# Patient Record
Sex: Female | Born: 1985 | Race: White | Hispanic: No | Smoking: Never smoker
Health system: Southern US, Community
[De-identification: ages and names within clinical notes are randomized; demographics above are authoritative.]

## PROBLEM LIST (undated history)

## (undated) DIAGNOSIS — F53 Postpartum depression: Secondary | ICD-10-CM

## (undated) DIAGNOSIS — O169 Unspecified maternal hypertension, unspecified trimester: Secondary | ICD-10-CM

## (undated) DIAGNOSIS — T7840XA Allergy, unspecified, initial encounter: Secondary | ICD-10-CM

## (undated) DIAGNOSIS — R519 Headache, unspecified: Secondary | ICD-10-CM

## (undated) DIAGNOSIS — M25569 Pain in unspecified knee: Secondary | ICD-10-CM

## (undated) DIAGNOSIS — I10 Essential (primary) hypertension: Secondary | ICD-10-CM

## (undated) DIAGNOSIS — O1490 Unspecified pre-eclampsia, unspecified trimester: Secondary | ICD-10-CM

## (undated) DIAGNOSIS — R112 Nausea with vomiting, unspecified: Secondary | ICD-10-CM

## (undated) DIAGNOSIS — G4700 Insomnia, unspecified: Secondary | ICD-10-CM

## (undated) DIAGNOSIS — N200 Calculus of kidney: Secondary | ICD-10-CM

## (undated) DIAGNOSIS — Z9889 Other specified postprocedural states: Secondary | ICD-10-CM

## (undated) DIAGNOSIS — G4730 Sleep apnea, unspecified: Secondary | ICD-10-CM

## (undated) DIAGNOSIS — Z87442 Personal history of urinary calculi: Secondary | ICD-10-CM

## (undated) DIAGNOSIS — L732 Hidradenitis suppurativa: Secondary | ICD-10-CM

## (undated) DIAGNOSIS — F319 Bipolar disorder, unspecified: Secondary | ICD-10-CM

## (undated) DIAGNOSIS — D649 Anemia, unspecified: Secondary | ICD-10-CM

## (undated) DIAGNOSIS — F419 Anxiety disorder, unspecified: Secondary | ICD-10-CM

## (undated) DIAGNOSIS — E559 Vitamin D deficiency, unspecified: Secondary | ICD-10-CM

## (undated) DIAGNOSIS — F603 Borderline personality disorder: Secondary | ICD-10-CM

## (undated) HISTORY — DX: Unspecified pre-eclampsia, unspecified trimester: O14.90

## (undated) HISTORY — DX: Postpartum depression: F53.0

## (undated) HISTORY — PX: APPENDECTOMY: SHX54

## (undated) HISTORY — PX: OTHER SURGICAL HISTORY: SHX169

## (undated) HISTORY — DX: Anemia, unspecified: D64.9

## (undated) HISTORY — PX: TUBAL LIGATION: SHX77

## (undated) HISTORY — DX: Allergy, unspecified, initial encounter: T78.40XA

## (undated) HISTORY — DX: Unspecified maternal hypertension, unspecified trimester: O16.9

## (undated) HISTORY — DX: Calculus of kidney: N20.0

## (undated) HISTORY — PX: KNEE SURGERY: SHX244

---
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
## 2008-02-11 DIAGNOSIS — O1490 Unspecified pre-eclampsia, unspecified trimester: Secondary | ICD-10-CM

## 2008-04-11 HISTORY — PX: WISDOM TOOTH EXTRACTION: SHX21

## 2008-08-27 ENCOUNTER — Emergency Department (HOSPITAL_COMMUNITY): Admission: EM | Admit: 2008-08-27 | Discharge: 2008-08-27 | Payer: Self-pay | Admitting: Emergency Medicine

## 2010-05-04 LAB — HM PAP SMEAR: HM Pap smear: NEGATIVE

## 2010-07-20 LAB — URINALYSIS, ROUTINE W REFLEX MICROSCOPIC
Hgb urine dipstick: NEGATIVE
Ketones, ur: NEGATIVE mg/dL
Nitrite: NEGATIVE
Specific Gravity, Urine: 1.02 (ref 1.005–1.030)
Urobilinogen, UA: 0.2 mg/dL (ref 0.0–1.0)

## 2010-08-27 NOTE — Op Note (Signed)
   NAME:  STEPHANEY, [name]                           ACCOUNT NO.:  [id number]   MEDICAL RECORD NO.:  [id number]                   PATIENT TYPE:  AMB   LOCATION:  DSC                                  FACILITY:  [facility]   PHYSICIAN:  Thera Flake., M.D.             DATE OF BIRTH:  10/08/1985   DATE OF PROCEDURE:  06/28/2002  DATE OF DISCHARGE:                                 OPERATIVE REPORT   INDICATIONS:  A 25 year old, status post lateral release with persistent  subluxation of her patella, thought to be amenable to overnight  hospitalization and medial plication.   PREOPERATIVE DIAGNOSES:  1. Patellar lateral subluxation.  2. Synovitis.   POSTOPERATIVE DIAGNOSES:  1. Patellar lateral subluxation.  2. Synovitis.   OPERATION:  1. Arthroscopic synovectomy.  2. Open medial plication of right knee.   SURGEON:  Dyke Brackett, M.D.   ANESTHESIA:  General.   DESCRIPTION OF PROCEDURE:  Arthroscope through a superomedial and  inferomedial portal.  The patient had mild synovitis along the  patellofemoral area which was debrided.  The patella itself looked good.  ACL/PCL were normal.  Medial compartment, lateral compartment, menisci were  normal.  She did have mild joint laxity and was thought to have some  characteristics consistent with ligamentive laxity.  The lateral release  seemed to have worked well; however, there was some residual subluxation.  Through a small 5 cm incision, the medial retinaculum and vastus medialis  was taken down over a distance of about 4 cm, embrocated about 1-1.5 cm to  pull the patella medially.  This was done with interrupted mattress sutures  of #2 Ethibond.  Closure was effected with 2-0 Vicryl and Monocryl.  Marcaine with epinephrine infiltrated in the skin and the joint.  Lightly  compressive sterile dressing and knee immobilizer applied, taken to the  recovery room in stable condition.                                               Thera Flake., M.D.    WDC/MEDQ  D:  06/28/2002  T:  06/29/2002  Job:  [id number]
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
## 2010-08-27 NOTE — Op Note (Signed)
[name], [name]                 ACCOUNT NO.:  [id number]   MEDICAL RECORD NO.:  [id number]          PATIENT TYPE:  OBV   LOCATION:  A328                          FACILITY:  [facility]   PHYSICIAN:  Dalia Heading, M.D.  DATE OF BIRTH:  03-05-1986   DATE OF PROCEDURE:  12/30/2004  DATE OF DISCHARGE:                                 OPERATIVE REPORT   PREOPERATIVE DIAGNOSIS:  Chronic appendicitis.   POSTOPERATIVE DIAGNOSIS:  Chronic appendicitis.   PROCEDURE:  Laparoscopic appendectomy.   SURGEON:  Dr. Franky Macho.   ANESTHESIA:  General endotracheal.   INDICATIONS:  The patient is a 25 year old white female who presents with a  several-month history of intermittent right lower quadrant abdominal pain.  She was seen in the emergency room, and a CT scan of the abdomen and pelvis  reveals appendoliths within the appendix, consistent with possible chronic  appendicitis. The patient comes to the operating room for laparoscopic  appendectomy. Risks and benefits of the procedure including bleeding,  infection, and the possibility of open procedure were fully explained to the  patient's mother, who gave informed consent for the patient as the patient  is a minor.   PROCEDURE NOTE:  The patient was placed in the Trendelenburg position. The  abdomen was prepped and draped in the usual sterile technique with Betadine.  Surgical site confirmation was performed.   A supraumbilical incision was made down to the fascia. A Veress needle was  introduced into the abdominal cavity, and confirmation of placement was done  using the saline drop test. The abdomen was then insufflated to 16 mmHg  pressure. An 11-mm trocar was introduced into the abdominal cavity under  direct visualization without difficulty. An additional 12-mm trocar was  placed in the suprapubic region. A 5-mm trocar was placed in left lower  quadrant region. The appendix was visualized and noted to be somewhat  dilated and  injected. The uterus and ovaries were within normal limits.  There was no other pathology found in the right lower quadrant. The  mesoappendix was divided using the harmonic scalpel. A vascular Endo-GIA was  placed across the base of the appendix and fired. The staple line was  intact. The appendix was removed using an EndoCatch bag without difficulty.  All fluid and air were then evacuate from the abdominal cavity prior to  removal of the trocars.   All wounds were irrigated normal saline. All wounds were injected with 0.5%  Sensorcaine. The suprapubic fascia was reapproximated using a 0 Vicryl  interrupted suture. All skin incisions were closed using staples. Betadine  ointment and dry sterile dressings were applied.   All tape and needle counts were correct at the end of the procedure. The  patient was extubated in the operating room and went back to recovery room  awake in stable condition.   COMPLICATIONS:  None.   SPECIMEN:  Appendix.   BLOOD LOSS:  Minimal.      Dalia Heading, M.D.  Electronically Signed     MAJ/MEDQ  D:  12/30/2004  T:  12/30/2004  Job:  [id number] 

## 2010-08-27 NOTE — H&P (Signed)
[name], [name]                 ACCOUNT NO.:  [id number]   MEDICAL RECORD NO.:  [id number]          PATIENT TYPE:  OBV   LOCATION:  A328                          FACILITY:  [facility]   PHYSICIAN:  Dalia Heading, M.D.  DATE OF BIRTH:  [birth year]   DATE OF ADMISSION:  12/29/2004  DATE OF DISCHARGE:  LH                                HISTORY & PHYSICAL   CHIEF COMPLAINT:  Right lower quadrant abdominal pain.   HISTORY OF PRESENT ILLNESS:  The patient is a 25 year old white female who  presents to the emergency room with worsening right lower quadrant abdominal  pain.  She has had multiple episodes of right lower quadrant abdominal pain  since May 2006.  She has been seen by urology and other physicians in the  past.  A CT scan of the abdomen and pelvis was performed in the emergency  room which revealed appendoliths, consistent with probable chronic  appendicitis.  She has mild nausea, but no vomiting.   PAST MEDICAL HISTORY:  As noted above.   PAST SURGICAL HISTORY:  Multiple knee surgeries.   CURRENT MEDICATIONS:  1.  Zyrtec.  2.  Ortho-Novum.  3.  Vicodin.   ALLERGIES:  No known drug allergies.   REVIEW OF SYSTEMS:  Noncontributory.   PHYSICAL EXAMINATION:  GENERAL:  The patient is a well-developed, well-  nourished white female in no acute distress.  VITAL SIGNS:  She is afebrile, vital signs are stable.  LUNGS:  Clear to auscultation with equal breath sounds bilaterally.  HEART:  Regular rate and rhythm without S3, S4, or murmurs.  ABDOMEN:  Soft with tenderness down the right lower quadrant to palpation.  No hepatosplenomegaly, masses, or hernias are identified.  No rigidity is  noted.   LABORATORY DATA:  CBC within normal limits.  Met-7 within normal limits.  Urine pregnancy test is negative.   IMPRESSION:  Right lower quadrant abdominal pain, recurrent, consistent with  recurrent chronic appendicitis with fecaliths.   PLAN:  The patient is going to be admitted to  the hospital for pain and  nausea control.  She will also undergo a laparoscopic appendectomy.  The  risks and benefits of the procedure, including bleeding, infection, and the  possibility of an open procedure were fully explained to the patient, who  gave informed consent.      Dalia Heading, M.D.  Electronically Signed     MAJ/MEDQ  D:  12/29/2004  T:  12/29/2004  Job:  [id number]   cc:   Short Stay at [facility]
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
## 2010-08-27 NOTE — Op Note (Signed)
NAME:  [name], [name]                           ACCOUNT NO.:  [id number]   MEDICAL RECORD NO.:  [id number]                   PATIENT TYPE:  AMB   LOCATION:  DSC                                  FACILITY:  [facility]   PHYSICIAN:  Thera Flake., M.D.             DATE OF BIRTH:  Aug 22, 1985   DATE OF PROCEDURE:  12/28/2001  DATE OF DISCHARGE:  12/28/2001                                 OPERATIVE REPORT   PREOPERATIVE DIAGNOSIS:  Lateral patellar subluxation.   POSTOPERATIVE DIAGNOSES:  1. Lateral patellar subluxation.  2. Lateral meniscus tear.   PROCEDURES:  1. Partial lateral meniscectomy.  2. Arthroscopic lateral release.   SURGEON:  Dyke Brackett, M.D.   ANESTHESIA:  General.   TOURNIQUET TIME:  Approximately 15 minutes.   INDICATIONS:  This is a 25 year old with a history of patellar subluxation  thought to be amenable to arthroscopy release, possible medial plication.   DESCRIPTION OF PROCEDURE:  She was arthroscoped through an inferior medial  and inferior lateral superomedial portal.  There was a small fraying-type  tear of the lateral meniscus, possibly thought to be responsible for some  symptoms that the patient was having.  This was unexpected based on the  absence of joint line tenderness.  This was estimated at about 10-15% of the  meniscus substance that was resected.  There was a large popliteal hiatus,  but the tear itself was more of a fraying-type tear along the thinnest  margin of the meniscus.  The medial meniscus, ACL, and PCL were normal.  The  patella showed some mild overhang, about one-third the diameter of the  patella.  Exam under anesthesia showed no real frank tendency to dislocate.  Preoperative patellar view was normal.  There was no significant chondral  injury relative to the patient's history.  Again it was equivocal based on  the absence of any objective x-ray or ER data.  The patient had been  suffering a true dislocation versus a  subluxation.  In the absence of  significant intraoperative findings consistent with significant trauma and  the fact that the patellar view was normal, it was elected to do a lateral  release without a medial plication.  This was accomplished with the  ArthroCare wand.  It relieved pressure in the overhang at least by 50%.  The  portals were removed.  The arthroscopy of the inferolateral portal had been  slightly increased in size.  It was closed with two stitches, followed by  one stitch each on the other two portals, infiltrated with Marcaine and  morphine into the joint, into the lateral release site.  A lightly  compressive sterile dressing applied and a knee immobilizer.  Taken to the  recovery room in stable condition.  Thera Flake., M.D.    WDC/MEDQ  D:  12/28/2001  T:  12/31/2001  Job:  [id number]

## 2012-01-01 ENCOUNTER — Encounter (HOSPITAL_COMMUNITY): Payer: Self-pay

## 2012-01-01 ENCOUNTER — Emergency Department (HOSPITAL_COMMUNITY): Payer: Medicaid Other

## 2012-01-01 ENCOUNTER — Emergency Department (HOSPITAL_COMMUNITY)
Admission: EM | Admit: 2012-01-01 | Discharge: 2012-01-01 | Disposition: A | Discharge status: Home / Self Care | Payer: Medicaid Other | Attending: Emergency Medicine | Admitting: Emergency Medicine

## 2012-01-01 DIAGNOSIS — IMO0002 Reserved for concepts with insufficient information to code with codable children: Secondary | ICD-10-CM | POA: Insufficient documentation

## 2012-01-01 DIAGNOSIS — S86911A Strain of unspecified muscle(s) and tendon(s) at lower leg level, right leg, initial encounter: Secondary | ICD-10-CM

## 2012-01-01 HISTORY — DX: Pain in unspecified knee: M25.569

## 2012-01-01 MED ORDER — HYDROCODONE-ACETAMINOPHEN 5-325 MG PO TABS
1.0000 | ORAL_TABLET | Freq: Once | ORAL | Status: AC
  Administered 2012-01-01: 1 via ORAL
  Filled 2012-01-01: qty 1

## 2012-01-01 MED ORDER — HYDROCODONE-ACETAMINOPHEN 5-325 MG PO TABS: 1.0000 | ORAL_TABLET | ORAL | Status: DC | PRN

## 2012-01-01 NOTE — ED Notes (Signed)
Pt states that her right knee "popped" out of place this past Thursday, pt was sitting in chair when incident occurred, denies any injury, was able to get the knee "popped" back into place, still continues to have pain. Cms intact distal,

## 2012-01-01 NOTE — ED Notes (Signed)
Pt reports right knee surgery several years ago, cont. To have problems w/ knee "slipping out", slipped out on Thursday and she now cont. To have pain.
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
## 2012-01-05 NOTE — ED Provider Notes (Signed)
Medical screening examination/treatment/procedure(s) were performed by non-physician practitioner and as supervising physician I was immediately available for consultation/collaboration.   Glynn Octave, MD 01/05/12 1041

## 2013-02-21 ENCOUNTER — Encounter (HOSPITAL_COMMUNITY): Payer: Self-pay | Admitting: Emergency Medicine

## 2013-02-21 ENCOUNTER — Emergency Department (HOSPITAL_COMMUNITY)
Admission: EM | Admit: 2013-02-21 | Discharge: 2013-02-21 | Disposition: A | Discharge status: Home / Self Care | Payer: Medicaid Other | Attending: Emergency Medicine | Admitting: Emergency Medicine

## 2013-02-21 DIAGNOSIS — L299 Pruritus, unspecified: Secondary | ICD-10-CM | POA: Insufficient documentation

## 2013-02-21 DIAGNOSIS — R300 Dysuria: Secondary | ICD-10-CM | POA: Insufficient documentation

## 2013-02-21 DIAGNOSIS — N898 Other specified noninflammatory disorders of vagina: Secondary | ICD-10-CM | POA: Insufficient documentation

## 2013-02-21 LAB — URINALYSIS, ROUTINE W REFLEX MICROSCOPIC
Nitrite: NEGATIVE
Protein, ur: NEGATIVE mg/dL
Specific Gravity, Urine: 1.02 (ref 1.005–1.030)
Urobilinogen, UA: 0.2 mg/dL (ref 0.0–1.0)

## 2013-02-21 MED ORDER — FLUCONAZOLE 100 MG PO TABS
200.0000 mg | ORAL_TABLET | Freq: Once | ORAL | Status: AC
  Administered 2013-02-21: 200 mg via ORAL
  Filled 2013-02-21: qty 2

## 2013-02-21 MED ORDER — NYSTATIN-TRIAMCINOLONE 100000-0.1 UNIT/GM-% EX CREA: TOPICAL_CREAM | CUTANEOUS | Status: DC

## 2013-02-21 NOTE — ED Provider Notes (Signed)
CSN: [id number]     Arrival date & time 02/21/13  7829 History   First MD Initiated Contact with Patient 02/21/13 0920     No chief complaint on file.  (Consider location/radiation/quality/duration/timing/severity/associated sxs/prior Treatment) Patient is a 27 y.o. female presenting with vaginal discharge. The history is provided by the patient.  Vaginal Discharge Quality:  White and thin Onset quality:  Gradual Duration:  8 days Timing:  Constant Progression:  Worsening Chronicity:  New Context: after intercourse   Relieved by:  Nothing Ineffective treatments:  OTC medications Associated symptoms: dysuria and vaginal itching   Associated symptoms: no abdominal pain, no dyspareunia, no fever, no genital lesions, no nausea, no rash, no urinary frequency, no urinary hesitancy, no urinary incontinence and no vomiting   Risk factors: gynecological surgery and new sexual partner   Risk factors: no endometriosis, no immunosuppression, no PID, no STI exposure and no unprotected sex    MELVIN MARMO is a 27 y.o. female who presents to the ED with vaginal discharge that started last week after using a condom and having sex with a new partner. She has had irritation from condoms in the past but usually clears up in a day or so but not this time. Yesterday began having burning on urination in addition to the discharge. Used Azo but no relief.   Past Medical History  Diagnosis Date  . Knee pain    Past Surgical History  Procedure Laterality Date  . Knee surgery    . Appendectomy    . Cesarean section     No family history on file. History  Substance Use Topics  . Smoking status: Never Smoker   . Smokeless tobacco: Not on file  . Alcohol Use: No   OB History   Grav Para Term Preterm Abortions TAB SAB Ect Mult Living                 Review of Systems  Constitutional: Negative for fever and chills.  HENT: Negative.   Eyes: Negative for redness.  Respiratory: Negative for cough  and shortness of breath.   Cardiovascular: Negative for chest pain.  Gastrointestinal: Negative for nausea, vomiting and abdominal pain.  Genitourinary: Positive for dysuria and vaginal discharge. Negative for bladder incontinence, hesitancy and dyspareunia. Vaginal pain: itching.  Musculoskeletal: Negative for back pain.  Skin: Negative for rash.  Neurological: Negative for dizziness and headaches.  Psychiatric/Behavioral: The patient is not nervous/anxious.     Allergies  Review of patient's allergies indicates no known allergies.  Home Medications   Current Outpatient Rx  Name  Route  Sig  Dispense  Refill  . HYDROcodone-acetaminophen (NORCO/VICODIN) 5-325 MG per tablet   Oral   Take 1 tablet by mouth every 4 (four) hours as needed for pain.   20 tablet   0   . ibuprofen (ADVIL,MOTRIN) 200 MG tablet   Oral   Take 800 mg by mouth every 6 (six) hours as needed. For pain          There were no vitals taken for this visit. Physical Exam  Nursing note and vitals reviewed. Constitutional: She is oriented to person, place, and time. She appears well-developed and well-nourished.  HENT:  Head: Normocephalic and atraumatic.  Eyes: Conjunctivae and EOM are normal.  Neck: Neck supple.  Cardiovascular: Normal rate.   Pulmonary/Chest: Effort normal.  Abdominal: Soft. There is no tenderness. There is no CVA tenderness.  Genitourinary:  External genitalia with erythema and irritation. Thick white discharge  vaginal vault. No CMT, no adnexal tenderness, uterus without palpable enlargement.   Musculoskeletal: Normal range of motion.  Neurological: She is alert and oriented to person, place, and time. No cranial nerve deficit.  Skin: Skin is warm and dry.  Psychiatric: She has a normal mood and affect. Her behavior is normal.   Results for orders placed during the hospital encounter of 02/21/13 (from the past 24 hour(s))  URINALYSIS, ROUTINE W REFLEX MICROSCOPIC     Status: None    Collection Time    02/21/13  9:46 AM      Result Value Range   Color, Urine YELLOW  YELLOW   APPearance CLEAR  CLEAR   Specific Gravity, Urine 1.020  1.005 - 1.030   pH 6.5  5.0 - 8.0   Glucose, UA NEGATIVE  NEGATIVE mg/dL   Hgb urine dipstick NEGATIVE  NEGATIVE   Bilirubin Urine NEGATIVE  NEGATIVE   Ketones, ur NEGATIVE  NEGATIVE mg/dL   Protein, ur NEGATIVE  NEGATIVE mg/dL   Urobilinogen, UA 0.2  0.0 - 1.0 mg/dL   Nitrite NEGATIVE  NEGATIVE   Leukocytes, UA NEGATIVE  NEGATIVE  WET PREP, GENITAL     Status: Abnormal   Collection Time    02/21/13  9:46 AM      Result Value Range   Yeast Wet Prep HPF POC NONE SEEN  NONE SEEN   Trich, Wet Prep NONE SEEN  NONE SEEN   Clue Cells Wet Prep HPF POC NONE SEEN  NONE SEEN   WBC, Wet Prep HPF POC TOO NUMEROUS TO COUNT (*) NONE SEEN    ED Course  Procedures   MDM  28 y.o. female with vaginal irritation, discharge and dysuria. Will treat with Diflucan and Mycolog cream. Patient to follow up with her GYN or return here as needed.  Discussed with the patient clinical findings and plan of care. All questioned fully answered.   Medication List    TAKE these medications       nystatin-triamcinolone cream  Commonly known as:  MYCOLOG II  Apply to affected area daily      ASK your doctor about these medications       HYDROcodone-acetaminophen 5-325 MG per tablet  Commonly known as:  NORCO/VICODIN  Take 1 tablet by mouth every 4 (four) hours as needed for pain.     ibuprofen 200 MG tablet  Commonly known as:  ADVIL,MOTRIN  Take 800 mg by mouth every 6 (six) hours as needed. For pain            Janne Napoleon, NP 02/21/13 1413

## 2013-02-21 NOTE — ED Notes (Signed)
White Vaginal discharge starting yesterday with painful urination.  Reports has allergy to condoms and used condom on Tuesday and sx began on Wednesday.

## 2013-02-21 NOTE — ED Provider Notes (Signed)
Medical screening examination/treatment/procedure(s) were performed by non-physician practitioner and as supervising physician I was immediately available for consultation/collaboration.  EKG Interpretation   None       Devoria Albe, MD, Armando Gang   Ward Givens, MD 02/21/13 [phone]

## 2014-09-11 ENCOUNTER — Other Ambulatory Visit: Payer: Self-pay | Admitting: Nurse Practitioner

## 2014-09-11 DIAGNOSIS — R102 Pelvic and perineal pain: Secondary | ICD-10-CM

## 2014-09-12 ENCOUNTER — Other Ambulatory Visit: Payer: Self-pay

## 2014-09-17 ENCOUNTER — Ambulatory Visit
Admission: RE | Admit: 2014-09-17 | Discharge: 2014-09-17 | Disposition: A | Discharge status: Home / Self Care | Payer: No Typology Code available for payment source | Source: Ambulatory Visit | Attending: Nurse Practitioner | Admitting: Nurse Practitioner

## 2014-09-17 DIAGNOSIS — R102 Pelvic and perineal pain: Secondary | ICD-10-CM

## 2015-09-17 ENCOUNTER — Emergency Department (HOSPITAL_COMMUNITY): Payer: Medicaid Other

## 2015-09-17 ENCOUNTER — Encounter (HOSPITAL_COMMUNITY): Payer: Self-pay

## 2015-09-17 ENCOUNTER — Emergency Department (HOSPITAL_COMMUNITY)
Admission: EM | Admit: 2015-09-17 | Discharge: 2015-09-17 | Disposition: A | Discharge status: Home / Self Care | Payer: Medicaid Other | Attending: Emergency Medicine | Admitting: Emergency Medicine

## 2015-09-17 DIAGNOSIS — I10 Essential (primary) hypertension: Secondary | ICD-10-CM | POA: Diagnosis not present

## 2015-09-17 DIAGNOSIS — Z7982 Long term (current) use of aspirin: Secondary | ICD-10-CM | POA: Insufficient documentation

## 2015-09-17 DIAGNOSIS — Z79899 Other long term (current) drug therapy: Secondary | ICD-10-CM | POA: Insufficient documentation

## 2015-09-17 DIAGNOSIS — R519 Headache, unspecified: Secondary | ICD-10-CM

## 2015-09-17 DIAGNOSIS — R202 Paresthesia of skin: Secondary | ICD-10-CM | POA: Diagnosis not present

## 2015-09-17 DIAGNOSIS — Z3A14 14 weeks gestation of pregnancy: Secondary | ICD-10-CM | POA: Insufficient documentation

## 2015-09-17 DIAGNOSIS — Z791 Long term (current) use of non-steroidal anti-inflammatories (NSAID): Secondary | ICD-10-CM | POA: Diagnosis not present

## 2015-09-17 DIAGNOSIS — R51 Headache: Secondary | ICD-10-CM | POA: Diagnosis not present

## 2015-09-17 DIAGNOSIS — O26891 Other specified pregnancy related conditions, first trimester: Secondary | ICD-10-CM | POA: Diagnosis present

## 2015-09-17 DIAGNOSIS — Z3490 Encounter for supervision of normal pregnancy, unspecified, unspecified trimester: Secondary | ICD-10-CM

## 2015-09-17 HISTORY — DX: Essential (primary) hypertension: I10

## 2015-09-17 LAB — URINE MICROSCOPIC-ADD ON

## 2015-09-17 LAB — URINALYSIS, ROUTINE W REFLEX MICROSCOPIC
Bilirubin Urine: NEGATIVE
GLUCOSE, UA: NEGATIVE mg/dL
Ketones, ur: NEGATIVE mg/dL
Nitrite: NEGATIVE
PH: 6 (ref 5.0–8.0)
Protein, ur: NEGATIVE mg/dL
Specific Gravity, Urine: 1.015 (ref 1.005–1.030)

## 2015-09-17 LAB — COMPREHENSIVE METABOLIC PANEL
ALK PHOS: 44 U/L (ref 38–126)
ALT: 20 U/L (ref 14–54)
AST: 17 U/L (ref 15–41)
Albumin: 3.6 g/dL (ref 3.5–5.0)
Anion gap: 5 (ref 5–15)
BUN: <5 mg/dL — ABNORMAL LOW (ref 6–20)
CALCIUM: 9 mg/dL (ref 8.9–10.3)
CO2: 22 mmol/L (ref 22–32)
Chloride: 107 mmol/L (ref 101–111)
Creatinine, Ser: 0.51 mg/dL (ref 0.44–1.00)
GFR calc non Af Amer: >60 mL/min (ref >60)
GLUCOSE: 72 mg/dL (ref 65–99)
POTASSIUM: 3.7 mmol/L (ref 3.5–5.1)
SODIUM: 134 mmol/L — AB (ref 135–145)
Total Bilirubin: 0.3 mg/dL (ref 0.3–1.2)
Total Protein: 7 g/dL (ref 6.5–8.1)

## 2015-09-17 LAB — CBC WITH DIFFERENTIAL/PLATELET
Basophils Absolute: 0 10*3/uL (ref 0.0–0.1)
Basophils Relative: 0 %
Eosinophils Absolute: 0 10*3/uL (ref 0.0–0.7)
Eosinophils Relative: 1 %
HEMATOCRIT: 35.2 % — AB (ref 36.0–46.0)
HEMOGLOBIN: 12.2 g/dL (ref 12.0–15.0)
LYMPHS ABS: 1.8 10*3/uL (ref 0.7–4.0)
LYMPHS PCT: 33 %
MCH: 31.9 pg (ref 26.0–34.0)
MCHC: 34.7 g/dL (ref 30.0–36.0)
MCV: 92.1 fL (ref 78.0–100.0)
Monocytes Absolute: 0.4 10*3/uL (ref 0.1–1.0)
Monocytes Relative: 6 %
NEUTROS ABS: 3.4 10*3/uL (ref 1.7–7.7)
NEUTROS PCT: 60 %
Platelets: 287 10*3/uL (ref 150–400)
RBC: 3.82 MIL/uL — AB (ref 3.87–5.11)
RDW: 13.2 % (ref 11.5–15.5)
WBC: 5.6 10*3/uL (ref 4.0–10.5)

## 2015-09-17 LAB — TSH: TSH: 0.818 u[IU]/mL (ref 0.350–4.500)

## 2015-09-17 LAB — SEDIMENTATION RATE: Sed Rate: 32 mm/hr — ABNORMAL HIGH (ref 0–22)

## 2015-09-17 LAB — PROTEIN / CREATININE RATIO, URINE
Creatinine, Urine: 124.16 mg/dL
PROTEIN CREATININE RATIO: 0.13 mg/mg{creat} (ref 0.00–0.15)
Total Protein, Urine: 16 mg/dL

## 2015-09-17 MED ORDER — PROCHLORPERAZINE EDISYLATE 5 MG/ML IJ SOLN
10.0000 mg | Freq: Once | INTRAMUSCULAR | Status: AC
  Administered 2015-09-17: 10 mg via INTRAVENOUS
  Filled 2015-09-17: qty 2

## 2015-09-17 MED ORDER — DIPHENHYDRAMINE HCL 50 MG/ML IJ SOLN
25.0000 mg | Freq: Once | INTRAMUSCULAR | Status: AC
Start: 2015-09-17 — End: 2015-09-17
  Administered 2015-09-17: 25 mg via INTRAVENOUS
  Filled 2015-09-17: qty 1

## 2015-09-17 MED ORDER — NITROFURANTOIN MONOHYD MACRO 100 MG PO CAPS: 100.0000 mg | ORAL_CAPSULE | Freq: Two times a day (BID) | ORAL | Status: DC

## 2015-09-17 MED ORDER — TROPICAMIDE 1 % OP SOLN
1.0000 [drp] | Freq: Once | OPHTHALMIC | Status: DC
  Filled 2015-09-17: qty 3

## 2015-09-17 MED ORDER — METOCLOPRAMIDE HCL 5 MG/ML IJ SOLN
10.0000 mg | Freq: Once | INTRAMUSCULAR | Status: AC
  Administered 2015-09-17: 10 mg via INTRAVENOUS
  Filled 2015-09-17: qty 2

## 2015-09-17 NOTE — ED Provider Notes (Signed)
CSN: [id number]     Arrival date & time 09/17/15  1038 History   By signing my name below, I, Evon Slackerrance Branch, attest that this documentation has been prepared under the direction and in the presence of Glynn OctaveStephen Necia Kamm, MD. Electronically Signed: Evon Slackerrance Branch, ED Scribe. 09/17/2015. 11:38 AM.    No chief complaint on file.  The history is provided by the patient. No language interpreter was used.   HPI Comments: [name] is a 30 y.o. female who presents to the Emergency Department complaining of throbbing right sided HA onset 1 day prior. Pt reports that she is seeing black "squiggly lines" in her vision.  Denies loss of vision. Headache is diffuse and gradual in onset.  Pt reports that she has been having intermittent numbness on the right side of her body that began 6 days prior. Had episode six days ago that lasted several hours and another episode yesterday that lasted all day. Pt states that she was [facility]. She states that she had an MRI completed 1 day prior that showed no abnormalities. Pt states that she is [redacted] weeks pregnant. Pt states that she had the baby evaluated yesterday at [facility]. Pt states that her BP has been fluctuating as well. Pt states that her BP will fluctuate from 140/91 and 105/60. Pt states that she has a hx of preeclampsia and that her symptoms feel similar. Pt also reports some dysuria. Pt deosnt report any medications PTA. Pt doesn't report any alleviating or worsening factors. Denies vaginal bleeding, vaginal discharge, abdominal pain CP or SOB. Pt denies numbness today.    Past Medical History  Diagnosis Date  . Knee pain   . Hypertension    Past Surgical History  Procedure Laterality Date  . Knee surgery    . Appendectomy    . Cesarean section     No family history on file. Social History  Substance Use Topics  . Smoking status: Never Smoker   . Smokeless tobacco: None  . Alcohol Use: No   OB History    Gravida Para Term  Preterm AB TAB SAB Ectopic Multiple Living   1               Review of Systems  Eyes: Positive for visual disturbance.  Respiratory: Negative for shortness of breath.   Cardiovascular: Negative for chest pain.  Gastrointestinal: Negative for abdominal pain.  Genitourinary: Negative for vaginal bleeding and vaginal discharge.  Neurological: Positive for headaches.  All other systems reviewed and are negative.     Allergies  Keflex and Latex  Home Medications   Prior to Admission medications   Medication Sig Start Date End Date Taking? Authorizing Provider  HYDROcodone-acetaminophen (NORCO/VICODIN) 5-325 MG per tablet Take 1 tablet by mouth every 4 (four) hours as needed for pain. 01/01/12   Burgess AmorJulie Idol, PA-C  ibuprofen (ADVIL,MOTRIN) 200 MG tablet Take 800 mg by mouth every 6 (six) hours as needed. For pain    Historical Provider, MD  nystatin-triamcinolone (MYCOLOG II) cream Apply to affected area daily 02/21/13   Hope Orlene OchM Neese, NP   BP 116/75 mmHg  Pulse 83  Temp(Src) 99 F (37.2 C) (Tympanic)  Resp 18  Ht 5\' 6"  (1.676 m)  Wt 220 lb (99.791 kg)  BMI 35.53 kg/m2  SpO2 100%  LMP 06/13/2015   Physical Exam  Constitutional: She is oriented to person, place, and time. She appears well-developed and well-nourished. No distress.  HENT:  Head: Normocephalic and atraumatic.  Mouth/Throat: Oropharynx is clear and moist. No oropharyngeal exudate.  Eyes: Conjunctivae and EOM are normal. Pupils are equal, round, and reactive to light. Right eye exhibits no nystagmus. Left eye exhibits no nystagmus.  Fundoscopic exam:      The right eye shows no papilledema.       The left eye shows no papilledema.  No appreciable papilledema on limited exam  Neck: Normal range of motion. Neck supple.  No meningismus.  Cardiovascular: Normal rate, regular rhythm, normal heart sounds and intact distal pulses.   No murmur heard. Pulmonary/Chest: Effort normal and breath sounds normal. No  respiratory distress.  Abdominal: Soft. There is no tenderness. There is no rebound and no guarding.  Gravid abdomen.  Musculoskeletal: Normal range of motion. She exhibits no edema or tenderness.  Neurological: She is alert and oriented to person, place, and time. No cranial nerve deficit. She exhibits normal muscle tone. Coordination normal.  No ataxia on finger to nose bilaterally. No pronator drift. 5/5 strength throughout. CN 2-12 intact.Equal grip strength. Sensation intact throughout.  No visual field cut. No nystagmus.  Skin: Skin is warm.  Psychiatric: She has a normal mood and affect. Her behavior is normal.  Nursing note and vitals reviewed.   ED Course  Procedures (including critical care time) DIAGNOSTIC STUDIES: Oxygen Saturation is 100% on RA, normal by my interpretation.    COORDINATION OF CARE: 11:38 AM-Discussed treatment plan with pt at bedside and pt agreed to plan.     Labs Review Labs Reviewed  URINALYSIS, ROUTINE W REFLEX MICROSCOPIC (NOT AT [facility]) - Abnormal; Notable for the following:    APPearance HAZY (*)    Hgb urine dipstick TRACE (*)    Leukocytes, UA SMALL (*)    All other components within normal limits  URINE MICROSCOPIC-ADD ON - Abnormal; Notable for the following:    Squamous Epithelial / LPF 0-5 (*)    Bacteria, UA MANY (*)    All other components within normal limits  PROTEIN / CREATININE RATIO, URINE  CBC WITH DIFFERENTIAL/PLATELET  COMPREHENSIVE METABOLIC PANEL    Imaging Review Mr Angiogram Head Wo Contrast  09/17/2015  CLINICAL DATA:  30 year old female with right face and upper extremity numbness. Speech and visual disturbance. Initial encounter. EXAM: MRA HEAD WITHOUT CONTRAST TECHNIQUE: Angiographic images of the Circle of Willis were obtained using MRA technique without intravenous contrast. COMPARISON:  Intracranial MRA 1202 hours today. Brain MRI 09/16/2015. Neck MRA from today reported separately. FINDINGS: Antegrade flow in  codominant distal vertebral arteries which are tortuous but otherwise normal to the vertebrobasilar junction. Normal right PICA and dominant appearing left AICA origins. Mildly tortuous proximal basilar artery. Otherwise normal basilar artery. Normal SCA and PCA origins. Posterior communicating arteries are diminutive or absent. Bilateral PCA branches are normal. Antegrade flow in both ICA siphons. No siphon stenosis. Ophthalmic artery origins are normal. Carotid termini are normal. MCA and ACA origins are normal. Anterior communicating artery and visualized ACA branches are within normal limits. MCA M1 segments, bifurcations, and visualized MCA branches are within normal limits. Incidental are early right M1 bifurcation (normal variant). No intracranial mass effect or ventriculomegaly. Normal cerebral volume. IMPRESSION: Negative intracranial MRA. Electronically Signed   By: Odessa Fleming M.D.   On: 09/17/2015 16:12   Mr Angiogram Neck Wo Contrast  09/17/2015  CLINICAL DATA:  30 year old female with right face and upper extremity numbness. Speech and visual disturbance. Initial encounter. EXAM: MRA NECK WITHOUT CONTRAST TECHNIQUE: Angiographic images of the neck were obtained  using MRA technique without intravenous contrast. Carotid stenosis measurements (when applicable) are obtained utilizing NASCET criteria, using the distal internal carotid diameter as the denominator. COMPARISON:  Intracranial MRV 1202 hours today. Brain MRI 09/16/2015. Intracranial MRA today reported separately. FINDINGS: 3D time-of-flight imaging in the neck reveals antegrade flow in both carotid and vertebral arteries from the aortic arch to the skullbase. Three vessel arch configuration. Great vessel origins appear normal. Codominant vertebral arteries appear normal to the skullbase. Bilateral common carotid arteries, carotid bifurcations and cervical ICAs are normal. Grossly negative visualized neck soft tissues; there is mild thyromegaly  with no discrete thyroid nodule evident. IMPRESSION: Negative noncontrast neck MRA. Electronically Signed   By: Odessa Fleming M.D.   On: 09/17/2015 16:09   Mr Alexandria Lodge  09/17/2015  CLINICAL DATA:  30 year old female with right face and upper extremity numbness. Speech and visual disturbance. Initial encounter. EXAM: MR VENOGRAM of the HEAD WITHOUT CONTRAST TECHNIQUE: Angiographic images of the intracranial venous structures were obtained using MRV technique without intravenous contrast. COMPARISON:  Brain MRI without contrast 09/16/2015. FINDINGS: Preserved flow signal in the superior sagittal sinus, torcula, straight sinus, and vein of Galen. Dominant appearing right transverse sinus, but preserved flow signal in both transverse and sigmoid sinuses. Preserved flow signal in the dominant right IJ bulb. IMPRESSION: Negative intracranial MRV; no evidence of dural sinus thrombosis. Electronically Signed   By: Odessa Fleming M.D.   On: 09/17/2015 12:47   I have personally reviewed and evaluated these images and lab results as part of my medical decision-making.   EKG Interpretation None      MDM   Final diagnoses:  Headache  Paresthesia  Pregnancy  Patient states she is [redacted] weeks pregnant by ultrasound and dates. Developed right-sided numbness 6 days ago which improved but then recurred yesterday. Was seen at outside hospital yesterday with negative MRI. Called her OB this morning with worsening headache and vision changes. She was seen at the [facility] clinic yesterday and told the baby was okay.  Neuro exam today is nonfocal.  C/o headache and blurry vision. No fever. BP controlled in the ED.  MRI report received from [facility] yesterday. Normal MRI of the brain. No evidence of infarction or demyelinating disease.  D/w Dr. Vincenza Hews of [facility] at [facility].  Dr. Vincenza Hews agrees the patient does not have preeclampsia. She is less than 20 weeks. Her blood pressure is normal in the ED.  Laboratory studies are normal. There is no proteinuria. Urine protein creatinine ratio is normal.  D/w Dr. Amada Jupiter of neurology.  Recommends treatment for complicated migraine. Also recommends teleneurology assessment.  Does not recommend further imaging. States pseudotumor cerebri should be considered but if headache resolved with migraine cocktail, that is reassuring.  Neurology consult complete by Dr. Dareen Piano. He recommends dilated funduscopic exam assessing for hemorrhage. He also recommends MRA of head and neck. He was requesting additional lab work that was ordered. He does not feel lumbar puncture is indicated and does not feel presentation is consistent with pseudotumor cerebri at this time unless patient has obvious papilledema on exam.  MRA negative.  MRV negative. Headache and blurry vision has resolved with reglan and benadryl.  No obvious papilledema on exam, but limited even with dilation. Patient feels back to baseline, headache has resolved and vision is normal.   Advised close followup with OB and neurology. Would also benefit from formal ophthalmology evaluation. Urine culture sent.  Treat possible UTI.  BP controlled in the  ED. Labs reassuring. No evidence of preeclampsia, CVA, demyelinating disease.   EMERGENCY DEPARTMENT [place] PREGNANCY "Study: Limited Ultrasound of the Pelvis"  INDICATIONS:pregnant Multiple views of the uterus and pelvic cavity are obtained with a multi-frequency probe.  APPROACH:transabdominal  PERFORMED BY: myself  IMAGES ARCHIVED?: yes  LIMITATIONS: body habitus  PREGNANCY FREE FLUID: none  PREGNANCY UTERUS FINDINGS: IUP, +fetal movement, +FHT  PREGNANCY FINDINGS: IUP  INTERPRETATION: IUP, +fetal movement.  FHR difficult to calculate but fluttering seen.Marland Kitchen approx 167 bpm.      I personally performed the services described in this documentation, which was scribed in my presence. The recorded information has been reviewed and is  accurate.       Glynn Octave, MD 09/17/15 [phone]

## 2015-09-17 NOTE — ED Notes (Signed)
Pt reports had numbness on r side of body Friday and went to [facility] er.  Pt says was told she may have had a TIA but did not do a ct scan because pt pregnant and no one was there to do an MRI.  Yesterday morning around 0730 she started having numbness in r side of her face.  She says her bp was 128/108.  Pt called ems for eval then she drove to [facility] and was able to get the MRI.  Pt says the MRI was normal.  Pt says she has been having headaches and her bp is fluctuating.   Pt called her OBGYN and they told her to go to [facility] yesterday.  Reports they checked the baby out but pt did not want to wait in the ER for her to be evaluated because it was a 5 hour wait.  Pt says today she isn't numb but has a headache, blurred vision, and is seeing spots.

## 2015-09-17 NOTE — ED Notes (Signed)
Unable to assess FHT with doppler, pt reports only the Doctor has been able to find them with the ultrasound due to her size.  Bedside ultrasound used and found briefly, but not sustained enough to count.  Good fetal movement shown on ultrasound.  Dr. Manus Gunningancour made aware and states he will attempt to assess.

## 2015-09-17 NOTE — Discharge Instructions (Signed)
Paresthesia Followup with the neurologist and your OB. Also follow up with your eye doctor for a formal eye exam. Return to the ED if you develop new or worsening symptoms. Paresthesia is an abnormal burning or prickling sensation. This sensation is generally felt in the hands, arms, legs, or feet. However, it may occur in any part of the body. Usually, it is not painful. The feeling may be described as:  Tingling or numbness.  Pins and needles.  Skin crawling.  Buzzing.  Limbs falling asleep.  Itching. Most people experience temporary (transient) paresthesia at some time in their lives. Paresthesia may occur when you breathe too quickly (hyperventilation). It can also occur without any apparent cause. Commonly, paresthesia occurs when pressure is placed on a nerve. The sensation quickly goes away after the pressure is removed. For some people, however, paresthesia is a long-lasting (chronic) condition that is caused by an underlying disorder. If you continue to have paresthesia, you may need further medical evaluation. HOME CARE INSTRUCTIONS Watch your condition for any changes. Taking the following actions may help to lessen any discomfort that you are feeling:  Avoid drinking alcohol.  Try acupuncture or massage to help relieve your symptoms.  Keep all follow-up visits as directed by your health care provider. This is important. SEEK MEDICAL CARE IF:  You continue to have episodes of paresthesia.  Your burning or prickling feeling gets worse when you walk.  You have pain, cramps, or dizziness.  You develop a rash. SEEK IMMEDIATE MEDICAL CARE IF:  You feel weak.  You have trouble walking or moving.  You have problems with speech, understanding, or vision.  You feel confused.  You cannot control your bladder or bowel movements.  You have numbness after an injury.  You faint.   This information is not intended to replace advice given to you by your health care  provider. Make sure you discuss any questions you have with your health care provider.   Document Released: 03/18/2002 Document Revised: 08/12/2014 Document Reviewed: 03/24/2014 Elsevier Interactive Patient Education [employer]2016 Elsevier Inc.

## 2015-09-17 NOTE — ED Notes (Signed)
Pt states she is feeling better at this time.  States her symptoms have improved.

## 2015-09-18 LAB — URINE CULTURE

## 2015-09-18 LAB — HEMOGLOBIN A1C
Hgb A1c MFr Bld: 5 % (ref 4.8–5.6)
Mean Plasma Glucose: 97 mg/dL

## 2015-09-18 LAB — RPR: RPR Ser Ql: NONREACTIVE

## 2015-09-18 LAB — RHEUMATOID FACTOR: Rhuematoid fact SerPl-aCnc: <10 IU/mL (ref 0.0–13.9)

## 2015-09-18 LAB — HOMOCYSTEINE: Homocysteine: 7.1 umol/L (ref 0.0–15.0)

## 2015-09-18 LAB — ANTINUCLEAR ANTIBODIES, IFA: ANTINUCLEAR ANTIBODIES, IFA: NEGATIVE

## 2015-10-21 DIAGNOSIS — G43909 Migraine, unspecified, not intractable, without status migrainosus: Secondary | ICD-10-CM | POA: Insufficient documentation

## 2015-12-10 LAB — HM HIV SCREENING LAB: HM HIV Screening: NEGATIVE

## 2016-02-25 DIAGNOSIS — Z9851 Tubal ligation status: Secondary | ICD-10-CM | POA: Insufficient documentation

## 2016-03-22 DIAGNOSIS — Z98891 History of uterine scar from previous surgery: Secondary | ICD-10-CM | POA: Insufficient documentation

## 2016-03-22 DIAGNOSIS — F3132 Bipolar disorder, current episode depressed, moderate: Secondary | ICD-10-CM | POA: Insufficient documentation

## 2017-12-26 ENCOUNTER — Other Ambulatory Visit: Payer: Self-pay

## 2017-12-26 ENCOUNTER — Encounter (HOSPITAL_COMMUNITY): Payer: Self-pay | Admitting: Emergency Medicine

## 2017-12-26 ENCOUNTER — Emergency Department (HOSPITAL_COMMUNITY)
Admission: EM | Admit: 2017-12-26 | Discharge: 2017-12-26 | Disposition: A | Discharge status: Home / Self Care | Payer: Self-pay | Attending: Emergency Medicine | Admitting: Emergency Medicine

## 2017-12-26 DIAGNOSIS — Z79899 Other long term (current) drug therapy: Secondary | ICD-10-CM | POA: Insufficient documentation

## 2017-12-26 DIAGNOSIS — J069 Acute upper respiratory infection, unspecified: Secondary | ICD-10-CM | POA: Insufficient documentation

## 2017-12-26 DIAGNOSIS — I10 Essential (primary) hypertension: Secondary | ICD-10-CM | POA: Insufficient documentation

## 2017-12-26 DIAGNOSIS — Z91040 Latex allergy status: Secondary | ICD-10-CM | POA: Insufficient documentation

## 2017-12-26 DIAGNOSIS — J029 Acute pharyngitis, unspecified: Secondary | ICD-10-CM | POA: Insufficient documentation

## 2017-12-26 DIAGNOSIS — Z7982 Long term (current) use of aspirin: Secondary | ICD-10-CM | POA: Insufficient documentation

## 2017-12-26 NOTE — ED Provider Notes (Signed)
[facility] EMERGENCY DEPARTMENT Provider Note   CSN: [id number] Arrival date & time: 12/26/17  1212     History   Chief Complaint Chief Complaint  Patient presents with  . Sore Throat    HPI [name] is a 32 y.o. female.  The history is provided by the patient.  Sore Throat  This is a new problem. The current episode started more than 1 week ago. The problem occurs daily. The problem has not changed since onset.Associated symptoms include headaches. Pertinent negatives include no chest pain, no abdominal pain and no shortness of breath. The symptoms are aggravated by swallowing. Nothing relieves the symptoms. She has tried acetaminophen for the symptoms.    Past Medical History:  Diagnosis Date  . Hypertension   . Knee pain     There are no active problems to display for this patient.   Past Surgical History:  Procedure Laterality Date  . APPENDECTOMY    . CESAREAN SECTION    . KNEE SURGERY       OB History    Gravida  1   Para      Term      Preterm      AB      Living        SAB      TAB      Ectopic      Multiple      Live Births               Home Medications    Prior to Admission medications   Medication Sig Start Date End Date Taking? Authorizing Provider  acetaminophen (TYLENOL) 500 MG tablet Take 1,500 mg by mouth every 6 (six) hours as needed.    [provider]  aspirin EC 81 MG tablet Take 81 mg by mouth daily.    [provider]  nitrofurantoin, macrocrystal-monohydrate, (MACROBID) 100 MG capsule Take 1 capsule (100 mg total) by mouth 2 (two) times daily. 09/17/15   Rancour, Jeannett SeniorStephen, MD  Prenatal Vit-Fe Fumarate-FA (PRENATAL PO) Take 1 tablet by mouth daily.    [provider]  promethazine (PHENERGAN) 25 MG tablet Take 25 mg by mouth every 8 (eight) hours as needed for nausea or vomiting.    [provider]    Family History History reviewed. No pertinent family history.  Social  History Social History   Tobacco Use  . Smoking status: Never Smoker  . Smokeless tobacco: Never Used  Substance Use Topics  . Alcohol use: No  . Drug use: Yes    Types: Marijuana    Comment: last used 02/20/13     Allergies   Keflex [cephalexin] and Latex   Review of Systems Review of Systems  Constitutional: Positive for fever. Negative for activity change and chills.       All ROS Neg except as noted in HPI  HENT: Positive for congestion and sore throat. Negative for nosebleeds.   Eyes: Negative for photophobia and discharge.  Respiratory: Negative for cough, shortness of breath and wheezing.   Cardiovascular: Negative for chest pain and palpitations.  Gastrointestinal: Negative for abdominal pain and blood in stool.  Genitourinary: Negative for dysuria, frequency and hematuria.  Musculoskeletal: Negative for arthralgias, back pain and neck pain.  Skin: Negative.  Negative for rash.  Neurological: Positive for headaches. Negative for dizziness, seizures and speech difficulty.  Psychiatric/Behavioral: Negative for confusion and hallucinations.     Physical Exam Updated Vital Signs BP 131/75 (BP  Location: Right Arm)   Pulse 83   Temp 98.3 F (36.8 C) (Oral)   Resp 16   Ht 5\' 6"  (1.676 m)   Wt 108.9 kg   SpO2 100%   BMI 38.74 kg/m   Physical Exam  Constitutional: She is oriented to person, place, and time. She appears well-developed and well-nourished.  Non-toxic appearance.  HENT:  Head: Normocephalic.  Right Ear: Tympanic membrane and external ear normal.  Left Ear: Tympanic membrane and external ear normal.  There is mild to moderate increased redness of the posterior pharynx.  The uvula is in the midline.  The airway is patent.  Eyes: Pupils are equal, round, and reactive to light. EOM and lids are normal.  Neck: Normal range of motion. Neck supple. Carotid bruit is not present.  Cardiovascular: Normal rate, regular rhythm, normal heart sounds, intact  distal pulses and normal pulses.  Pulmonary/Chest: Breath sounds normal. No respiratory distress.  Abdominal: Soft. Bowel sounds are normal. There is no tenderness. There is no guarding.  Musculoskeletal: Normal range of motion.  Lymphadenopathy:       Head (right side): No submandibular adenopathy present.       Head (left side): No submandibular adenopathy present.    She has no cervical adenopathy.  Neurological: She is alert and oriented to person, place, and time. She has normal strength. No cranial nerve deficit or sensory deficit.  Skin: Skin is warm and dry.  Psychiatric: She has a normal mood and affect. Her speech is normal.  Nursing note and vitals reviewed.    ED Treatments / Results  Labs (all labs ordered are listed, but only abnormal results are displayed) Labs Reviewed - No data to display  EKG None  Radiology No results found.  Procedures Procedures (including critical care time)  Medications Ordered in ED Medications - No data to display   Initial Impression / Assessment and Plan / ED Course  I have reviewed the triage vital signs and the nursing notes.  Pertinent labs & imaging results that were available during my care of the patient were reviewed by me and considered in my medical decision making (see chart for details).      Final Clinical Impressions(s) / ED Diagnoses MDM  Vital signs are within normal limits.  Pulse oximetry is 100% on room air.  The patient has had this problem for nearly a week.  Her daughter had initially and now she seems to be ill.  She is able to swallow liquids and some solids.  No unusual rash appreciated.  The patient has had some congestion present.  And some body aches.  The examination favors a viral upper respiratory infection with pharyngitis.  I have asked the patient to increase fluids.  Wash hands frequently.  To use salt water gargles and Chloraseptic spray.  Use Tylenol every 4 hours, or ibuprofen every 6 hours.   Patient is in agreement with this plan.   Final diagnoses:  Pharyngitis, unspecified etiology  Upper respiratory tract infection, unspecified type    ED Discharge Orders    None       Ivery Quale, PA-C 12/26/17 1351    Vanetta Mulders, MD 12/27/17 [phone]

## 2017-12-26 NOTE — ED Triage Notes (Signed)
Pt c/o of sore throat x 5 days.

## 2017-12-26 NOTE — Discharge Instructions (Addendum)
Your vital signs are within normal limits.  Your oxygen level is 100% on room air.  Your examination favors an upper respiratory infection with pharyngitis.  Please wash her hands frequently please increase fluids.  Please use ibuprofen every 6 hours, or Tylenol every 4 hours for fever, and/or aching, Chloraseptic spray will be helpful with the discomfort.  Please see your primary physician or return to the emergency department if any changes in your condition, problems, or concerns.

## 2019-01-31 ENCOUNTER — Other Ambulatory Visit: Payer: Self-pay

## 2019-01-31 ENCOUNTER — Encounter (HOSPITAL_COMMUNITY): Payer: Self-pay

## 2019-01-31 ENCOUNTER — Emergency Department (HOSPITAL_COMMUNITY)
Admission: EM | Admit: 2019-01-31 | Discharge: 2019-01-31 | Disposition: A | Discharge status: Home / Self Care | Payer: BC Managed Care – PPO | Attending: Emergency Medicine | Admitting: Emergency Medicine

## 2019-01-31 DIAGNOSIS — I10 Essential (primary) hypertension: Secondary | ICD-10-CM | POA: Diagnosis not present

## 2019-01-31 DIAGNOSIS — R519 Headache, unspecified: Secondary | ICD-10-CM | POA: Diagnosis present

## 2019-01-31 DIAGNOSIS — G43111 Migraine with aura, intractable, with status migrainosus: Secondary | ICD-10-CM | POA: Insufficient documentation

## 2019-01-31 DIAGNOSIS — Z91040 Latex allergy status: Secondary | ICD-10-CM | POA: Diagnosis not present

## 2019-01-31 DIAGNOSIS — Z79899 Other long term (current) drug therapy: Secondary | ICD-10-CM | POA: Diagnosis not present

## 2019-01-31 DIAGNOSIS — H6691 Otitis media, unspecified, right ear: Secondary | ICD-10-CM

## 2019-01-31 MED ORDER — IBUPROFEN 800 MG PO TABS
800.0000 mg | ORAL_TABLET | Freq: Once | ORAL | Status: DC
  Filled 2019-01-31: qty 1

## 2019-01-31 MED ORDER — AMOXICILLIN-POT CLAVULANATE 875-125 MG PO TABS: 1.0000 | ORAL_TABLET | Freq: Two times a day (BID) | ORAL | 0 refills | Status: AC

## 2019-01-31 NOTE — Discharge Instructions (Addendum)
We are treating you for an ear infection, which is likely also causing your prolonged headache.  He has sent Augmentin to your pharmacy.  Please take this daily for 10 days.  Please do not stop the antibiotic course early even if you are feeling better.    We very important for you to find a primary care provider for your high blood pressure and migraines.  For pain at home, you can take 800 mg of ibuprofen every 6-8 hours.  If needed, you can alternate this with extra strength Tylenol.  Please do not take ibuprofen for more than 7 to 10 days as this can cause kidney injury.  Please always take ibuprofen on a full stomach as it can also cause gastric ulcers.

## 2019-01-31 NOTE — ED Provider Notes (Signed)
[facility] EMERGENCY DEPARTMENT Provider Note   CSN: [id number] Arrival date & time: 01/31/19  1127  History    Chief Complaint  Patient presents with  . Headache    HPI [name] is a 33 y.o. female with PMHx s/f Complex migraines, who presents to the ED with headache.   does not have a problem list on file. The history is provided by the patient.  Headache Pain location:  Frontal, L temporal and R temporal Quality: throbbing. Radiates to:  Upper back, L neck and R neck Severity currently:  8/10 Severity at highest:  9/10 Onset quality:  Gradual Duration:  2 weeks Timing:  Constant Progression:  Worsening Chronicity:  Chronic Similar to prior headaches: yes   Context: activity   Relieved by:  Prescription medications and resting in a darkened room Worsened by:  Light and sound Ineffective treatments:  Acetaminophen, aspirin and NSAIDs Associated symptoms: dizziness, nausea, photophobia and visual change   Associated symptoms: no blurred vision, no congestion, no cough, no diarrhea, no drainage, no facial pain, no fever, no neck stiffness, no seizures, no sinus pressure, no sore throat and no vomiting   Risk factors: sedentary lifestyle    Aura with spots twice a day for 5-10 minutes. Last took imitrex two days ago. Took ASA or ibuprofen at 5:30 AM with mild relief.   Allergies: Keflex [cephalexin] and Latex Medications: No current facility-administered medications for this encounter.   Current Outpatient Medications:  .  acetaminophen (TYLENOL) 500 MG tablet, Take 1,500 mg by mouth every 6 (six) hours as needed., Disp: , Rfl:  .  amoxicillin-clavulanate (AUGMENTIN) 875-125 MG tablet, Take 1 tablet by mouth 2 (two) times daily for 10 days., Disp: 20 tablet, Rfl: 0 .  aspirin EC 81 MG tablet, Take 81 mg by mouth daily., Disp: , Rfl:  .  nitrofurantoin, macrocrystal-monohydrate, (MACROBID) 100 MG capsule, Take 1 capsule (100 mg total) by mouth 2 (two) times daily.,  Disp: 10 capsule, Rfl: 0 .  Prenatal Vit-Fe Fumarate-FA (PRENATAL PO), Take 1 tablet by mouth daily., Disp: , Rfl:  .  promethazine (PHENERGAN) 25 MG tablet, Take 25 mg by mouth every 8 (eight) hours as needed for nausea or vomiting., Disp: , Rfl:    Past Medical/Surgical History Past Medical History:  Diagnosis Date  . Hypertension   . Knee pain    There are no active problems to display for this patient.   Past Surgical History:  Procedure Laterality Date  . APPENDECTOMY    . CESAREAN SECTION    . KNEE SURGERY      OB History  Gravida Para Term Preterm AB Living  1 0 0 0 0 0  SAB TAB Ectopic Multiple Live Births  0 0 0 0 0    No family history on file. Social History   Tobacco Use  . Smoking status: Never Smoker  . Smokeless tobacco: Never Used  Substance Use Topics  . Alcohol use: No  . Drug use: Yes    Types: Marijuana    Comment: last used 02/20/13   Review of Systems Review of Systems  Constitutional: Negative for fever.  HENT: Negative for congestion, postnasal drip, sinus pressure and sore throat.   Eyes: Positive for photophobia. Negative for blurred vision.  Respiratory: Negative for cough.   Gastrointestinal: Positive for nausea. Negative for diarrhea and vomiting.  Musculoskeletal: Negative for neck stiffness.  Neurological: Positive for dizziness and headaches. Negative for seizures.    Physical Exam Updated  Vital Signs BP 133/90 (BP Location: Right Arm)   Pulse 71   Temp 98.3 F (36.8 C) (Oral)   Resp 14   Ht 5\' 6"  (1.676 m)   Wt 104.3 kg   LMP 01/29/2019   SpO2 99%   Breastfeeding Unknown   BMI 37.12 kg/m   Physical Exam Vitals signs and nursing note reviewed.  Constitutional:      General: She is not in acute distress.    Appearance: She is well-developed.  HENT:     Head: Normocephalic and atraumatic.     Right Ear: External ear normal. A middle ear effusion is present.     Left Ear: External ear normal. A middle ear effusion is  present.  Eyes:     Extraocular Movements: Extraocular movements intact.     Conjunctiva/sclera: Conjunctivae normal.     Pupils: Pupils are equal, round, and reactive to light.  Neck:     Musculoskeletal: Neck supple. No neck rigidity.  Cardiovascular:     Rate and Rhythm: Normal rate and regular rhythm.     Heart sounds: No murmur.  Pulmonary:     Effort: Pulmonary effort is normal. No respiratory distress.     Breath sounds: Normal breath sounds.  Abdominal:     Palpations: Abdomen is soft.     Tenderness: There is no abdominal tenderness.  Skin:    General: Skin is warm and dry.  Neurological:     Mental Status: She is alert.     ED Treatments / Results  Labs (all labs ordered are listed, but only abnormal results are displayed) Labs Reviewed - No data to display  EKG None  Radiology No results found.  Procedures Procedures (including critical care time)  Medications Ordered in ED Medications - No data to display  Initial Impression / Assessment and Plan / ED Course  I have reviewed the triage vital signs and the nursing notes. Pertinent labs & imaging results that were available during my care of the patient were reviewed by me and considered in my medical decision making (see chart for details). 33 year old patient with history of untreated hypertension and complex migraines presenting to the ED with headache.  Headache is bilateral in nature and has been present for the last week and a half.  Patient does not have a PCP but has been able to get Imitrex from urgent care providers in the past.  She reports that she ran out of her Imitrex 2 days ago.  She does report some relief with NSAIDs and aspirin.  On physical exam, she does not have any acute focal neurologic deficits or any cranial nerve abnormalities.  She does have middle ear effusion bilaterally and erythematous right ear canal.  Though she denies any congestion or sinus symptoms, she does have a history of  ear infections.  She reports taking amoxicillin in the past without any issue.  It is likely that a ear infection could be exacerbating her migraine headaches. Will send patient home with Augmentin for acute otitis media and encouraged her to find a primary care provider to manage her high blood pressure, migraines.    Final Clinical Impressions(s) / ED Diagnoses   Final diagnoses:  Acute bacterial middle ear infection, right  Intractable migraine with aura with status migrainosus   ED Discharge Orders         Ordered    amoxicillin-clavulanate (AUGMENTIN) 875-125 MG tablet  2 times daily     01/31/19 1357  Disposition:   Wilber Oliphant, M.D. FM PGY-2      Wilber Oliphant, MD 01/31/19 1450    Elnora Morrison, MD 01/31/19 [phone]

## 2019-01-31 NOTE — ED Triage Notes (Signed)
Pt has had a migraine headache for the last week and a half. Has been diagnosed with migraines. Also believes she has a sinus infection. Has tried Imitex without relief.

## 2019-02-06 ENCOUNTER — Other Ambulatory Visit: Payer: Self-pay

## 2019-02-06 DIAGNOSIS — Z20822 Contact with and (suspected) exposure to covid-19: Secondary | ICD-10-CM

## 2019-02-07 LAB — NOVEL CORONAVIRUS, NAA: SARS-CoV-2, NAA: NOT DETECTED

## 2019-06-05 ENCOUNTER — Other Ambulatory Visit: Payer: Self-pay

## 2019-06-05 ENCOUNTER — Encounter (HOSPITAL_COMMUNITY): Payer: Self-pay | Admitting: Emergency Medicine

## 2019-06-05 ENCOUNTER — Emergency Department (HOSPITAL_COMMUNITY)
Admission: EM | Admit: 2019-06-05 | Discharge: 2019-06-05 | Disposition: A | Discharge status: Home / Self Care | Payer: Self-pay | Attending: Emergency Medicine | Admitting: Emergency Medicine

## 2019-06-05 DIAGNOSIS — Z20822 Contact with and (suspected) exposure to covid-19: Secondary | ICD-10-CM | POA: Insufficient documentation

## 2019-06-05 DIAGNOSIS — Z7982 Long term (current) use of aspirin: Secondary | ICD-10-CM | POA: Insufficient documentation

## 2019-06-05 DIAGNOSIS — Z79899 Other long term (current) drug therapy: Secondary | ICD-10-CM | POA: Insufficient documentation

## 2019-06-05 DIAGNOSIS — J069 Acute upper respiratory infection, unspecified: Secondary | ICD-10-CM | POA: Insufficient documentation

## 2019-06-05 DIAGNOSIS — I10 Essential (primary) hypertension: Secondary | ICD-10-CM | POA: Insufficient documentation

## 2019-06-05 LAB — SARS CORONAVIRUS 2 (TAT 6-24 HRS): SARS Coronavirus 2: NEGATIVE

## 2019-06-05 NOTE — Discharge Instructions (Addendum)
Ms. [name] were seen in the emergency room for a sore throat and sinus pressure. This is likely viral in nature, so you were tested for COVID-19. Please follow-up the results later this evening or tomorrow in your MyChart account. If your symptoms worsen or fail to improve over the next week, please return.

## 2019-06-05 NOTE — ED Provider Notes (Signed)
[facility] EMERGENCY DEPARTMENT Provider Note   CSN: [id number] Arrival date & time: 06/05/19  5284     History Chief Complaint  Patient presents with  . Sore Throat    [name] is a 34 y.o. female with significant PMH of hypertension and migraines who presents to the emergency room for a sore throat. Pt states her sore throat began yesterday and worsened overnight. Endorses associated headaches, sinus pressure, and a tender lymph node on the L side. No fevers, cough, loss of taste/smell, shortness of breath, rhinorrhea, or diarrhea. No sick contacts or known COVID-19 exposures. Pt works at [employer] so states that she can't be certain on any workplace exposures. She is able to tolerate PO intake. Has been taking both ibuprofen (approx. 800mg  TID) and tylenol (approx. 1000mg  BID) daily due to her chronic migraines.   The history is provided by the patient.  Sore Throat This is a new problem. The current episode started yesterday. The problem occurs constantly. The problem has been gradually worsening. Associated symptoms include headaches. Pertinent negatives include no chest pain, no abdominal pain and no shortness of breath.      Past Medical History:  Diagnosis Date  . Hypertension   . Knee pain     There are no problems to display for this patient.   Past Surgical History:  Procedure Laterality Date  . APPENDECTOMY    . CESAREAN SECTION    . KNEE SURGERY       OB History    Gravida  3   Para      Term      Preterm      AB  1   Living  2     SAB  1   TAB      Ectopic      Multiple      Live Births              History reviewed. No pertinent family history.  Social History   Tobacco Use  . Smoking status: Never Smoker  . Smokeless tobacco: Never Used  Substance Use Topics  . Alcohol use: No  . Drug use: Yes    Types: Marijuana    Comment: last used 02/20/13    Home Medications Prior to Admission medications   Medication Sig Start  Date End Date Taking? Authorizing Provider  acetaminophen (TYLENOL) 500 MG tablet Take 1,500 mg by mouth every 6 (six) hours as needed.    [provider]  aspirin EC 81 MG tablet Take 81 mg by mouth daily.    [provider]  nitrofurantoin, macrocrystal-monohydrate, (MACROBID) 100 MG capsule Take 1 capsule (100 mg total) by mouth 2 (two) times daily. 09/17/15   Rancour, Annie Main, MD  Prenatal Vit-Fe Fumarate-FA (PRENATAL PO) Take 1 tablet by mouth daily.    [provider]  promethazine (PHENERGAN) 25 MG tablet Take 25 mg by mouth every 8 (eight) hours as needed for nausea or vomiting.    [provider]    Allergies    Keflex [cephalexin], Latex, and Skin bleaching [hydroquinone]  Review of Systems   Review of Systems  Constitutional: Negative for chills, fatigue and fever.  HENT: Positive for sinus pressure, sinus pain and sore throat. Negative for congestion, ear pain, postnasal drip, rhinorrhea and trouble swallowing.   Eyes: Negative for pain and discharge.  Respiratory: Negative for cough, shortness of breath and wheezing.   Cardiovascular: Negative for chest pain.  Gastrointestinal: Negative for abdominal  pain, diarrhea, nausea and vomiting.  Genitourinary: Negative for dysuria and frequency.  Musculoskeletal: Negative for arthralgias and myalgias.  Skin: Negative for rash.  Neurological: Positive for headaches. Negative for dizziness and light-headedness.   Physical Exam Updated Vital Signs BP (!) 129/93 (BP Location: Left Arm)   Pulse 72   Temp 98.1 F (36.7 C) (Oral)   Resp 18   Ht 5\' 6"  (1.676 m)   Wt 107 kg   LMP 05/29/2019   SpO2 99%   BMI 38.09 kg/m   Physical Exam Vitals and nursing note reviewed.  Constitutional:      General: She is not in acute distress.    Appearance: She is not ill-appearing or toxic-appearing.  HENT:     Head: Normocephalic and atraumatic.     Comments: Bilateral tenderness of maxillary and frontal  sinuses.    Right Ear: Tympanic membrane normal. No drainage or tenderness. No middle ear effusion. Tympanic membrane is not erythematous.     Left Ear: Tympanic membrane normal. No drainage or tenderness.  No middle ear effusion. Tympanic membrane is not erythematous.     Mouth/Throat:     Mouth: Mucous membranes are moist.     Pharynx: Oropharynx is clear. No pharyngeal swelling, oropharyngeal exudate or posterior oropharyngeal erythema.     Tonsils: No tonsillar exudate.  Eyes:     Conjunctiva/sclera: Conjunctivae normal.  Neck:     Comments: No lymphadenopathy. Cardiovascular:     Rate and Rhythm: Normal rate and regular rhythm.     Heart sounds: Normal heart sounds.  Pulmonary:     Effort: Pulmonary effort is normal. No respiratory distress.     Breath sounds: Normal breath sounds. No wheezing, rhonchi or rales.  Abdominal:     General: Bowel sounds are normal. There is no distension.     Palpations: Abdomen is soft.     Tenderness: There is no abdominal tenderness.  Musculoskeletal:     Cervical back: Normal range of motion.  Skin:    General: Skin is warm and dry.  Neurological:     General: No focal deficit present.     Mental Status: She is alert and oriented to person, place, and time.  Psychiatric:        Mood and Affect: Mood normal.    ED Results / Procedures / Treatments   Labs (all labs ordered are listed, but only abnormal results are displayed) Labs Reviewed  SARS CORONAVIRUS 2 (TAT 6-24 HRS)    EKG None  Radiology No results found.  Procedures Procedures (including critical care time)  Medications Ordered in ED Medications - No data to display  ED Course  I have reviewed the triage vital signs and the nursing notes.  Pertinent labs & imaging results that were available during my care of the patient were reviewed by me and considered in my medical decision making (see chart for details).    MDM Rules/Calculators/A&P                      Ms.  05/31/2019 is a 34 y.o. female with significant PMH of hypertension and migraines who presents for a 1 day history of sore throat, sinus pressure, and headaches. Low likelihood of strep pharyngitis, Centor Score 1 indicating no need for testing or antibiotics. HEENT exam benign without evidence of otitis media.  Most likely acute viral rhinosinusitis given sinus pressure symptoms and 1-2 day course. Will test for COVID given sore throat/headache symptoms and for  pt to be able to return to work.  Pt will follow-up with COVID-19 results later today or tomorrow via MyChart. Advised supportive care at home with increased fluid intake, ibuprofen as needed, and saline nasal spray. Instructed pt to return if symptoms worsening/do not improve over the next 7-10 days, she is unable to tolerate PO intake, or develops chest pain/shortness of breath.   Final Clinical Impression(s) / ED Diagnoses Final diagnoses:  Viral upper respiratory tract infection    Rx / DC Orders ED Discharge Orders    None       Thom Chimes, MD 06/05/19 3614    Cathren Laine, MD 06/05/19 [phone]

## 2019-06-05 NOTE — ED Triage Notes (Signed)
PT c/o sorethroat that started yesterday and tenderness to lymph nodes in neck with no fever x1 day with no fevers.

## 2019-08-06 ENCOUNTER — Emergency Department (HOSPITAL_COMMUNITY)
Admission: EM | Admit: 2019-08-06 | Discharge: 2019-08-06 | Disposition: A | Discharge status: Home / Self Care | Payer: Self-pay | Attending: Emergency Medicine | Admitting: Emergency Medicine

## 2019-08-06 ENCOUNTER — Encounter (HOSPITAL_COMMUNITY): Payer: Self-pay | Admitting: Emergency Medicine

## 2019-08-06 ENCOUNTER — Other Ambulatory Visit: Payer: Self-pay

## 2019-08-06 DIAGNOSIS — R300 Dysuria: Secondary | ICD-10-CM | POA: Insufficient documentation

## 2019-08-06 DIAGNOSIS — A084 Viral intestinal infection, unspecified: Secondary | ICD-10-CM | POA: Insufficient documentation

## 2019-08-06 DIAGNOSIS — I10 Essential (primary) hypertension: Secondary | ICD-10-CM | POA: Insufficient documentation

## 2019-08-06 LAB — LIPASE, BLOOD: Lipase: 25 U/L (ref 11–51)

## 2019-08-06 LAB — URINALYSIS, ROUTINE W REFLEX MICROSCOPIC
Bacteria, UA: NONE SEEN
Bilirubin Urine: NEGATIVE
Glucose, UA: NEGATIVE mg/dL
Ketones, ur: NEGATIVE mg/dL
Nitrite: NEGATIVE
Protein, ur: NEGATIVE mg/dL
RBC / HPF: >50 RBC/hpf — ABNORMAL HIGH (ref 0–5)
Specific Gravity, Urine: 1.019 (ref 1.005–1.030)
pH: 6 (ref 5.0–8.0)

## 2019-08-06 LAB — COMPREHENSIVE METABOLIC PANEL
ALT: 28 U/L (ref 0–44)
AST: 19 U/L (ref 15–41)
Albumin: 4.2 g/dL (ref 3.5–5.0)
Alkaline Phosphatase: 70 U/L (ref 38–126)
Anion gap: 10 (ref 5–15)
BUN: 8 mg/dL (ref 6–20)
CO2: 25 mmol/L (ref 22–32)
Calcium: 9.3 mg/dL (ref 8.9–10.3)
Chloride: 104 mmol/L (ref 98–111)
Creatinine, Ser: 0.74 mg/dL (ref 0.44–1.00)
GFR calc Af Amer: >60 mL/min (ref >60)
GFR calc non Af Amer: >60 mL/min (ref >60)
Glucose, Bld: 94 mg/dL (ref 70–99)
Potassium: 4.1 mmol/L (ref 3.5–5.1)
Sodium: 139 mmol/L (ref 135–145)
Total Bilirubin: 0.8 mg/dL (ref 0.3–1.2)
Total Protein: 7 g/dL (ref 6.5–8.1)

## 2019-08-06 LAB — PREGNANCY, URINE: Preg Test, Ur: NEGATIVE

## 2019-08-06 LAB — CBC
HCT: 40.3 % (ref 36.0–46.0)
Hemoglobin: 13.1 g/dL (ref 12.0–15.0)
MCH: 31.3 pg (ref 26.0–34.0)
MCHC: 32.5 g/dL (ref 30.0–36.0)
MCV: 96.4 fL (ref 80.0–100.0)
Platelets: 296 10*3/uL (ref 150–400)
RBC: 4.18 MIL/uL (ref 3.87–5.11)
RDW: 12 % (ref 11.5–15.5)
WBC: 4.1 10*3/uL (ref 4.0–10.5)
nRBC: 0 % (ref 0.0–0.2)

## 2019-08-06 LAB — WET PREP, GENITAL
Clue Cells Wet Prep HPF POC: NONE SEEN
Sperm: NONE SEEN
Trich, Wet Prep: NONE SEEN
Yeast Wet Prep HPF POC: NONE SEEN

## 2019-08-06 MED ORDER — PROMETHAZINE HCL 25 MG PO TABS
25.0000 mg | ORAL_TABLET | Freq: Four times a day (QID) | ORAL | 0 refills | Status: DC | PRN
Start: 2019-08-06 — End: 2019-08-26

## 2019-08-06 MED ORDER — SODIUM CHLORIDE 0.9 % IV BOLUS
1000.0000 mL | Freq: Once | INTRAVENOUS | Status: AC
  Administered 2019-08-06: 1000 mL via INTRAVENOUS

## 2019-08-06 MED ORDER — SODIUM CHLORIDE 0.9% FLUSH: 3.0000 mL | Freq: Once | INTRAVENOUS | Status: DC

## 2019-08-06 MED ORDER — ONDANSETRON HCL 4 MG/2ML IJ SOLN
4.0000 mg | Freq: Once | INTRAMUSCULAR | Status: AC
  Administered 2019-08-06: 11:00:00 4 mg via INTRAVENOUS
  Filled 2019-08-06: qty 2

## 2019-08-06 MED ORDER — SULFAMETHOXAZOLE-TRIMETHOPRIM 800-160 MG PO TABS: 1.0000 | ORAL_TABLET | Freq: Two times a day (BID) | ORAL | 0 refills | Status: AC

## 2019-08-06 NOTE — Progress Notes (Signed)
CSW at bedside to inquire if patient has a primary care provider that she would like added to the chart. Patient explains that she is without primary care. CSW educated patient on the [facility] Connect program for [facility] residents. Patient is receptive and would like assistance with setting up an appointment. CSW provided patient with [facility] brochure and offered to contact rep to schedule appointment. Patient states that she will do it and that she just "doesn't feel up to it right now."   No further needs at this time.  [name] Sherryle Lis LCSWA Transitions of Care  Clinical Social Worker  Ph: [phone]

## 2019-08-06 NOTE — ED Notes (Signed)
Pt unable to finish drinking fluids. Pt became nauseous.

## 2019-08-06 NOTE — ED Triage Notes (Signed)
Pt with c/o vomiting and diarrhea x 2 days.

## 2019-08-06 NOTE — ED Provider Notes (Signed)
[facility] EMERGENCY DEPARTMENT Provider Note   CSN: [id number] Arrival date & time: 08/06/19  1017     History Chief Complaint  Patient presents with  . Emesis    [name] is a 34 y.o. female with a history of hypertension presenting with a now 24-hour history of nausea vomiting and diarrhea.  She describes greater than 20 episodes of vomiting since her symptoms began upon waking yesterday, 4 episodes of brown watery diarrhea associated with waxing and waning generalized abdominal cramping which improves after emesis or diarrhea.  She denies fevers but has felt chilled.  She also has noted subtle increased burning with urination although denies urinary frequency.  She has had no hematemesis, bloody stools or hematuria.  To her knowledge she has had no exposures to anybody with similar symptoms, although works at a [employer] and interacts with the public.  She has had no treatments prior to arrival.  The history is provided by the patient.       Past Medical History:  Diagnosis Date  . Hypertension   . Knee pain     There are no problems to display for this patient.   Past Surgical History:  Procedure Laterality Date  . APPENDECTOMY    . CESAREAN SECTION    . KNEE SURGERY       OB History    Gravida  3   Para      Term      Preterm      AB  1   Living  2     SAB  1   TAB      Ectopic      Multiple      Live Births              History reviewed. No pertinent family history.  Social History   Tobacco Use  . Smoking status: Never Smoker  . Smokeless tobacco: Never Used  Substance Use Topics  . Alcohol use: No  . Drug use: Yes    Types: Marijuana    Comment: last used 02/20/13    Home Medications Prior to Admission medications   Medication Sig Start Date End Date Taking? Authorizing Provider  albuterol (VENTOLIN HFA) 108 (90 Base) MCG/ACT inhaler Inhale 2 puffs into the lungs every 4 (four) hours as needed. 04/09/19  Yes [provider]  promethazine (PHENERGAN) 25 MG tablet Take 1 tablet (25 mg total) by mouth every 6 (six) hours as needed for nausea or vomiting. 08/06/19   Kelse Ploch, Raynelle Fanning, PA-C  sulfamethoxazole-trimethoprim (BACTRIM DS) 800-160 MG tablet Take 1 tablet by mouth 2 (two) times daily for 5 days. 08/06/19 08/11/19  Burgess Amor, PA-C    Allergies    Sodium hypochlorite, Keflex [cephalexin], Latex, and Skin bleaching [hydroquinone]  Review of Systems   Review of Systems  Constitutional: Positive for chills. Negative for fever.  HENT: Negative for congestion and sore throat.   Eyes: Negative.   Respiratory: Negative for chest tightness and shortness of breath.   Cardiovascular: Negative for chest pain.  Gastrointestinal: Positive for abdominal pain, diarrhea, nausea and vomiting. Negative for blood in stool.  Genitourinary: Positive for dysuria. Negative for frequency, hematuria and urgency.  Musculoskeletal: Negative for arthralgias, joint swelling and neck pain.  Skin: Negative.  Negative for rash and wound.  Neurological: Negative for dizziness, weakness, light-headedness, numbness and headaches.  Psychiatric/Behavioral: Negative.     Physical Exam Updated Vital Signs BP (!) 132/91   Pulse 82  Temp 98.4 F (36.9 C) (Oral)   Resp 16   Ht 5\' 6"  (1.676 m)   Wt 104.3 kg   LMP 07/31/2019   SpO2 100%   BMI 37.12 kg/m   Physical Exam Vitals and nursing note reviewed.  Constitutional:      Appearance: She is well-developed.  HENT:     Head: Normocephalic and atraumatic.  Eyes:     Conjunctiva/sclera: Conjunctivae normal.  Cardiovascular:     Rate and Rhythm: Normal rate and regular rhythm.     Heart sounds: Normal heart sounds.  Pulmonary:     Effort: Pulmonary effort is normal.     Breath sounds: Normal breath sounds. No wheezing.  Abdominal:     General: Bowel sounds are normal. There is no distension.     Palpations: Abdomen is soft.     Tenderness: There is no abdominal  tenderness. There is no right CVA tenderness, left CVA tenderness or guarding.     Comments: Benign abdominal exam  Genitourinary:    Vagina: No signs of injury. Tenderness present.     Cervix: No cervical motion tenderness, friability or erythema.     Uterus: Normal. Not enlarged and not tender.      Adnexa: Right adnexa normal and left adnexa normal.     Comments: Light yellow watery discharge.  There is a tender small abraded area at the posterior fornix. Musculoskeletal:        General: Normal range of motion.     Cervical back: Normal range of motion.  Skin:    General: Skin is warm and dry.  Neurological:     Mental Status: She is alert.     ED Results / Procedures / Treatments   Labs (all labs ordered are listed, but only abnormal results are displayed) Labs Reviewed  WET PREP, GENITAL - Abnormal; Notable for the following components:      Result Value   WBC, Wet Prep HPF POC MANY (*)    All other components within normal limits  URINALYSIS, ROUTINE W REFLEX MICROSCOPIC - Abnormal; Notable for the following components:   APPearance HAZY (*)    Hgb urine dipstick LARGE (*)    Leukocytes,Ua TRACE (*)    RBC / HPF >50 (*)    All other components within normal limits  URINE CULTURE  LIPASE, BLOOD  COMPREHENSIVE METABOLIC PANEL  CBC  PREGNANCY, URINE  GC/CHLAMYDIA PROBE AMP () NOT AT [facility]    EKG None  Radiology No results found.  Procedures Procedures (including critical care time)  Medications Ordered in ED Medications  sodium chloride flush (NS) 0.9 % injection 3 mL (3 mLs Intravenous Not Given 08/06/19 0949)  sodium chloride 0.9 % bolus 1,000 mL (0 mLs Intravenous Stopped 08/06/19 1217)  ondansetron (ZOFRAN) injection 4 mg (4 mg Intravenous Given 08/06/19 1057)    ED Course  I have reviewed the triage vital signs and the nursing notes.  Pertinent labs & imaging results that were available during my care of the patient were reviewed by me and  considered in my medical decision making (see chart for details).    MDM Rules/Calculators/A&P                      Patient with a 24-hour history of nausea vomiting and diarrhea suggesting a viral gastroenteritis.  She was given IV fluids along with Zofran and was able to tolerate p.o. intake although she still had some mild nausea.  She  had no diarrhea while here.  Her electrolytes are stable as are her vital signs.  Prior to discharge while reviewing her labs it was noted that she had significant hemoglobin in her urine.  She denies flank pain or abdominal pain other than cramping associated with vomiting and diarrhea, no history of kidney stones.  She did note at this time that she had had rougher than normal sex 5 days ago after a significant period of abstinence.  Since that night she has had dysuria along with darker than normal urine and has been concerned about possible injury.  She denies vaginal spotting or discharge.  Pelvic exam was performed with no bleeding or injury noted.  Patient was placed on Bactrim for presumptive UTI pending urine cultures.  She was also prescribed Phenergan for symptom relief.  Discussed return precautions.  Also discussed other home treatments including increase fluid intake.  She is aware that her GC and Chlamydia cultures are pending.  She has no CMT or other findings to suggest cervicitis or PID and she doubts STD exposure. Final Clinical Impression(s) / ED Diagnoses Final diagnoses:  Dysuria  Viral gastroenteritis    Rx / DC Orders ED Discharge Orders         Ordered    promethazine (PHENERGAN) 25 MG tablet  Every 6 hours PRN     08/06/19 1500    sulfamethoxazole-trimethoprim (BACTRIM DS) 800-160 MG tablet  2 times daily     08/06/19 1500           Burgess Amor, PA-C 08/06/19 1659    Blane Ohara, MD 08/09/19 1515

## 2019-08-06 NOTE — Discharge Instructions (Signed)
Take the Phenergan if needed for any persistent nausea or vomiting.  I recommend allowing the diarrhea to run its course as it has been minimal symptom for you.  Make sure you are drinking plenty of fluids.  You are being treated for urinary tract infection given your symptoms and the increased red blood cells in your urine.  You have also been screened for STDs including gonorrhea and chlamydia, although your exam does not suggest this infection.  If either of these tests are positive however you will be contacted and additional medication will be prescribed.  Get rechecked for any persistent or worsening symptoms.  Foods that tend to sit well on the stomach including bananas, rice, applesauce, toast or other bland foods that sound appealing.

## 2019-08-06 NOTE — Progress Notes (Signed)
CSW has reviewed patients chart and notes that patient is without primary care and/or insurance. CSW will follow up with patient regarding this matter and provide assistance as needed.   [name] M. Eliz Nigg LCSWA Transitions of Care  Clinical Social Worker  Ph: [phone] 

## 2019-08-07 LAB — GC/CHLAMYDIA PROBE AMP (~~LOC~~) NOT AT ARMC
Chlamydia: NEGATIVE
Comment: NEGATIVE
Comment: NORMAL
Neisseria Gonorrhea: NEGATIVE

## 2019-08-07 LAB — URINE CULTURE: Culture: <10000 — AB

## 2019-08-26 ENCOUNTER — Ambulatory Visit
Admission: EM | Admit: 2019-08-26 | Discharge: 2019-08-26 | Disposition: A | Discharge status: Home / Self Care | Payer: Self-pay | Attending: Emergency Medicine | Admitting: Emergency Medicine

## 2019-08-26 ENCOUNTER — Other Ambulatory Visit: Payer: Self-pay

## 2019-08-26 DIAGNOSIS — Z1152 Encounter for screening for COVID-19: Secondary | ICD-10-CM

## 2019-08-26 DIAGNOSIS — J029 Acute pharyngitis, unspecified: Secondary | ICD-10-CM

## 2019-08-26 LAB — POCT RAPID STREP A (OFFICE): Rapid Strep A Screen: NEGATIVE

## 2019-08-26 MED ORDER — FLUTICASONE PROPIONATE 50 MCG/ACT NA SUSP
1.0000 | Freq: Every day | NASAL | 0 refills | Status: DC
Start: 2019-08-26 — End: 2019-10-23

## 2019-08-26 MED ORDER — CETIRIZINE HCL 10 MG PO TABS: 10.0000 mg | ORAL_TABLET | Freq: Every day | ORAL | 0 refills | Status: DC

## 2019-08-26 MED ORDER — BENZONATATE 100 MG PO CAPS
100.0000 mg | ORAL_CAPSULE | Freq: Three times a day (TID) | ORAL | 0 refills | Status: DC
Start: 2019-08-26 — End: 2019-10-23

## 2019-08-26 MED ORDER — MENTHOL 3 MG MT LOZG: 1.0000 | LOZENGE | OROMUCOSAL | 0 refills | Status: DC | PRN

## 2019-08-26 NOTE — ED Triage Notes (Signed)
Pt c/o fever, sore throat, dizziness and cough since waking this AM. Fatigued yesterday. Pt has taken ibuprofen and tylenol at home.

## 2019-08-26 NOTE — ED Provider Notes (Signed)
[facility]    CSN: [id number] Arrival date & time: 08/26/19  1340      History   Chief Complaint Chief Complaint  Patient presents with  . Fever  . Sore Throat    HPI [name] is a 34 y.o. female.   Who presented to the urgent care with a complaint of fever, fatigue, sore throat, cough and dizziness that started this morning.  Denies sick exposure to COVID, flu or strep.  Denies recent travel.  Denies aggravating or alleviating symptoms.  Denies previous COVID infection.   Denies fever, chills,  nasal congestion, rhinorrhea, SOB, wheezing, chest pain, nausea, vomiting, changes in bowel or bladder habits.    The history is provided by the patient.  Fever Associated symptoms: cough and sore throat   Sore Throat    Past Medical History:  Diagnosis Date  . Hypertension   . Knee pain     There are no problems to display for this patient.   Past Surgical History:  Procedure Laterality Date  . APPENDECTOMY    . CESAREAN SECTION    . KNEE SURGERY      OB History    Gravida  3   Para      Term      Preterm      AB  1   Living  2     SAB  1   TAB      Ectopic      Multiple      Live Births               Home Medications    Prior to Admission medications   Medication Sig Start Date End Date Taking? Authorizing Provider  albuterol (VENTOLIN HFA) 108 (90 Base) MCG/ACT inhaler Inhale 2 puffs into the lungs every 4 (four) hours as needed. 04/09/19   [provider]  benzonatate (TESSALON) 100 MG capsule Take 1 capsule (100 mg total) by mouth every 8 (eight) hours. 08/26/19   Massie Cogliano, Zachery Dakins, FNP  cetirizine (ZYRTEC ALLERGY) 10 MG tablet Take 1 tablet (10 mg total) by mouth daily. 08/26/19   Lilit Cinelli, Zachery Dakins, FNP  fluticasone (FLONASE) 50 MCG/ACT nasal spray Place 1 spray into both nostrils daily for 14 days. 08/26/19 09/09/19  Kalem Rockwell, Zachery Dakins, FNP  menthol-cetylpyridinium (CEPACOL) 3 MG lozenge Take 1 lozenge (3 mg  total) by mouth as needed for sore throat. 08/26/19   Tayshawn Purnell, Zachery Dakins, FNP    Family History No family history on file.  Social History Social History   Tobacco Use  . Smoking status: Never Smoker  . Smokeless tobacco: Never Used  Substance Use Topics  . Alcohol use: No  . Drug use: Yes    Types: Marijuana    Comment: last used 02/20/13     Allergies   Sodium hypochlorite, Keflex [cephalexin], Latex, and Skin bleaching [hydroquinone]   Review of Systems Review of Systems  Constitutional: Positive for fatigue and fever.  HENT: Positive for sore throat.   Respiratory: Positive for cough.   Cardiovascular: Negative.   Gastrointestinal: Negative.   Neurological: Negative.   All other systems reviewed and are negative.    Physical Exam Triage Vital Signs ED Triage Vitals  Enc Vitals Group     BP 08/26/19 1349 128/90     Pulse Rate 08/26/19 1349 81     Resp 08/26/19 1349 15     Temp 08/26/19 1349 98.9 F (37.2 C)  Temp Source 08/26/19 1349 Oral     SpO2 08/26/19 1349 99 %     Weight --      Height --      Head Circumference --      Peak Flow --      Pain Score 08/26/19 1359 4     Pain Loc --      Pain Edu? --      Excl. in [place]? --    No data found.  Updated Vital Signs BP 128/90 (BP Location: Right Arm)   Pulse 81   Temp 98.9 F (37.2 C) (Oral)   Resp 15   LMP 07/31/2019   SpO2 99%   Visual Acuity Right Eye Distance:   Left Eye Distance:   Bilateral Distance:    Right Eye Near:   Left Eye Near:    Bilateral Near:     Physical Exam Vitals and nursing note reviewed.  Constitutional:      General: She is not in acute distress.    Appearance: Normal appearance. She is normal weight. She is not ill-appearing or toxic-appearing.  HENT:     Head: Normocephalic.     Right Ear: Tympanic membrane, ear canal and external ear normal. There is no impacted cerumen.     Left Ear: Tympanic membrane, ear canal and external ear normal. There is no  impacted cerumen.     Nose: Nose normal. No congestion.     Mouth/Throat:     Mouth: Mucous membranes are moist.     Pharynx: Oropharynx is clear. No oropharyngeal exudate or posterior oropharyngeal erythema.     Tonsils: 1+ on the right. 1+ on the left.  Cardiovascular:     Rate and Rhythm: Normal rate and regular rhythm.     Pulses: Normal pulses.     Heart sounds: Normal heart sounds. No murmur.  Pulmonary:     Effort: Pulmonary effort is normal. No respiratory distress.     Breath sounds: Normal breath sounds. No wheezing or rhonchi.  Chest:     Chest wall: No tenderness.  Abdominal:     Palpations: There is no mass.  Neurological:     Mental Status: She is alert and oriented to person, place, and time.      UC Treatments / Results  Labs (all labs ordered are listed, but only abnormal results are displayed) Labs Reviewed  NOVEL CORONAVIRUS, NAA  CULTURE, GROUP A STREP [facility])  POCT RAPID STREP A (OFFICE)    EKG   Radiology No results found.  Procedures Procedures (including critical care time)  Medications Ordered in UC Medications - No data to display  Initial Impression / Assessment and Plan / UC Course  I have reviewed the triage vital signs and the nursing notes.  Pertinent labs & imaging results that were available during my care of the patient were reviewed by me and considered in my medical decision making (see chart for details).    Patient stable at discharge.  Tessalon, Zyrtec, Flonase and Cepacol lozenges were prescribed.  She was advised to quarantine until COVID-19 test result become available.  Work note was given.  Final Clinical Impressions(s) / UC Diagnoses   Final diagnoses:  Sore throat  Encounter for screening for COVID-19     Discharge Instructions     POC strep test was negative.  COVID testing ordered.  It will take between 2-7 days for test results.  Someone will contact you regarding abnormal results.    In  the  meantime: You should remain isolated in your home for 10 days from symptom onset AND greater than 24 hours after symptoms resolution (absence of fever without the use of fever-reducing medication and improvement in respiratory symptoms), whichever is longer Get plenty of rest and push fluids Tessalon Perles prescribed for cough Zyrtec-D prescribed for nasal congestion, runny nose, and/or sore throat Flonase prescribed for nasal congestion and runny nose Use medications daily for symptom relief Use OTC medications like ibuprofen or tylenol as needed fever or pain Call or go to the ED if you have any new or worsening symptoms such as fever, worsening cough, shortness of breath, chest tightness, chest pain, turning blue, changes in mental status, etc...     ED Prescriptions    Medication Sig Dispense Auth. Provider   benzonatate (TESSALON) 100 MG capsule Take 1 capsule (100 mg total) by mouth every 8 (eight) hours. 21 capsule Megin Consalvo, Zachery Dakins, FNP   cetirizine (ZYRTEC ALLERGY) 10 MG tablet Take 1 tablet (10 mg total) by mouth daily. 30 tablet Kathyjo Briere S, FNP   fluticasone (FLONASE) 50 MCG/ACT nasal spray Place 1 spray into both nostrils daily for 14 days. 16 g Tayllor Breitenstein, Zachery Dakins, FNP   menthol-cetylpyridinium (CEPACOL) 3 MG lozenge Take 1 lozenge (3 mg total) by mouth as needed for sore throat. 100 tablet Keiara Sneeringer, Zachery Dakins, FNP     PDMP not reviewed this encounter.   Durward Parcel, FNP 08/26/19 1435

## 2019-08-26 NOTE — Discharge Instructions (Addendum)
POC strep test was negative.  COVID testing ordered.  It will take between 2-7 days for test results.  Someone will contact you regarding abnormal results.    In the meantime: You should remain isolated in your home for 10 days from symptom onset AND greater than 24 hours after symptoms resolution (absence of fever without the use of fever-reducing medication and improvement in respiratory symptoms), whichever is longer Get plenty of rest and push fluids Tessalon Perles prescribed for cough Zyrtec-D prescribed for nasal congestion, runny nose, and/or sore throat Flonase prescribed for nasal congestion and runny nose Use medications daily for symptom relief Use OTC medications like ibuprofen or tylenol as needed fever or pain Call or go to the ED if you have any new or worsening symptoms such as fever, worsening cough, shortness of breath, chest tightness, chest pain, turning blue, changes in mental status, etc..Marland Kitchen

## 2019-08-27 LAB — NOVEL CORONAVIRUS, NAA: SARS-CoV-2, NAA: NOT DETECTED

## 2019-08-27 LAB — SARS-COV-2, NAA 2 DAY TAT

## 2019-08-28 LAB — CULTURE, GROUP A STREP (THRC)

## 2019-10-23 ENCOUNTER — Other Ambulatory Visit: Payer: Self-pay

## 2019-10-23 ENCOUNTER — Ambulatory Visit
Admission: EM | Admit: 2019-10-23 | Discharge: 2019-10-23 | Disposition: A | Discharge status: Home / Self Care | Payer: Self-pay | Attending: Emergency Medicine | Admitting: Emergency Medicine

## 2019-10-23 DIAGNOSIS — J069 Acute upper respiratory infection, unspecified: Secondary | ICD-10-CM | POA: Insufficient documentation

## 2019-10-23 DIAGNOSIS — Z20822 Contact with and (suspected) exposure to covid-19: Secondary | ICD-10-CM | POA: Insufficient documentation

## 2019-10-23 DIAGNOSIS — R82998 Other abnormal findings in urine: Secondary | ICD-10-CM | POA: Insufficient documentation

## 2019-10-23 DIAGNOSIS — Z0189 Encounter for other specified special examinations: Secondary | ICD-10-CM | POA: Insufficient documentation

## 2019-10-23 LAB — POCT URINALYSIS DIP (MANUAL ENTRY)
Bilirubin, UA: NEGATIVE
Blood, UA: NEGATIVE
Glucose, UA: NEGATIVE mg/dL
Ketones, POC UA: NEGATIVE mg/dL
Nitrite, UA: NEGATIVE
Protein Ur, POC: NEGATIVE mg/dL
Spec Grav, UA: 1.025 (ref 1.010–1.025)
Urobilinogen, UA: 0.2 E.U./dL
pH, UA: 7 (ref 5.0–8.0)

## 2019-10-23 LAB — POCT URINE PREGNANCY: Preg Test, Ur: NEGATIVE

## 2019-10-23 MED ORDER — BENZONATATE 100 MG PO CAPS
100.0000 mg | ORAL_CAPSULE | Freq: Three times a day (TID) | ORAL | 0 refills | Status: DC
Start: 2019-10-23 — End: 2020-01-24

## 2019-10-23 MED ORDER — PREDNISONE 20 MG PO TABS: 20.0000 mg | ORAL_TABLET | Freq: Two times a day (BID) | ORAL | 0 refills | Status: AC

## 2019-10-23 NOTE — Discharge Instructions (Signed)
COVID testing ordered.  It will take between 2-5 days for test results.  Someone will contact you regarding abnormal results.    In the meantime: You should remain isolated in your home for 10 days from symptom onset AND greater than 72 hours after symptoms resolution (absence of fever without the use of fever-reducing medication and improvement in respiratory symptoms), whichever is longer Get plenty of rest and push fluids Prednisone prescribed.  Take as directed and to completion Tessalon Perles prescribed for cough  Use OTC zyrtec for nasal congestion, runny nose, and/or sore throat Use OTC flonase for nasal congestion and runny nose Use medications daily for symptom relief Use OTC medications like ibuprofen or tylenol as needed fever or pain Call or go to the ED if you have any new or worsening symptoms such as fever, worsening cough, shortness of breath, chest tightness, chest pain, turning blue, changes in mental status, etc...   Urine without obvious signs of infection at this time Urine culture sent.  We will call you with abnormal results.

## 2019-10-23 NOTE — ED Triage Notes (Signed)
Pt presents with complaints of cough, nasal congestion, and migraine headache x 4 days. Reports she feels like she is getting bronchitis. Pt reports having left over antibiotics at home that she tried to take. Denies feeling better after taking that. Pt reports pain in her back that feels like her lungs. States this pain feels better after urinating.

## 2019-10-23 NOTE — ED Provider Notes (Signed)
[facility]   [id number] 10/23/19 Arrival Time: 1059   CC: COVID symptoms  SUBJECTIVE: History from: patient.  [name] is a 34 y.o. female who presents with productive cough with yellow sputum, nasal congestion, sinus congestion/ pain/ pressure, and headache x 4 days.  Denies sick exposure to COVID, flu or strep.  Has tried left over antibiotic without relief.  Denies aggravating factors.  Reports previous symptoms in the past with bronchitis.   Also complains of dark urine x 1-2 days.  Denies fever, chills, SOB, wheezing, chest pain, nausea, changes in bowel habits.    ROS: As per HPI.  All other pertinent ROS negative.     Past Medical History:  Diagnosis Date  . Hypertension   . Knee pain    Past Surgical History:  Procedure Laterality Date  . APPENDECTOMY    . CESAREAN SECTION    . KNEE SURGERY     Allergies  Allergen Reactions  . Sodium Hypochlorite Shortness Of Breath  . Keflex [Cephalexin]   . Latex   . Skin Bleaching [Hydroquinone]     Facial swelling, SOB   No current facility-administered medications on file prior to encounter.   Current Outpatient Medications on File Prior to Encounter  Medication Sig Dispense Refill  . [DISCONTINUED] albuterol (VENTOLIN HFA) 108 (90 Base) MCG/ACT inhaler Inhale 2 puffs into the lungs every 4 (four) hours as needed.    . [DISCONTINUED] cetirizine (ZYRTEC ALLERGY) 10 MG tablet Take 1 tablet (10 mg total) by mouth daily. 30 tablet 0  . [DISCONTINUED] fluticasone (FLONASE) 50 MCG/ACT nasal spray Place 1 spray into both nostrils daily for 14 days. 16 g 0   Social History   Socioeconomic History  . Marital status: Single    Spouse name: Not on file  . Number of children: Not on file  . Years of education: Not on file  . Highest education level: Not on file  Occupational History  . Not on file  Tobacco Use  . Smoking status: Never Smoker  . Smokeless tobacco: Never Used  Vaping Use  . Vaping Use: Never used   Substance and Sexual Activity  . Alcohol use: No  . Drug use: Yes    Types: Marijuana    Comment: last used 02/20/13  . Sexual activity: Yes    Birth control/protection: None  Other Topics Concern  . Not on file  Social History Narrative  . Not on file   Social Determinants of Health   Financial Resource Strain:   . Difficulty of Paying Living Expenses:   Food Insecurity:   . Worried About Programme researcher, broadcasting/film/video in the Last Year:   . Barista in the Last Year:   Transportation Needs:   . Freight forwarder (Medical):   Marland Kitchen Lack of Transportation (Non-Medical):   Physical Activity:   . Days of Exercise per Week:   . Minutes of Exercise per Session:   Stress:   . Feeling of Stress :   Social Connections:   . Frequency of Communication with Friends and Family:   . Frequency of Social Gatherings with Friends and Family:   . Attends Religious Services:   . Active Member of Clubs or Organizations:   . Attends Banker Meetings:   Marland Kitchen Marital Status:   Intimate Partner Violence:   . Fear of Current or Ex-Partner:   . Emotionally Abused:   Marland Kitchen Physically Abused:   . Sexually Abused:  Family History  Problem Relation Age of Onset  . Hypertension Mother   . COPD Mother   . Healthy Father     OBJECTIVE:  Vitals:   10/23/19 1129  BP: 134/90  Pulse: 80  Resp: 16  Temp: 97.9 F (36.6 C)  TempSrc: Tympanic  SpO2: 98%     General appearance: alert; appears fatigued, but nontoxic; speaking in full sentences and tolerating own secretions HEENT: NCAT; Ears: EACs clear, TMs pearly gray; Eyes: PERRL.  EOM grossly intact. Sinuses: nontender; Nose: nares patent without rhinorrhea, Throat: oropharynx clear, tonsils non erythematous or enlarged, uvula midline  Neck: supple without LAD Lungs: unlabored respirations, symmetrical air entry; cough: mild; no respiratory distress; CTAB Heart: regular rate and rhythm.   Skin: warm and dry Psychological: alert and  cooperative; normal mood and affect  LABS:  Results for orders placed or performed during the hospital encounter of 10/23/19 (from the past 24 hour(s))  POCT urine pregnancy     Status: None   Collection Time: 10/23/19 12:28 PM  Result Value Ref Range   Preg Test, Ur Negative Negative     ASSESSMENT & PLAN:  1. Patient request for diagnostic testing   2. Viral URI with cough   3. Suspected COVID-19 virus infection   4. Dark urine     Meds ordered this encounter  Medications  . predniSONE (DELTASONE) 20 MG tablet    Sig: Take 1 tablet (20 mg total) by mouth 2 (two) times daily with a meal for 5 days.    Dispense:  10 tablet    Refill:  0    Order Specific Question:   Supervising Provider    Answer:   Eustace Moore [[id number]]  . benzonatate (TESSALON) 100 MG capsule    Sig: Take 1 capsule (100 mg total) by mouth every 8 (eight) hours.    Dispense:  21 capsule    Refill:  0    Order Specific Question:   Supervising Provider    Answer:   Eustace Moore [[id number]]    COVID testing ordered.  It will take between 2-5 days for test results.  Someone will contact you regarding abnormal results.    In the meantime: You should remain isolated in your home for 10 days from symptom onset AND greater than 72 hours after symptoms resolution (absence of fever without the use of fever-reducing medication and improvement in respiratory symptoms), whichever is longer Get plenty of rest and push fluids Prednisone prescribed.  Take as directed and to completion Tessalon Perles prescribed for cough  Use OTC zyrtec for nasal congestion, runny nose, and/or sore throat Use OTC flonase for nasal congestion and runny nose Use medications daily for symptom relief Use OTC medications like ibuprofen or tylenol as needed fever or pain Call or go to the ED if you have any new or worsening symptoms such as fever, worsening cough, shortness of breath, chest tightness, chest pain, turning blue,  changes in mental status, etc...   Urine without obvious signs of infection at this time Urine culture sent.  We will call you with abnormal results.    Reviewed expectations re: course of current medical issues. Questions answered. Outlined signs and symptoms indicating need for more acute intervention. Patient verbalized understanding. After Visit Summary given.         Rennis Harding, PA-C 10/23/19 1231

## 2019-10-24 LAB — NOVEL CORONAVIRUS, NAA: SARS-CoV-2, NAA: NOT DETECTED

## 2019-10-24 LAB — SARS-COV-2, NAA 2 DAY TAT

## 2019-10-25 ENCOUNTER — Telehealth: Payer: Self-pay

## 2019-10-25 LAB — URINE CULTURE: Culture: NO GROWTH

## 2019-10-25 NOTE — Telephone Encounter (Signed)
Patient called clinic and left message stating she had questions about her medication. Patient called back. She states she is feeling jittery, having trouble sleeping, and talking fast since starting the prednisone. Per provider Avegno, patient advised to discontinue prednisone. Verbalized understanding, no other questions at this time.

## 2019-12-03 ENCOUNTER — Ambulatory Visit
Admission: EM | Admit: 2019-12-03 | Discharge: 2019-12-03 | Disposition: A | Discharge status: Home / Self Care | Payer: Self-pay | Attending: Emergency Medicine | Admitting: Emergency Medicine

## 2019-12-03 ENCOUNTER — Other Ambulatory Visit: Payer: Self-pay

## 2019-12-03 DIAGNOSIS — R109 Unspecified abdominal pain: Secondary | ICD-10-CM | POA: Insufficient documentation

## 2019-12-03 DIAGNOSIS — N390 Urinary tract infection, site not specified: Secondary | ICD-10-CM | POA: Insufficient documentation

## 2019-12-03 LAB — POCT URINALYSIS DIP (MANUAL ENTRY)
Blood, UA: NEGATIVE
Glucose, UA: 100 mg/dL — AB
Nitrite, UA: POSITIVE — AB
Protein Ur, POC: 100 mg/dL — AB
Spec Grav, UA: 1.02 (ref 1.010–1.025)
Urobilinogen, UA: 2 E.U./dL — AB
pH, UA: 5.5 (ref 5.0–8.0)

## 2019-12-03 LAB — POCT URINE PREGNANCY: Preg Test, Ur: NEGATIVE

## 2019-12-03 MED ORDER — NITROFURANTOIN MONOHYD MACRO 100 MG PO CAPS
100.0000 mg | ORAL_CAPSULE | Freq: Two times a day (BID) | ORAL | 0 refills | Status: DC
Start: 2019-12-03 — End: 2020-01-24

## 2019-12-03 NOTE — ED Provider Notes (Signed)
[facility]   Chief Complaint  Patient presents with  . Flank Pain     SUBJECTIVE:  [name] is a 34 y.o. female with history of kidney stone presented to the urgent care for complaint of right flank pain for the past few days.  Patient denies a precipitating event, recent sexual encounter, excessive caffeine intake.  Localizes the pain to the lower right flank.  Pain is intermittent and described as sharp.  Has tried OTC medications without relief.  Symptoms are made worse with urination.  Admits to similar symptoms in the past.  Denies fever, chills, nausea, vomiting, abdominal pain, abnormal vaginal discharge or bleeding, hematuria.    LMP: Patient's last menstrual period was 11/02/2019.  ROS: As in HPI.  All other pertinent ROS negative.     Past Medical History:  Diagnosis Date  . Hypertension   . Knee pain    Past Surgical History:  Procedure Laterality Date  . APPENDECTOMY    . CESAREAN SECTION    . KNEE SURGERY     Allergies  Allergen Reactions  . Sodium Hypochlorite Shortness Of Breath  . Keflex [Cephalexin]   . Latex   . Skin Bleaching [Hydroquinone]     Facial swelling, SOB   No current facility-administered medications on file prior to encounter.   Current Outpatient Medications on File Prior to Encounter  Medication Sig Dispense Refill  . benzonatate (TESSALON) 100 MG capsule Take 1 capsule (100 mg total) by mouth every 8 (eight) hours. 21 capsule 0  . [DISCONTINUED] albuterol (VENTOLIN HFA) 108 (90 Base) MCG/ACT inhaler Inhale 2 puffs into the lungs every 4 (four) hours as needed.    . [DISCONTINUED] cetirizine (ZYRTEC ALLERGY) 10 MG tablet Take 1 tablet (10 mg total) by mouth daily. 30 tablet 0  . [DISCONTINUED] fluticasone (FLONASE) 50 MCG/ACT nasal spray Place 1 spray into both nostrils daily for 14 days. 16 g 0   Social History   Socioeconomic History  . Marital status: Single    Spouse name: Not on file  . Number of children: Not on  file  . Years of education: Not on file  . Highest education level: Not on file  Occupational History  . Not on file  Tobacco Use  . Smoking status: Never Smoker  . Smokeless tobacco: Never Used  Vaping Use  . Vaping Use: Never used  Substance and Sexual Activity  . Alcohol use: No  . Drug use: Yes    Types: Marijuana    Comment: last used 02/20/13  . Sexual activity: Yes    Birth control/protection: None  Other Topics Concern  . Not on file  Social History Narrative  . Not on file   Social Determinants of Health   Financial Resource Strain:   . Difficulty of Paying Living Expenses: Not on file  Food Insecurity:   . Worried About Programme researcher, broadcasting/film/video in the Last Year: Not on file  . Ran Out of Food in the Last Year: Not on file  Transportation Needs:   . Lack of Transportation (Medical): Not on file  . Lack of Transportation (Non-Medical): Not on file  Physical Activity:   . Days of Exercise per Week: Not on file  . Minutes of Exercise per Session: Not on file  Stress:   . Feeling of Stress : Not on file  Social Connections:   . Frequency of Communication with Friends and Family: Not on file  . Frequency of Social Gatherings with  Friends and Family: Not on file  . Attends Religious Services: Not on file  . Active Member of Clubs or Organizations: Not on file  . Attends Banker Meetings: Not on file  . Marital Status: Not on file  Intimate Partner Violence:   . Fear of Current or Ex-Partner: Not on file  . Emotionally Abused: Not on file  . Physically Abused: Not on file  . Sexually Abused: Not on file   Family History  Problem Relation Age of Onset  . Hypertension Mother   . COPD Mother   . Healthy Father     OBJECTIVE:  Vitals:   12/03/19 1127  BP: (!) 139/92  Pulse: 91  Resp: 16  Temp: 98.4 F (36.9 C)  TempSrc: Oral  SpO2: 97%   General appearance: AOx3 in no acute distress HEENT: NCAT.  Oropharynx clear.  Lungs: clear to  auscultation bilaterally without adventitious breath sounds Heart: regular rate and rhythm.  Radial pulses 2+ symmetrical bilaterally Abdomen: soft; non-distended; no tenderness; bowel sounds present; no guarding or rebound tenderness Back: right CVA tenderness Extremities: no edema; symmetrical with no gross deformities Skin: warm and dry Neurologic: Ambulates from chair to exam table without difficulty Psychological: alert and cooperative; normal mood and affect  Labs Reviewed  POCT URINALYSIS DIP (MANUAL ENTRY) - Abnormal; Notable for the following components:      Result Value   Color, UA orange (*)    Glucose, UA =100 (*)    Bilirubin, UA small (*)    Ketones, POC UA trace (5) (*)    Protein Ur, POC =100 (*)    Urobilinogen, UA 2.0 (*)    Nitrite, UA Positive (*)    Leukocytes, UA Large (3+) (*)    All other components within normal limits  URINE CULTURE  POCT URINE PREGNANCY    ASSESSMENT & PLAN:  1. Right flank pain   2. Acute lower UTI     Meds ordered this encounter  Medications  . nitrofurantoin, macrocrystal-monohydrate, (MACROBID) 100 MG capsule    Sig: Take 1 capsule (100 mg total) by mouth 2 (two) times daily.    Dispense:  10 capsule    Refill:  0   Discharge instructions Urine culture sent.  We will call you with the results.   Push fluids and get plenty of rest.   Take antibiotic as directed and to completion Continue to take Azo as needed for symptomatic relief Follow up with PCP if symptoms persists Return here or go to ER if you have any new or worsening symptoms such as fever, worsening abdominal pain, nausea/vomiting, flank pain, etc...  Outlined signs and symptoms indicating need for more acute intervention. Patient verbalized understanding. After Visit Summary given.  Note: This document was prepared using Dragon voice recognition software and may include unintentional dictation errors.    Durward Parcel, FNP 12/03/19 1151

## 2019-12-03 NOTE — Discharge Instructions (Addendum)
Urine culture sent.  We will call you with the results.   Push fluids and get plenty of rest.   Take antibiotic as directed and to completion Continue to take Azo as needed for symptomatic relief Follow up with PCP if symptoms persists Return here or go to ER if you have any new or worsening symptoms such as fever, worsening abdominal pain, nausea/vomiting, flank pain, etc... 

## 2019-12-03 NOTE — ED Triage Notes (Signed)
Pt presents with complaints of right flank pain. Reports hx of kidney stone. Pt also complains of increased urinary frequency. Reports minimal relief with ibuprofen at home. Denies any other symptoms.

## 2019-12-06 LAB — URINE CULTURE: Culture: >=100000 — AB

## 2020-01-24 ENCOUNTER — Other Ambulatory Visit: Payer: Self-pay

## 2020-01-24 ENCOUNTER — Telehealth: Payer: Self-pay

## 2020-01-24 ENCOUNTER — Ambulatory Visit
Admission: EM | Admit: 2020-01-24 | Discharge: 2020-01-24 | Disposition: A | Discharge status: Home / Self Care | Payer: Self-pay | Attending: Emergency Medicine | Admitting: Emergency Medicine

## 2020-01-24 ENCOUNTER — Encounter: Payer: Self-pay | Admitting: Emergency Medicine

## 2020-01-24 DIAGNOSIS — N3001 Acute cystitis with hematuria: Secondary | ICD-10-CM | POA: Insufficient documentation

## 2020-01-24 DIAGNOSIS — R300 Dysuria: Secondary | ICD-10-CM | POA: Insufficient documentation

## 2020-01-24 LAB — POCT URINALYSIS DIP (MANUAL ENTRY)
Bilirubin, UA: NEGATIVE
Glucose, UA: 100 mg/dL — AB
Ketones, POC UA: NEGATIVE mg/dL
Nitrite, UA: POSITIVE — AB
Protein Ur, POC: 30 mg/dL — AB
Spec Grav, UA: 1.02 (ref 1.010–1.025)
Urobilinogen, UA: 2 E.U./dL — AB
pH, UA: 7.5 (ref 5.0–8.0)

## 2020-01-24 LAB — POCT URINE PREGNANCY: Preg Test, Ur: NEGATIVE

## 2020-01-24 MED ORDER — NITROFURANTOIN MONOHYD MACRO 100 MG PO CAPS
100.0000 mg | ORAL_CAPSULE | Freq: Two times a day (BID) | ORAL | 0 refills | Status: DC
Start: 2020-01-24 — End: 2020-01-24

## 2020-01-24 MED ORDER — PHENAZOPYRIDINE HCL 200 MG PO TABS
200.0000 mg | ORAL_TABLET | Freq: Three times a day (TID) | ORAL | 0 refills | Status: DC
Start: 2020-01-24 — End: 2020-01-24

## 2020-01-24 MED ORDER — NITROFURANTOIN MONOHYD MACRO 100 MG PO CAPS
100.0000 mg | ORAL_CAPSULE | Freq: Two times a day (BID) | ORAL | 0 refills | Status: DC
Start: 2020-01-24 — End: 2021-02-02

## 2020-01-24 MED ORDER — PHENAZOPYRIDINE HCL 200 MG PO TABS
200.0000 mg | ORAL_TABLET | Freq: Three times a day (TID) | ORAL | 0 refills | Status: DC
Start: 2020-01-24 — End: 2021-02-02

## 2020-01-24 MED ORDER — PHENAZOPYRIDINE HCL 200 MG PO TABS: 200.0000 mg | ORAL_TABLET | Freq: Three times a day (TID) | ORAL | 0 refills | Status: DC

## 2020-01-24 MED ORDER — NITROFURANTOIN MONOHYD MACRO 100 MG PO CAPS: 100.0000 mg | ORAL_CAPSULE | Freq: Two times a day (BID) | ORAL | 0 refills | Status: DC

## 2020-01-24 NOTE — ED Provider Notes (Signed)
[facility]   CC: Burning with urination  SUBJECTIVE:  [name] is a 34 y.o. female who complains of urinary frequency, urgency and dysuria for the past 3 days.  Patient denies a precipitating event, recent sexual encounter, excessive caffeine intake.  Recently finished menstrual cycle.  Denies abdominal or flank pain.  Has tried OTC AZO without relief.  Symptoms are made worse with urination.  Admits to similar symptoms in the past.  Complains of low back pain, nausea, and a couple of episodes of vomiting.  Denies fever, chills, abdominal pain, flank pain, abnormal vaginal discharge or bleeding, hematuria.    LMP: No LMP recorded.  ROS: As in HPI.  All other pertinent ROS negative.     Past Medical History:  Diagnosis Date   Hypertension    Knee pain    Past Surgical History:  Procedure Laterality Date   APPENDECTOMY     CESAREAN SECTION     KNEE SURGERY     Allergies  Allergen Reactions   Sodium Hypochlorite Shortness Of Breath   Keflex [Cephalexin]    Latex    Skin Bleaching [Hydroquinone]     Facial swelling, SOB   No current facility-administered medications on file prior to encounter.   Current Outpatient Medications on File Prior to Encounter  Medication Sig Dispense Refill   [DISCONTINUED] albuterol (VENTOLIN HFA) 108 (90 Base) MCG/ACT inhaler Inhale 2 puffs into the lungs every 4 (four) hours as needed.     [DISCONTINUED] cetirizine (ZYRTEC ALLERGY) 10 MG tablet Take 1 tablet (10 mg total) by mouth daily. 30 tablet 0   [DISCONTINUED] fluticasone (FLONASE) 50 MCG/ACT nasal spray Place 1 spray into both nostrils daily for 14 days. 16 g 0   Social History   Socioeconomic History   Marital status: Single    Spouse name: Not on file   Number of children: Not on file   Years of education: Not on file   Highest education level: Not on file  Occupational History   Not on file  Tobacco Use   Smoking status: Never Smoker    Smokeless tobacco: Never Used  Vaping Use   Vaping Use: Never used  Substance and Sexual Activity   Alcohol use: No   Drug use: Yes    Types: Marijuana    Comment: last used 02/20/13   Sexual activity: Yes    Birth control/protection: None  Other Topics Concern   Not on file  Social History Narrative   Not on file   Social Determinants of Health   Financial Resource Strain:    Difficulty of Paying Living Expenses: Not on file  Food Insecurity:    Worried About Programme researcher, broadcasting/film/video in the Last Year: Not on file   [employer] of Food in the Last Year: Not on file  Transportation Needs:    Lack of Transportation (Medical): Not on file   Lack of Transportation (Non-Medical): Not on file  Physical Activity:    Days of Exercise per Week: Not on file   Minutes of Exercise per Session: Not on file  Stress:    Feeling of Stress : Not on file  Social Connections:    Frequency of Communication with Friends and Family: Not on file   Frequency of Social Gatherings with Friends and Family: Not on file   Attends Religious Services: Not on file   Active Member of Clubs or Organizations: Not on file   Attends Banker Meetings: Not on  file   Marital Status: Not on file  Intimate Partner Violence:    Fear of Current or Ex-Partner: Not on file   Emotionally Abused: Not on file   Physically Abused: Not on file   Sexually Abused: Not on file   Family History  Problem Relation Age of Onset   Hypertension Mother    COPD Mother    Healthy Father     OBJECTIVE:  Vitals:   01/24/20 0921  BP: (!) 133/99  Pulse: 84  Resp: 17  Temp: 98.3 F (36.8 C)  TempSrc: Oral  SpO2: 98%   General appearance: Alert in no acute distress HEENT: NCAT.  Oropharynx clear.  Lungs: clear to auscultation bilaterally without adventitious breath sounds Heart: regular rate and rhythm.  Abdomen: soft; non-distended; no tenderness; bowel sounds present; no guarding    Back: no CVA tenderness Extremities: no edema; symmetrical with no gross deformities Skin: warm and dry Neurologic: Ambulates from chair to exam table without difficulty Psychological: alert and cooperative; normal mood and affect  Labs Reviewed  POCT URINALYSIS DIP (MANUAL ENTRY) - Abnormal; Notable for the following components:      Result Value   Color, UA straw (*)    Clarity, UA cloudy (*)    Glucose, UA =100 (*)    Blood, UA trace-intact (*)    Protein Ur, POC =30 (*)    Urobilinogen, UA 2.0 (*)    Nitrite, UA Positive (*)    Leukocytes, UA Small (1+) (*)    All other components within normal limits  URINE CULTURE  POCT URINE PREGNANCY    ASSESSMENT & PLAN:  1. Dysuria   2. Acute cystitis with hematuria     Meds ordered this encounter  Medications   nitrofurantoin, macrocrystal-monohydrate, (MACROBID) 100 MG capsule    Sig: Take 1 capsule (100 mg total) by mouth 2 (two) times daily.    Dispense:  10 capsule    Refill:  0    Order Specific Question:   Supervising Provider    Answer:   Eustace Moore [[id number]]   phenazopyridine (PYRIDIUM) 200 MG tablet    Sig: Take 1 tablet (200 mg total) by mouth 3 (three) times daily.    Dispense:  6 tablet    Refill:  0    Order Specific Question:   Supervising Provider    Answer:   Eustace Moore [[id number]]   Urine concerning for infection Urine culture sent.  We will call you with the results.   Push fluids and get plenty of rest.   Take antibiotic as directed and to completion Take pyridium as prescribed and as needed for symptomatic relief Follow up with PCP if symptoms persists Return here or go to ER if you have any new or worsening symptoms such as fever, worsening abdominal pain, nausea/vomiting, flank pain, etc...  Outlined signs and symptoms indicating need for more acute intervention. Patient verbalized understanding. After Visit Summary given.     Rennis Harding, PA-C 01/24/20 [phone]

## 2020-01-24 NOTE — Discharge Instructions (Signed)
Urine concerning for infection Urine culture sent.  We will call you with the results.   Push fluids and get plenty of rest.   Take antibiotic as directed and to completion Take pyridium as prescribed and as needed for symptomatic relief Follow up with PCP if symptoms persists Return here or go to ER if you have any new or worsening symptoms such as fever, worsening abdominal pain, nausea/vomiting, flank pain, etc... 

## 2020-01-24 NOTE — ED Triage Notes (Signed)
Pt triaged and dc  by provider  

## 2020-01-26 LAB — URINE CULTURE: Culture: >=100000 — AB

## 2020-03-19 ENCOUNTER — Other Ambulatory Visit: Payer: Self-pay

## 2020-03-19 ENCOUNTER — Ambulatory Visit
Admission: EM | Admit: 2020-03-19 | Discharge: 2020-03-19 | Disposition: A | Discharge status: Home / Self Care | Payer: Self-pay | Attending: Emergency Medicine | Admitting: Emergency Medicine

## 2020-03-19 ENCOUNTER — Emergency Department (HOSPITAL_COMMUNITY)
Admission: EM | Admit: 2020-03-19 | Discharge: 2020-03-19 | Disposition: A | Discharge status: Home / Self Care | Payer: Self-pay

## 2020-03-19 ENCOUNTER — Encounter: Payer: Self-pay | Admitting: Emergency Medicine

## 2020-03-19 DIAGNOSIS — R509 Fever, unspecified: Secondary | ICD-10-CM

## 2020-03-19 DIAGNOSIS — J069 Acute upper respiratory infection, unspecified: Secondary | ICD-10-CM

## 2020-03-19 DIAGNOSIS — Z1152 Encounter for screening for COVID-19: Secondary | ICD-10-CM

## 2020-03-19 MED ORDER — CETIRIZINE HCL 10 MG PO TABS: 10.0000 mg | ORAL_TABLET | Freq: Every day | ORAL | 0 refills | Status: DC

## 2020-03-19 MED ORDER — FLUTICASONE PROPIONATE 50 MCG/ACT NA SUSP: 1.0000 | Freq: Every day | NASAL | 0 refills | Status: DC

## 2020-03-19 MED ORDER — PREDNISONE 10 MG PO TABS: 20.0000 mg | ORAL_TABLET | Freq: Every day | ORAL | 0 refills | Status: DC

## 2020-03-19 MED ORDER — BENZONATATE 100 MG PO CAPS: 100.0000 mg | ORAL_CAPSULE | Freq: Three times a day (TID) | ORAL | 0 refills | Status: DC

## 2020-03-19 NOTE — ED Triage Notes (Signed)
cough congestion, vomiting from the mucus, headache x 3 days

## 2020-03-19 NOTE — ED Provider Notes (Signed)
[facility]   [id number] 03/19/20 Arrival Time: 1121   Chief Complaint  Patient presents with  . Cough     SUBJECTIVE: History from: patient.  [name] is a 34 y.o. female who presented to the urgent care with a complaint of fever, nasal congestion, cough and headache for the past 3 days.  Denies sick exposure to COVID, flu or strep.  Denies recent travel.  Has tried OTC medication without relief.  Denies aggravating factors.  Denies previous symptoms in the past.   Denies  fatigue, sinus pain, rhinorrhea, sore throat, SOB, wheezing, chest pain, nausea, changes in bowel or bladder habits.    ROS: As per HPI.  All other pertinent ROS negative.      Past Medical History:  Diagnosis Date  . Hypertension   . Knee pain    Past Surgical History:  Procedure Laterality Date  . APPENDECTOMY    . CESAREAN SECTION    . KNEE SURGERY     Allergies  Allergen Reactions  . Sodium Hypochlorite Shortness Of Breath  . Keflex [Cephalexin]   . Latex   . Skin Bleaching [Hydroquinone]     Facial swelling, SOB   No current facility-administered medications on file prior to encounter.   Current Outpatient Medications on File Prior to Encounter  Medication Sig Dispense Refill  . nitrofurantoin, macrocrystal-monohydrate, (MACROBID) 100 MG capsule Take 1 capsule (100 mg total) by mouth 2 (two) times daily. 10 capsule 0  . phenazopyridine (PYRIDIUM) 200 MG tablet Take 1 tablet (200 mg total) by mouth 3 (three) times daily. 6 tablet 0  . [DISCONTINUED] albuterol (VENTOLIN HFA) 108 (90 Base) MCG/ACT inhaler Inhale 2 puffs into the lungs every 4 (four) hours as needed.     Social History   Socioeconomic History  . Marital status: Single    Spouse name: Not on file  . Number of children: Not on file  . Years of education: Not on file  . Highest education level: Not on file  Occupational History  . Not on file  Tobacco Use  . Smoking status: Never Smoker  . Smokeless tobacco:  Never Used  Vaping Use  . Vaping Use: Never used  Substance and Sexual Activity  . Alcohol use: No  . Drug use: Yes    Types: Marijuana    Comment: last used 02/20/13  . Sexual activity: Yes    Birth control/protection: None  Other Topics Concern  . Not on file  Social History Narrative  . Not on file   Social Determinants of Health   Financial Resource Strain: Not on file  Food Insecurity: Not on file  Transportation Needs: Not on file  Physical Activity: Not on file  Stress: Not on file  Social Connections: Not on file  Intimate Partner Violence: Not on file   Family History  Problem Relation Age of Onset  . Hypertension Mother   . COPD Mother   . Healthy Father     OBJECTIVE:  Vitals:   03/19/20 1146  BP: (!) 135/94  Pulse: 100  Resp: 16  Temp: 98.6 F (37 C)  SpO2: 98%     General appearance: alert; appears fatigued, but nontoxic; speaking in full sentences and tolerating own secretions HEENT: NCAT; Ears: EACs clear, TMs pearly gray; Eyes: PERRL.  EOM grossly intact. Sinuses: nontender; Nose: nares patent without rhinorrhea, Throat: oropharynx clear, tonsils non erythematous or enlarged, uvula midline  Neck: supple without LAD Lungs: unlabored respirations, symmetrical air entry; cough:  moderate; no respiratory distress; CTAB Heart: regular rate and rhythm.  Radial pulses 2+ symmetrical bilaterally Skin: warm and dry Psychological: alert and cooperative; normal mood and affect  LABS:  No results found for this or any previous visit (from the past 24 hour(s)).   ASSESSMENT & PLAN:  1. URI with cough and congestion   2. Encounter for screening for COVID-19   3. Fever, unspecified     Meds ordered this encounter  Medications  . cetirizine (ZYRTEC ALLERGY) 10 MG tablet    Sig: Take 1 tablet (10 mg total) by mouth daily.    Dispense:  30 tablet    Refill:  0  . benzonatate (TESSALON) 100 MG capsule    Sig: Take 1 capsule (100 mg total) by mouth  every 8 (eight) hours.    Dispense:  30 capsule    Refill:  0  . fluticasone (FLONASE) 50 MCG/ACT nasal spray    Sig: Place 1 spray into both nostrils daily for 14 days.    Dispense:  16 g    Refill:  0  . predniSONE (DELTASONE) 10 MG tablet    Sig: Take 2 tablets (20 mg total) by mouth daily.    Dispense:  15 tablet    Refill:  0   Discharge instructions  COVID for 19 flu A/B testing ordered.  It will take between 2-7 days for test results.  Someone will contact you regarding abnormal results.    In the meantime: You should remain isolated in your home for 10 days from symptom onset AND greater than 72 hours after symptoms resolution (absence of fever without the use of fever-reducing medication and improvement in respiratory symptoms), whichever is longer Get plenty of rest and push fluids Tessalon Perles prescribed for cough Zyrtec c for nasal congestion, runny nose, and/or sore throat Flonase for nasal congestion and runny nose Prednisone was prescribed Use medications daily for symptom relief Use OTC medications like ibuprofen or tylenol as needed fever or pain Call or go to the ED if you have any new or worsening symptoms such as fever, worsening cough, shortness of breath, chest tightness, chest pain, turning blue, changes in mental status, etc...   Reviewed expectations re: course of current medical issues. Questions answered. Outlined signs and symptoms indicating need for more acute intervention. Patient verbalized understanding. After Visit Summary given.         Durward Parcel, FNP 03/19/20 1248

## 2020-03-19 NOTE — Discharge Instructions (Addendum)
COVID for 19 flu A/B testing ordered.  It will take between 2-7 days for test results.  Someone will contact you regarding abnormal results.    In the meantime: You should remain isolated in your home for 10 days from symptom onset AND greater than 72 hours after symptoms resolution (absence of fever without the use of fever-reducing medication and improvement in respiratory symptoms), whichever is longer Get plenty of rest and push fluids Tessalon Perles prescribed for cough Zyrtec c for nasal congestion, runny nose, and/or sore throat Flonase for nasal congestion and runny nose Prednisone was prescribed Use medications daily for symptom relief Use OTC medications like ibuprofen or tylenol as needed fever or pain Call or go to the ED if you have any new or worsening symptoms such as fever, worsening cough, shortness of breath, chest tightness, chest pain, turning blue, changes in mental status, etc..Marland Kitchen

## 2020-03-21 LAB — COVID-19, FLU A+B NAA
Influenza A, NAA: NOT DETECTED
Influenza B, NAA: NOT DETECTED
SARS-CoV-2, NAA: NOT DETECTED

## 2020-03-27 ENCOUNTER — Other Ambulatory Visit: Payer: Self-pay

## 2020-03-27 ENCOUNTER — Emergency Department (HOSPITAL_COMMUNITY): Payer: Self-pay

## 2020-03-27 ENCOUNTER — Encounter (HOSPITAL_COMMUNITY): Payer: Self-pay

## 2020-03-27 ENCOUNTER — Emergency Department (HOSPITAL_COMMUNITY)
Admission: EM | Admit: 2020-03-27 | Discharge: 2020-03-28 | Disposition: A | Discharge status: Home / Self Care | Payer: Self-pay | Attending: Emergency Medicine | Admitting: Emergency Medicine

## 2020-03-27 DIAGNOSIS — I10 Essential (primary) hypertension: Secondary | ICD-10-CM | POA: Insufficient documentation

## 2020-03-27 DIAGNOSIS — Z20822 Contact with and (suspected) exposure to covid-19: Secondary | ICD-10-CM | POA: Insufficient documentation

## 2020-03-27 DIAGNOSIS — N12 Tubulo-interstitial nephritis, not specified as acute or chronic: Secondary | ICD-10-CM | POA: Insufficient documentation

## 2020-03-27 LAB — CBC WITH DIFFERENTIAL/PLATELET
Abs Immature Granulocytes: 0.03 10*3/uL (ref 0.00–0.07)
Basophils Absolute: 0 10*3/uL (ref 0.0–0.1)
Basophils Relative: 0 %
Eosinophils Absolute: 0 10*3/uL (ref 0.0–0.5)
Eosinophils Relative: 0 %
HCT: 37.3 % (ref 36.0–46.0)
Hemoglobin: 12.6 g/dL (ref 12.0–15.0)
Immature Granulocytes: 0 %
Lymphocytes Relative: 9 %
Lymphs Abs: 0.7 10*3/uL (ref 0.7–4.0)
MCH: 31.3 pg (ref 26.0–34.0)
MCHC: 33.8 g/dL (ref 30.0–36.0)
MCV: 92.8 fL (ref 80.0–100.0)
Monocytes Absolute: 0.6 10*3/uL (ref 0.1–1.0)
Monocytes Relative: 7 %
Neutro Abs: 6.5 10*3/uL (ref 1.7–7.7)
Neutrophils Relative %: 84 %
Platelets: 228 10*3/uL (ref 150–400)
RBC: 4.02 MIL/uL (ref 3.87–5.11)
RDW: 11.9 % (ref 11.5–15.5)
WBC: 7.8 10*3/uL (ref 4.0–10.5)
nRBC: 0 % (ref 0.0–0.2)

## 2020-03-27 LAB — POC URINE PREG, ED: Preg Test, Ur: NEGATIVE

## 2020-03-27 MED ORDER — LACTATED RINGERS IV BOLUS
1000.0000 mL | Freq: Once | INTRAVENOUS | Status: AC
  Administered 2020-03-27: 1000 mL via INTRAVENOUS

## 2020-03-27 MED ORDER — ONDANSETRON HCL 4 MG/2ML IJ SOLN
4.0000 mg | Freq: Once | INTRAMUSCULAR | Status: AC
  Administered 2020-03-27: 4 mg via INTRAVENOUS
  Filled 2020-03-27: qty 2

## 2020-03-27 MED ORDER — ACETAMINOPHEN 500 MG PO TABS
1000.0000 mg | ORAL_TABLET | Freq: Once | ORAL | Status: AC
  Administered 2020-03-27: 1000 mg via ORAL
  Filled 2020-03-27: qty 2

## 2020-03-27 NOTE — ED Triage Notes (Signed)
Patient reports cough, shortness of breath, vomiting x 2 weeks.

## 2020-03-27 NOTE — ED Provider Notes (Signed)
[facility] Emergency Department Provider Note MRN:  [id number]  Arrival date & time: 03/28/20     Chief Complaint   Cough and Fever (No known COVID exposure.  Reports dark urine. )   History of Present Illness   [name] is a 34 y.o. year-old female with a history of hypertension presenting to the ED with chief complaint of cough and fever.  2 weeks of persistent nausea and vomiting.  Denies abdominal pain, no diarrhea.  For the past 2 or 3 days having fever.  She has been having cough during this time.  She is also endorsing body aches, malaise, fatigue.  Denies headache or vision change, no neck pain, no chest pain or shortness of breath, endorsing mild right flank pain, denies dysuria.  Not vaccinated.  Tested negative for Covid and flu 8 days ago.  Review of Systems  A complete 10 system review of systems was obtained and all systems are negative except as noted in the HPI and PMH.   Patient's Health History    Past Medical History:  Diagnosis Date  . Hypertension   . Knee pain     Past Surgical History:  Procedure Laterality Date  . APPENDECTOMY    . CESAREAN SECTION    . KNEE SURGERY      Family History  Problem Relation Age of Onset  . Hypertension Mother   . COPD Mother   . Healthy Father     Social History   Socioeconomic History  . Marital status: Single    Spouse name: Not on file  . Number of children: Not on file  . Years of education: Not on file  . Highest education level: Not on file  Occupational History  . Not on file  Tobacco Use  . Smoking status: Never Smoker  . Smokeless tobacco: Never Used  Vaping Use  . Vaping Use: Never used  Substance and Sexual Activity  . Alcohol use: No  . Drug use: Yes    Types: Marijuana    Comment: last used 02/20/13  . Sexual activity: Yes    Birth control/protection: None  Other Topics Concern  . Not on file  Social History Narrative  . Not on file   Social Determinants of  Health   Financial Resource Strain: Not on file  Food Insecurity: Not on file  Transportation Needs: Not on file  Physical Activity: Not on file  Stress: Not on file  Social Connections: Not on file  Intimate Partner Violence: Not on file     Physical Exam   Vitals:   03/28/20 0130 03/28/20 0200  BP: 115/82 (!) 125/91  Pulse: 99 96  Resp: (!) 21 18  Temp:    SpO2: 97% 98%    CONSTITUTIONAL: Well-appearing, NAD NEURO:  Alert and oriented x 3, no focal deficits EYES:  eyes equal and reactive ENT/NECK:  no LAD, no JVD CARDIO: Tachycardic rate, well-perfused, normal S1 and S2 PULM:  CTAB no wheezing or rhonchi GI/GU:  normal bowel sounds, non-distended, non-tender MSK/SPINE:  No gross deformities, no edema SKIN:  no rash, atraumatic PSYCH:  Appropriate speech and behavior  *Additional and/or pertinent findings included in MDM below  Diagnostic and Interventional Summary    EKG Interpretation  Date/Time:    Ventricular Rate:    PR Interval:    QRS Duration:   QT Interval:    QTC Calculation:   R Axis:     Text Interpretation:  Labs Reviewed  COMPREHENSIVE METABOLIC PANEL - Abnormal; Notable for the following components:      Result Value   Sodium 130 (*)    Potassium 3.4 (*)    CO2 20 (*)    Glucose, Bld 142 (*)    Creatinine, Ser 1.18 (*)    Calcium 8.7 (*)    AST 57 (*)    ALT 65 (*)    Total Bilirubin 1.8 (*)    All other components within normal limits  URINALYSIS, ROUTINE W REFLEX MICROSCOPIC - Abnormal; Notable for the following components:   Color, Urine AMBER (*)    APPearance CLOUDY (*)    Hgb urine dipstick SMALL (*)    Ketones, ur 20 (*)    Protein, ur 100 (*)    Leukocytes,Ua MODERATE (*)    WBC, UA >50 (*)    Bacteria, UA RARE (*)    Non Squamous Epithelial 0-5 (*)    All other components within normal limits  RESP PANEL BY RT-PCR (FLU A&B, COVID) ARPGX2  CULTURE, BLOOD (SINGLE)  URINE CULTURE  LACTIC ACID, PLASMA  LACTIC  ACID, PLASMA  CBC WITH DIFFERENTIAL/PLATELET  PROTIME-INR  APTT  POC URINE PREG, ED    DG Chest Port 1 View  Final Result      Medications  lactated ringers bolus 1,000 mL (0 mLs Intravenous Stopped 03/28/20 0229)  ondansetron (ZOFRAN) injection 4 mg (4 mg Intravenous Given 03/27/20 2351)  acetaminophen (TYLENOL) tablet 1,000 mg (1,000 mg Oral Given 03/27/20 2351)  lactated ringers bolus 1,000 mL (0 mLs Intravenous Stopped 03/28/20 0229)  ciprofloxacin (CIPRO) IVPB 400 mg (0 mg Intravenous Stopped 03/28/20 0230)     Procedures  /  Critical Care Procedures  ED Course and Medical Decision Making  I have reviewed the triage vital signs, the nursing notes, and pertinent available records from the EMR.  Listed above are laboratory and imaging tests that I personally ordered, reviewed, and interpreted and then considered in my medical decision making (see below for details).  Fever, tachycardia, diffuse symptoms, benign abdomen, no significant increased work of breathing, no hypoxia.  Considering COVID-19 with dehydration, pyelonephritis, other viral illness, pneumonia, UTI.  Work-up is pending     Urinalysis is suspicious for infection, with this in the right flank pain and fever there is suspicion for pyelonephritis.  Minimally elevated LFTs, no right upper quadrant tenderness, suspect related to dehydration.  Patient feeling much better after fluids and antibiotics, appropriate for discharge.  Elmer Sow. Pilar Plate, MD [facility] Emergency Medicine [facility] [email or web address]  Final Clinical Impressions(s) / ED Diagnoses     ICD-10-CM   1. Pyelonephritis  N12     ED Discharge Orders         Ordered    ciprofloxacin (CIPRO) 500 MG tablet  Every 12 hours        03/28/20 0229           Discharge Instructions Discussed with and Provided to Patient:     Discharge Instructions     You were evaluated in the Emergency Department and after careful  evaluation, we did not find any emergent condition requiring admission or further testing in the hospital.  Your exam/testing today is overall reassuring.  Your symptoms seem to be due to a kidney infection.  Please take the antibiotics as directed.  Use Tylenol or Motrin for discomfort.  Your Covid test and flu test were negative.  Please return to the Emergency Department if you experience  any worsening of your condition.   Thank you for allowing [place] to be a part of your care.       Sabas Sous, MD 03/28/20 [phone]

## 2020-03-28 LAB — COMPREHENSIVE METABOLIC PANEL
ALT: 65 U/L — ABNORMAL HIGH (ref 0–44)
AST: 57 U/L — ABNORMAL HIGH (ref 15–41)
Albumin: 4 g/dL (ref 3.5–5.0)
Alkaline Phosphatase: 114 U/L (ref 38–126)
Anion gap: 10 (ref 5–15)
BUN: 14 mg/dL (ref 6–20)
CO2: 20 mmol/L — ABNORMAL LOW (ref 22–32)
Calcium: 8.7 mg/dL — ABNORMAL LOW (ref 8.9–10.3)
Chloride: 100 mmol/L (ref 98–111)
Creatinine, Ser: 1.18 mg/dL — ABNORMAL HIGH (ref 0.44–1.00)
GFR, Estimated: >60 mL/min (ref >60)
Glucose, Bld: 142 mg/dL — ABNORMAL HIGH (ref 70–99)
Potassium: 3.4 mmol/L — ABNORMAL LOW (ref 3.5–5.1)
Sodium: 130 mmol/L — ABNORMAL LOW (ref 135–145)
Total Bilirubin: 1.8 mg/dL — ABNORMAL HIGH (ref 0.3–1.2)
Total Protein: 7.6 g/dL (ref 6.5–8.1)

## 2020-03-28 LAB — URINALYSIS, ROUTINE W REFLEX MICROSCOPIC
Bilirubin Urine: NEGATIVE
Glucose, UA: NEGATIVE mg/dL
Ketones, ur: 20 mg/dL — AB
Nitrite: NEGATIVE
Protein, ur: 100 mg/dL — AB
Specific Gravity, Urine: 1.018 (ref 1.005–1.030)
WBC, UA: >50 WBC/hpf — ABNORMAL HIGH (ref 0–5)
pH: 5 (ref 5.0–8.0)

## 2020-03-28 LAB — PROTIME-INR
INR: 1.1 (ref 0.8–1.2)
Prothrombin Time: 14 seconds (ref 11.4–15.2)

## 2020-03-28 LAB — LACTIC ACID, PLASMA
Lactic Acid, Venous: 1.2 mmol/L (ref 0.5–1.9)
Lactic Acid, Venous: 1.3 mmol/L (ref 0.5–1.9)

## 2020-03-28 LAB — RESP PANEL BY RT-PCR (FLU A&B, COVID) ARPGX2
Influenza A by PCR: NEGATIVE
Influenza B by PCR: NEGATIVE
SARS Coronavirus 2 by RT PCR: NEGATIVE

## 2020-03-28 LAB — APTT: aPTT: 33 seconds (ref 24–36)

## 2020-03-28 MED ORDER — CIPROFLOXACIN HCL 500 MG PO TABS: 500.0000 mg | ORAL_TABLET | Freq: Two times a day (BID) | ORAL | 0 refills | Status: AC

## 2020-03-28 MED ORDER — CIPROFLOXACIN IN D5W 400 MG/200ML IV SOLN
400.0000 mg | Freq: Once | INTRAVENOUS | Status: AC
  Administered 2020-03-28: 400 mg via INTRAVENOUS
  Filled 2020-03-28: qty 200

## 2020-03-28 NOTE — Discharge Instructions (Addendum)
You were evaluated in the Emergency Department and after careful evaluation, we did not find any emergent condition requiring admission or further testing in the hospital.  Your exam/testing today is overall reassuring.  Your symptoms seem to be due to a kidney infection.  Please take the antibiotics as directed.  Use Tylenol or Motrin for discomfort.  Your Covid test and flu test were negative.  Please return to the Emergency Department if you experience any worsening of your condition.   Thank you for allowing [place] to be a part of your care.

## 2020-03-29 LAB — URINE CULTURE: Culture: NO GROWTH

## 2020-04-01 LAB — CULTURE, BLOOD (SINGLE)
Culture: NO GROWTH
Special Requests: ADEQUATE

## 2020-04-05 ENCOUNTER — Emergency Department (HOSPITAL_COMMUNITY): Payer: Self-pay

## 2020-04-05 ENCOUNTER — Emergency Department (HOSPITAL_COMMUNITY)
Admission: EM | Admit: 2020-04-05 | Discharge: 2020-04-05 | Disposition: A | Discharge status: Home / Self Care | Payer: Self-pay | Attending: Emergency Medicine | Admitting: Emergency Medicine

## 2020-04-05 ENCOUNTER — Other Ambulatory Visit: Payer: Self-pay

## 2020-04-05 ENCOUNTER — Encounter (HOSPITAL_COMMUNITY): Payer: Self-pay

## 2020-04-05 DIAGNOSIS — M542 Cervicalgia: Secondary | ICD-10-CM | POA: Insufficient documentation

## 2020-04-05 DIAGNOSIS — Z91040 Latex allergy status: Secondary | ICD-10-CM | POA: Insufficient documentation

## 2020-04-05 DIAGNOSIS — I10 Essential (primary) hypertension: Secondary | ICD-10-CM | POA: Insufficient documentation

## 2020-04-05 MED ORDER — CYCLOBENZAPRINE HCL 10 MG PO TABS: 10.0000 mg | ORAL_TABLET | Freq: Two times a day (BID) | ORAL | 0 refills | Status: DC | PRN

## 2020-04-05 MED ORDER — ACETAMINOPHEN 500 MG PO TABS
1000.0000 mg | ORAL_TABLET | Freq: Once | ORAL | Status: AC
  Administered 2020-04-05: 1000 mg via ORAL
  Filled 2020-04-05: qty 2
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
## 2020-04-05 NOTE — Discharge Instructions (Signed)
You have been seen here for neck pain.  I have prescribed you a muscle relaxer please be aware this medication can make you drowsy and i do not recommend taking this medications with alcohol or operating heavy machinery.  I recommend taking this at nighttime.  I recommend taking over-the-counter pain medications like ibuprofen and/or Tylenol every 6 as needed.  Please follow dosage and on the back of bottle.  I also recommend applying heat to the area and stretching out the muscles as this will help decrease stiffness and pain.  I have given you information on exercises please follow.  Given you information for community health and wellness they can help you find a primary care provider  Come back to the emergency department if you develop chest pain, shortness of breath, severe abdominal pain, uncontrolled nausea, vomiting, diarrhea.

## 2020-04-05 NOTE — ED Notes (Signed)
Pt reports she has her children at her parents  She will go there w them  Is here by herself

## 2020-11-03 ENCOUNTER — Other Ambulatory Visit: Payer: Self-pay

## 2020-11-03 ENCOUNTER — Ambulatory Visit
Admission: RE | Admit: 2020-11-03 | Discharge: 2020-11-03 | Disposition: A | Discharge status: Home / Self Care | Payer: Self-pay | Source: Ambulatory Visit | Attending: Family Medicine | Admitting: Family Medicine

## 2020-11-03 VITALS — BP 112/79 | HR 86 | Temp 98.7°F | Resp 16

## 2020-11-03 DIAGNOSIS — N3090 Cystitis, unspecified without hematuria: Secondary | ICD-10-CM | POA: Insufficient documentation

## 2020-11-03 DIAGNOSIS — Z1152 Encounter for screening for COVID-19: Secondary | ICD-10-CM | POA: Insufficient documentation

## 2020-11-03 DIAGNOSIS — B9689 Other specified bacterial agents as the cause of diseases classified elsewhere: Secondary | ICD-10-CM | POA: Insufficient documentation

## 2020-11-03 DIAGNOSIS — B9789 Other viral agents as the cause of diseases classified elsewhere: Secondary | ICD-10-CM | POA: Insufficient documentation

## 2020-11-03 DIAGNOSIS — L089 Local infection of the skin and subcutaneous tissue, unspecified: Secondary | ICD-10-CM | POA: Insufficient documentation

## 2020-11-03 DIAGNOSIS — J028 Acute pharyngitis due to other specified organisms: Secondary | ICD-10-CM | POA: Insufficient documentation

## 2020-11-03 LAB — POCT URINALYSIS DIP (MANUAL ENTRY)
Bilirubin, UA: NEGATIVE
Glucose, UA: NEGATIVE mg/dL
Ketones, POC UA: NEGATIVE mg/dL
Nitrite, UA: NEGATIVE
Spec Grav, UA: >=1.03 — AB (ref 1.010–1.025)
Urobilinogen, UA: 0.2 E.U./dL
pH, UA: 5.5 (ref 5.0–8.0)

## 2020-11-03 LAB — POCT RAPID STREP A (OFFICE): Rapid Strep A Screen: NEGATIVE

## 2020-11-03 MED ORDER — SULFAMETHOXAZOLE-TRIMETHOPRIM 800-160 MG PO TABS: 1.0000 | ORAL_TABLET | Freq: Two times a day (BID) | ORAL | 0 refills | Status: AC

## 2020-11-03 NOTE — Discharge Instructions (Addendum)
You may use over the counter ibuprofen or acetaminophen as needed.  For a sore throat, over the counter products such as [employer] Peroxyl Mouth Sore Rinse or Chloraseptic Sore Throat Spray may provide some temporary relief. Your rapid strep test was negative today. We have sent your throat swab for culture and will let you know of any positive results.  You have been tested for COVID-19 today. If your test returns positive, you will receive a phone call from [facility] regarding your results. Negative test results are not called. Both positive and negative results area always visible on MyChart. If you do not have a MyChart account, sign up instructions are provided in your discharge papers. Please do not hesitate to contact us should you have questions or concerns.  

## 2020-11-03 NOTE — ED Provider Notes (Signed)
[facility]   [id number] 11/03/20 Arrival Time: 1012  ASSESSMENT & PLAN:  1. Encounter for screening for COVID-19   2. Localized bacterial skin infection   3. Cystitis   4. Sore throat (viral)    Rapid strep negative. Discussed typical duration of likely viral illnesses. COVID-19 testing sent. Urine culture sent. Work note provided. OTC symptom care as needed.  No indication for I&D of skin infections.  Begin: Meds ordered this encounter  Medications   sulfamethoxazole-trimethoprim (BACTRIM DS) 800-160 MG tablet    Sig: Take 1 tablet by mouth 2 (two) times daily for 7 days.    Dispense:  14 tablet    Refill:  0     Follow-up Information     [facility].   Specialty: Urgent Care Why: If worsening or failing to improve as anticipated. Contact information: [address] [place] [address] [phone]                Reviewed expectations re: course of current medical issues. Questions answered. Outlined signs and symptoms indicating need for more acute intervention. Understanding verbalized. After Visit Summary given.   SUBJECTIVE: History from: patient. [name] is a 35 y.o. female who reports subj fever, ST, nasal congestion, body aches. Abrupt onset yesterday. Also with urinary frequency and occas dysuria; OTC AZO with some relief. Also with several areas of "skin infection" which she gets frequently; occasionally drain; currently has noted over few days; L inguinal, R upper buttock; under umbilicus. Denies: runny nose, cough, difficulty breathing, and headache. Normal PO intake without n/v/d.   OBJECTIVE:  Vitals:   11/03/20 1023  BP: 112/79  Pulse: 86  Resp: 16  Temp: 98.7 F (37.1 C)  TempSrc: Oral  SpO2: 96%    General appearance: alert; no distress Eyes: PERRLA; EOMI; conjunctiva normal HENT: [place]; AT; with nasal congestion; throat cobblestoning Neck: supple  Lungs:  speaks full sentences without difficulty; unlabored Abd: obese; soft Extremities: no edema Skin: warm and dry; superficial bacterial skin infection of L inguinal, R upper buttock, and just below umbilicus; no areas of TTP or fluctuance Neurologic: normal gait Psychological: alert and cooperative; normal mood and affect  Labs: Results for orders placed or performed during the hospital encounter of 11/03/20  POCT urinalysis dipstick  Result Value Ref Range   Color, UA yellow yellow   Clarity, UA clear clear   Glucose, UA negative negative mg/dL   Bilirubin, UA negative negative   Ketones, POC UA negative negative mg/dL   Spec Grav, UA >=8.185 (A) 1.010 - 1.025   Blood, UA trace-intact (A) negative   pH, UA 5.5 5.0 - 8.0   Protein Ur, POC trace (A) negative mg/dL   Urobilinogen, UA 0.2 0.2 or 1.0 E.U./dL   Nitrite, UA Negative Negative   Leukocytes, UA Trace (A) Negative  POCT rapid strep A  Result Value Ref Range   Rapid Strep A Screen Negative Negative   Labs Reviewed  POCT URINALYSIS DIP (MANUAL ENTRY) - Abnormal; Notable for the following components:      Result Value   Spec Grav, UA >=1.030 (*)    Blood, UA trace-intact (*)    Protein Ur, POC trace (*)    Leukocytes, UA Trace (*)    All other components within normal limits  NOVEL CORONAVIRUS, NAA  URINE CULTURE  POCT RAPID STREP A (OFFICE)    Allergies  Allergen Reactions   Sodium Hypochlorite Shortness Of Breath  Keflex [Cephalexin]    Latex    Skin Bleaching [Hydroquinone]     Facial swelling, SOB    Past Medical History:  Diagnosis Date   Hypertension    Knee pain    Social History   Socioeconomic History   Marital status: Single    Spouse name: Not on file   Number of children: Not on file   Years of education: Not on file   Highest education level: Not on file  Occupational History   Not on file  Tobacco Use   Smoking status: Never   Smokeless tobacco: Never  Vaping Use   Vaping Use: Never  used  Substance and Sexual Activity   Alcohol use: No   Drug use: Not Currently    Types: Marijuana    Comment: last used 02/20/13   Sexual activity: Yes    Birth control/protection: None  Other Topics Concern   Not on file  Social History Narrative   Not on file   Social Determinants of Health   Financial Resource Strain: Not on file  Food Insecurity: Not on file  Transportation Needs: Not on file  Physical Activity: Not on file  Stress: Not on file  Social Connections: Not on file  Intimate Partner Violence: Not on file   Family History  Problem Relation Age of Onset   Hypertension Mother    COPD Mother    Healthy Father    Past Surgical History:  Procedure Laterality Date   APPENDECTOMY     CESAREAN SECTION     KNEE SURGERY       [place], Arlys John, MD 11/03/20 1130

## 2020-11-03 NOTE — ED Triage Notes (Signed)
Patient presents to Urgent Care with complaints of fever, sore throat, and sinus pressure. Pt states she has a hx of sinus infections. Pt treating symptoms with ibuprofen last dose 0300. Pt states she also noted abscesses located on umbilical region, left groin, and right buttock areas. She states the umbilical area abscess she drained and has been applying antibacterial ointment. Pt states she is also concerned with UTI x 2 weeks.  She has hx of UTI's and freq abscesses. Treating UTI symptoms with cranberry juice and Pyridium.

## 2020-11-04 LAB — NOVEL CORONAVIRUS, NAA: SARS-CoV-2, NAA: NOT DETECTED

## 2020-11-04 LAB — SARS-COV-2, NAA 2 DAY TAT

## 2020-11-05 LAB — URINE CULTURE: Culture: 60000 — AB

## 2020-12-21 ENCOUNTER — Other Ambulatory Visit: Payer: Self-pay

## 2020-12-21 ENCOUNTER — Encounter (HOSPITAL_COMMUNITY): Payer: Self-pay | Admitting: Emergency Medicine

## 2020-12-21 ENCOUNTER — Emergency Department (HOSPITAL_COMMUNITY)
Admission: EM | Admit: 2020-12-21 | Discharge: 2020-12-21 | Disposition: A | Discharge status: Home / Self Care | Payer: Self-pay | Attending: Emergency Medicine | Admitting: Emergency Medicine

## 2020-12-21 DIAGNOSIS — N3001 Acute cystitis with hematuria: Secondary | ICD-10-CM | POA: Insufficient documentation

## 2020-12-21 DIAGNOSIS — I10 Essential (primary) hypertension: Secondary | ICD-10-CM | POA: Insufficient documentation

## 2020-12-21 DIAGNOSIS — Z91040 Latex allergy status: Secondary | ICD-10-CM | POA: Insufficient documentation

## 2020-12-21 DIAGNOSIS — Z20822 Contact with and (suspected) exposure to covid-19: Secondary | ICD-10-CM | POA: Insufficient documentation

## 2020-12-21 DIAGNOSIS — J0110 Acute frontal sinusitis, unspecified: Secondary | ICD-10-CM | POA: Insufficient documentation

## 2020-12-21 LAB — URINALYSIS, ROUTINE W REFLEX MICROSCOPIC
Bilirubin Urine: NEGATIVE
Glucose, UA: NEGATIVE mg/dL
Ketones, ur: NEGATIVE mg/dL
Leukocytes,Ua: NEGATIVE
Nitrite: NEGATIVE
Specific Gravity, Urine: >1.03 — ABNORMAL HIGH (ref 1.005–1.030)
pH: 6 (ref 5.0–8.0)

## 2020-12-21 LAB — URINALYSIS, MICROSCOPIC (REFLEX)

## 2020-12-21 LAB — GROUP A STREP BY PCR: Group A Strep by PCR: NOT DETECTED

## 2020-12-21 LAB — POC URINE PREG, ED: Preg Test, Ur: NEGATIVE

## 2020-12-21 LAB — SARS CORONAVIRUS 2 (TAT 6-24 HRS): SARS Coronavirus 2: NEGATIVE

## 2020-12-21 MED ORDER — SULFAMETHOXAZOLE-TRIMETHOPRIM 800-160 MG PO TABS: 1.0000 | ORAL_TABLET | Freq: Two times a day (BID) | ORAL | 0 refills | Status: AC

## 2020-12-21 NOTE — ED Provider Notes (Signed)
[facility] EMERGENCY DEPARTMENT Provider Note   CSN: [id number] Arrival date & time: 12/21/20  7124     History Chief Complaint  Patient presents with   Sore Throat    [name] is a 35 y.o. female with a history of hypertension and frequent UTIs presenting for evaluation of urinary symptoms including increased urinary frequency along with burning with urination.  She denies fevers or chills, nausea or vomiting, flank pain but has had some mild aching in her lower back.  Her urine has been cloudier than normal, denies any gross hematuria.  She also reports an 8-day history of sore throat, nonproductive cough, intermittent sneezing and also reports having sinus pressure specifically along her bilateral forehead  along with purulent nasal discharge along with streaks of blood.  She denies shortness of breath, chest pain.  The history is provided by the patient.      Past Medical History:  Diagnosis Date   Hypertension    Knee pain     There are no problems to display for this patient.   Past Surgical History:  Procedure Laterality Date   APPENDECTOMY     CESAREAN SECTION     KNEE SURGERY       OB History     Gravida  3   Para      Term      Preterm      AB  1   Living  2      SAB  1   IAB      Ectopic      Multiple      Live Births              Family History  Problem Relation Age of Onset   Hypertension Mother    COPD Mother    Healthy Father     Social History   Tobacco Use   Smoking status: Never   Smokeless tobacco: Never  Vaping Use   Vaping Use: Never used  Substance Use Topics   Alcohol use: No   Drug use: Not Currently    Types: Marijuana    Comment: last used 02/20/13    Home Medications Prior to Admission medications   Medication Sig Start Date End Date Taking? Authorizing Provider  sulfamethoxazole-trimethoprim (BACTRIM DS) 800-160 MG tablet Take 1 tablet by mouth 2 (two) times daily for 10 days. 12/21/20 12/31/20  Yes Mark Benecke, Raynelle Fanning, PA-C  benzonatate (TESSALON) 100 MG capsule Take 1 capsule (100 mg total) by mouth every 8 (eight) hours. 03/19/20   Avegno, Zachery Dakins, FNP  cetirizine (ZYRTEC ALLERGY) 10 MG tablet Take 1 tablet (10 mg total) by mouth daily. 03/19/20   Avegno, Zachery Dakins, FNP  cyclobenzaprine (FLEXERIL) 10 MG tablet Take 1 tablet (10 mg total) by mouth 2 (two) times daily as needed for muscle spasms. 04/05/20   Carroll Sage, PA-C  fluticasone (FLONASE) 50 MCG/ACT nasal spray Place 1 spray into both nostrils daily for 14 days. 03/19/20 04/02/20  Avegno, Zachery Dakins, FNP  nitrofurantoin, macrocrystal-monohydrate, (MACROBID) 100 MG capsule Take 1 capsule (100 mg total) by mouth 2 (two) times daily. 01/24/20   Wurst, [place], PA-C  phenazopyridine (PYRIDIUM) 200 MG tablet Take 1 tablet (200 mg total) by mouth 3 (three) times daily. 01/24/20   Wurst, [place], PA-C  predniSONE (DELTASONE) 10 MG tablet Take 2 tablets (20 mg total) by mouth daily. 03/19/20   Avegno, Zachery Dakins, FNP  albuterol (VENTOLIN HFA) 108 (90 Base) MCG/ACT inhaler Inhale  2 puffs into the lungs every 4 (four) hours as needed. 04/09/19 10/23/19  [provider]    Allergies    Sodium hypochlorite, Keflex [cephalexin], Latex, and Skin bleaching [hydroquinone]  Review of Systems   Review of Systems  Constitutional:  Negative for chills and fever.  HENT:  Positive for congestion, rhinorrhea, sinus pressure and sore throat. Negative for ear pain, trouble swallowing and voice change.   Eyes:  Negative for discharge.  Respiratory:  Negative for cough, shortness of breath, wheezing and stridor.   Cardiovascular:  Negative for chest pain.  Gastrointestinal:  Negative for abdominal pain, nausea and vomiting.  Genitourinary:  Positive for dysuria and urgency. Negative for hematuria and vaginal discharge.  Musculoskeletal: Negative.   Skin: Negative.    Physical Exam Updated Vital Signs BP (!) 155/114 (BP Location: Right  Arm)   Pulse 82   Temp 98.3 F (36.8 C) (Oral)   Resp 16   Ht 5\' 6"  (1.676 m)   Wt 104.3 kg   SpO2 100%   BMI 37.12 kg/m   Physical Exam Vitals and nursing note reviewed.  Constitutional:      Appearance: She is well-developed.  HENT:     Head: Normocephalic and atraumatic.     Right Ear: A middle ear effusion is present.     Left Ear: Tympanic membrane normal.     Nose:     Right Sinus: Frontal sinus tenderness present.     Left Sinus: Frontal sinus tenderness present.     Mouth/Throat:     Comments: Aphthous ulcer left lower lip mucosa Eyes:     Conjunctiva/sclera: Conjunctivae normal.  Cardiovascular:     Rate and Rhythm: Normal rate and regular rhythm.     Heart sounds: Normal heart sounds.  Pulmonary:     Effort: Pulmonary effort is normal.     Breath sounds: Normal breath sounds. No wheezing or rhonchi.  Abdominal:     General: Bowel sounds are normal.     Palpations: Abdomen is soft.     Tenderness: There is no abdominal tenderness. There is no right CVA tenderness, left CVA tenderness or guarding.  Musculoskeletal:        General: Normal range of motion.     Cervical back: Normal range of motion.  Skin:    General: Skin is warm and dry.  Neurological:     General: No focal deficit present.     Mental Status: She is alert.    ED Results / Procedures / Treatments   Labs (all labs ordered are listed, but only abnormal results are displayed) Labs Reviewed  URINALYSIS, ROUTINE W REFLEX MICROSCOPIC - Abnormal; Notable for the following components:      Result Value   APPearance HAZY (*)    Specific Gravity, Urine >1.030 (*)    Hgb urine dipstick LARGE (*)    Protein, ur TRACE (*)    All other components within normal limits  URINALYSIS, MICROSCOPIC (REFLEX) - Abnormal; Notable for the following components:   Bacteria, UA MANY (*)    All other components within normal limits  GROUP A STREP BY PCR  SARS CORONAVIRUS 2 (TAT 6-24 HRS)  POC URINE PREG, ED     EKG None  Radiology No results found.  Procedures Procedures   Medications Ordered in ED Medications - No data to display  ED Course  I have reviewed the triage vital signs and the nursing notes.  Pertinent labs & imaging results that were available  during my care of the patient were reviewed by me and considered in my medical decision making (see chart for details).    MDM Rules/Calculators/A&P                           Patient with acute cystitis, no evidence for pyelonephritis.  She was started on Bactrim.  She does have symptoms suggesting also an acute sinusitis.  Her Bactrim was extended for 10-day course to cover her for this condition as well.  She was encouraged to add an antihistamine/decongestant of her choice.  Her strep test is negative, her COVID test is pending at this time, she was advised that she should remain out of work until this test is returned and is negative.  She was given a note to return to work on Wednesday assuming her COVID is negative.  Return precautions were discussed. Final Clinical Impression(s) / ED Diagnoses Final diagnoses:  Acute non-recurrent frontal sinusitis  Acute cystitis with hematuria    Rx / DC Orders ED Discharge Orders          Ordered    sulfamethoxazole-trimethoprim (BACTRIM DS) 800-160 MG tablet  2 times daily        12/21/20 1509             Burgess Amor, PA-C 12/21/20 1549    Bethann Berkshire, MD 12/24/20 [phone]

## 2020-12-21 NOTE — Discharge Instructions (Signed)
You do have a urinary tract infection as discussed.  Take the entire course of the Bactrim which should also cover you for your sinus symptoms.  I suggest taking an antihistamine/decongestant combination such as Claritin-D or Zyrtec-D which will also help with your sinus symptoms.  Your strep test is negative.  Your COVID test is pending.  I suspect this test will probably be negative, however it should result within the next 24 hours.  Since you are not running any fevers and your symptoms began 8 days ago you could technically return to work on Wednesday even if your COVID test is positive as long as you are not running a fever.  Get rechecked for any new or worsening symptoms.

## 2020-12-21 NOTE — ED Triage Notes (Signed)
Pt c/o sore throat, burning with urination, headache, coughing, sneezing that started last Sunday.

## 2021-02-02 ENCOUNTER — Ambulatory Visit
Admission: EM | Admit: 2021-02-02 | Discharge: 2021-02-02 | Disposition: A | Discharge status: Home / Self Care | Payer: Self-pay | Attending: Emergency Medicine | Admitting: Emergency Medicine

## 2021-02-02 ENCOUNTER — Other Ambulatory Visit: Payer: Self-pay

## 2021-02-02 DIAGNOSIS — R82998 Other abnormal findings in urine: Secondary | ICD-10-CM | POA: Insufficient documentation

## 2021-02-02 DIAGNOSIS — N390 Urinary tract infection, site not specified: Secondary | ICD-10-CM | POA: Insufficient documentation

## 2021-02-02 DIAGNOSIS — R102 Pelvic and perineal pain: Secondary | ICD-10-CM | POA: Insufficient documentation

## 2021-02-02 LAB — POCT URINALYSIS DIP (MANUAL ENTRY)
Bilirubin, UA: NEGATIVE
Glucose, UA: NEGATIVE mg/dL
Ketones, POC UA: NEGATIVE mg/dL
Nitrite, UA: NEGATIVE
Protein Ur, POC: NEGATIVE mg/dL
Spec Grav, UA: 1.015 (ref 1.010–1.025)
Urobilinogen, UA: 0.2 E.U./dL
pH, UA: 5.5 (ref 5.0–8.0)

## 2021-02-02 LAB — POCT URINE PREGNANCY: Preg Test, Ur: NEGATIVE

## 2021-02-02 MED ORDER — SULFAMETHOXAZOLE-TRIMETHOPRIM 800-160 MG PO TABS: 1.0000 | ORAL_TABLET | Freq: Two times a day (BID) | ORAL | 0 refills | Status: AC

## 2021-02-02 NOTE — Discharge Instructions (Addendum)
Urinalysis performed in the office today was concerning for urinary tract infection, there was presence of leukocytes and red blood cells in your urine.  We will send your urine to the lab to perform bacterial culture, the results of that test should be available in 3 to 5 days, you will be notified once the final result is available.  In the meantime, I recommend that you begin antibiotic to treat you for presumed lower urinary tract infection.  I have sent your prescription for Bactrim to your pharmacy, please take 1 tablet twice daily until complete

## 2021-02-02 NOTE — ED Triage Notes (Signed)
Pt reports having RLQ, right pelvic pain. Patient states she has been having recurrent UTIs. Patient reports having bladder pressure, she does have lower back pain.   Started: today

## 2021-02-02 NOTE — ED Provider Notes (Signed)
[facility]    CSN: [id number] Arrival date & time: 02/02/21  1502      History   Chief Complaint Chief Complaint  Patient presents with   Back Pain   bladder pressure     HPI [name] is a 35 y.o. female.   Pt reports having RLQ, right pelvic pain. Patient states she has been having recurrent UTIs, last episode was December 2021. Patient reports having bladder pressure, right lower quadrant pain that radiates to her right lower back, states symptoms started today.  Patient denies fever, aches, chills, nausea, vomiting, diarrhea, headache, increased frequency of urination, incomplete emptying, burning with urination.  Urine dip today was remarkable for red blood cells and white blood cells.  The history is provided by the patient.   Past Medical History:  Diagnosis Date   Hypertension    Knee pain     There are no problems to display for this patient.   Past Surgical History:  Procedure Laterality Date   APPENDECTOMY     CESAREAN SECTION     KNEE SURGERY      OB History     Gravida  3   Para      Term      Preterm      AB  1   Living  2      SAB  1   IAB      Ectopic      Multiple      Live Births               Home Medications    Prior to Admission medications   Medication Sig Start Date End Date Taking? Authorizing Provider  sulfamethoxazole-trimethoprim (BACTRIM DS) 800-160 MG tablet Take 1 tablet by mouth 2 (two) times daily for 7 days. 02/02/21 02/09/21 Yes Theadora Rama Scales, PA-C  fluticasone (FLONASE) 50 MCG/ACT nasal spray Place 1 spray into both nostrils daily for 14 days. 03/19/20 04/02/20  Avegno, Zachery Dakins, FNP  albuterol (VENTOLIN HFA) 108 (90 Base) MCG/ACT inhaler Inhale 2 puffs into the lungs every 4 (four) hours as needed. 04/09/19 10/23/19  [provider]    Family History Family History  Problem Relation Age of Onset   Hypertension Mother    COPD Mother    Healthy Father     Social  History Social History   Tobacco Use   Smoking status: Never   Smokeless tobacco: Never  Vaping Use   Vaping Use: Never used  Substance Use Topics   Alcohol use: No   Drug use: Not Currently    Types: Marijuana    Comment: last used 02/20/13     Allergies   Sodium hypochlorite, Keflex [cephalexin], Latex, and Skin bleaching [hydroquinone]   Review of Systems Review of Systems Pertinent findings noted in history of present illness.    Physical Exam Triage Vital Signs ED Triage Vitals  Enc Vitals Group     BP      Pulse      Resp      Temp      Temp src      SpO2      Weight      Height      Head Circumference      Peak Flow      Pain Score      Pain Loc      Pain Edu?      Excl. in GC?    No data  found.  Updated Vital Signs BP (!) 126/91 (BP Location: Right Arm)   Pulse 81   Temp 98.4 F (36.9 C) (Oral)   Resp 18   LMP 01/07/2021   SpO2 97%   Visual Acuity Right Eye Distance:   Left Eye Distance:   Bilateral Distance:    Right Eye Near:   Left Eye Near:    Bilateral Near:     Physical Exam Vitals and nursing note reviewed.  Constitutional:      Appearance: Normal appearance. She is obese. She is not ill-appearing.  HENT:     Head: Normocephalic and atraumatic.  Eyes:     Conjunctiva/sclera: Conjunctivae normal.  Cardiovascular:     Rate and Rhythm: Normal rate and regular rhythm.     Pulses: Normal pulses.     Heart sounds: Normal heart sounds.  Pulmonary:     Effort: Pulmonary effort is normal.     Breath sounds: Normal breath sounds.  Abdominal:     General: Abdomen is flat. Bowel sounds are normal.     Palpations: Abdomen is soft.     Tenderness: There is abdominal tenderness in the right lower quadrant and suprapubic area. There is no right CVA tenderness.  Musculoskeletal:        General: Normal range of motion.     Cervical back: Normal range of motion and neck supple.  Skin:    General: Skin is warm and dry.  Neurological:      General: No focal deficit present.     Mental Status: She is alert and oriented to person, place, and time. Mental status is at baseline.  Psychiatric:        Mood and Affect: Mood normal.        Behavior: Behavior normal.     UC Treatments / Results  Labs (all labs ordered are listed, but only abnormal results are displayed) Labs Reviewed  POCT URINALYSIS DIP (MANUAL ENTRY) - Abnormal; Notable for the following components:      Result Value   Blood, UA moderate (*)    Leukocytes, UA Moderate (2+) (*)    All other components within normal limits  URINE CULTURE  POCT URINE PREGNANCY  CERVICOVAGINAL ANCILLARY ONLY    EKG   Radiology No results found.  Procedures Procedures (including critical care time)  Medications Ordered in UC Medications - No data to display  Initial Impression / Assessment and Plan / UC Course  I have reviewed the triage vital signs and the nursing notes.  Pertinent labs & imaging results that were available during my care of the patient were reviewed by me and considered in my medical decision making (see chart for details).     Presumed acute lower urinary tract infection.  Recommend empiric treatment with Bactrim.  Patient advised to follow-up with her primary care provider should she begin to experience more than 3 urinary tract infections within a 37-month period  Patient verbalized understanding and agreement of plan as discussed.  All questions were addressed during visit.  Please see discharge instructions below for further details of plan.  Final Clinical Impressions(s) / UC Diagnoses   Final diagnoses:  Pelvic pain  Urine leukocytes  Acute lower UTI (urinary tract infection)     Discharge Instructions      Urinalysis performed in the office today was concerning for urinary tract infection, there was presence of leukocytes and red blood cells in your urine.  We will send your urine to the lab to  perform bacterial culture, the  results of that test should be available in 3 to 5 days, you will be notified once the final result is available.  In the meantime, I recommend that you begin antibiotic to treat you for presumed lower urinary tract infection.  I have sent your prescription for Bactrim to your pharmacy, please take 1 tablet twice daily until complete     ED Prescriptions     Medication Sig Dispense Auth. Provider   sulfamethoxazole-trimethoprim (BACTRIM DS) 800-160 MG tablet Take 1 tablet by mouth 2 (two) times daily for 7 days. 14 tablet Theadora Rama Scales, PA-C      PDMP not reviewed this encounter.   Theadora Rama Scales, PA-C 02/02/21 1700

## 2021-02-04 LAB — CERVICOVAGINAL ANCILLARY ONLY
Bacterial Vaginitis (gardnerella): NEGATIVE
Candida Glabrata: NEGATIVE
Candida Vaginitis: NEGATIVE
Chlamydia: NEGATIVE
Comment: NEGATIVE
Comment: NEGATIVE
Comment: NEGATIVE
Comment: NEGATIVE
Comment: NEGATIVE
Comment: NORMAL
Neisseria Gonorrhea: NEGATIVE
Trichomonas: NEGATIVE

## 2021-02-04 LAB — URINE CULTURE: Culture: NO GROWTH

## 2021-03-23 ENCOUNTER — Other Ambulatory Visit: Payer: Self-pay | Admitting: Urology

## 2021-03-29 ENCOUNTER — Other Ambulatory Visit: Payer: Self-pay

## 2021-03-29 ENCOUNTER — Ambulatory Visit
Admission: EM | Admit: 2021-03-29 | Discharge: 2021-03-29 | Disposition: A | Discharge status: Home / Self Care | Payer: Self-pay | Attending: Urgent Care | Admitting: Urgent Care

## 2021-03-29 DIAGNOSIS — Z8709 Personal history of other diseases of the respiratory system: Secondary | ICD-10-CM

## 2021-03-29 DIAGNOSIS — J069 Acute upper respiratory infection, unspecified: Secondary | ICD-10-CM

## 2021-03-29 DIAGNOSIS — R052 Subacute cough: Secondary | ICD-10-CM

## 2021-03-29 DIAGNOSIS — R070 Pain in throat: Secondary | ICD-10-CM

## 2021-03-29 DIAGNOSIS — R0981 Nasal congestion: Secondary | ICD-10-CM

## 2021-03-29 LAB — POCT RAPID STREP A (OFFICE): Rapid Strep A Screen: NEGATIVE

## 2021-03-29 MED ORDER — CETIRIZINE HCL 10 MG PO TABS: 10.0000 mg | ORAL_TABLET | Freq: Every day | ORAL | 0 refills | Status: DC

## 2021-03-29 MED ORDER — PREDNISONE 20 MG PO TABS: ORAL_TABLET | ORAL | 0 refills | Status: DC

## 2021-03-29 MED ORDER — PROMETHAZINE-DM 6.25-15 MG/5ML PO SYRP: 5.0000 mL | ORAL_SOLUTION | Freq: Every evening | ORAL | 0 refills | Status: DC | PRN

## 2021-03-29 MED ORDER — BENZONATATE 100 MG PO CAPS: 100.0000 mg | ORAL_CAPSULE | Freq: Three times a day (TID) | ORAL | 0 refills | Status: DC | PRN

## 2021-03-29 NOTE — ED Triage Notes (Signed)
Pt reports nasal congestion and sneezing x 2 days. Reports sore throat and chest discomfort when coughing.

## 2021-03-29 NOTE — ED Provider Notes (Signed)
[facility]  MRN: [id number] DOB: [birth year]  Subjective:   [name] is a 35 y.o. female presenting for 2-day history of acute onset sinus congestion, postnasal drainage, throat pain, painful swallowing, cough, chest congestion.  Has a history of bronchitis and is worried about having this again.  No chest pain, shortness of breath or wheezing.  Patient does not smoke cigarettes.   No current facility-administered medications for this encounter.  Current Outpatient Medications:    fluticasone (FLONASE) 50 MCG/ACT nasal spray, Place 1 spray into both nostrils daily for 14 days., Disp: 16 g, Rfl: 0    Allergies  Allergen Reactions   Sodium Hypochlorite Shortness Of Breath   Keflex [Cephalexin]    Latex    Skin Bleaching [Hydroquinone]     Facial swelling, SOB    Past Medical History:  Diagnosis Date   Hypertension    Knee pain      Past Surgical History:  Procedure Laterality Date   APPENDECTOMY     CESAREAN SECTION     KNEE SURGERY      Family History  Problem Relation Age of Onset   Hypertension Mother    COPD Mother    Healthy Father     Social History   Tobacco Use   Smoking status: Never   Smokeless tobacco: Never  Vaping Use   Vaping Use: Never used  Substance Use Topics   Alcohol use: No   Drug use: Not Currently    Types: Marijuana    Comment: last used 02/20/13    ROS   Objective:   Vitals: BP (!) 146/81 (BP Location: Right Arm)    Pulse 96    Temp 98.3 F (36.8 C) (Oral)    Resp 20    LMP  (Within Weeks) Comment: 2 weeks   SpO2 96%    Breastfeeding No   Physical Exam Constitutional:      General: She is not in acute distress.    Appearance: Normal appearance. She is well-developed. She is not ill-appearing, toxic-appearing or diaphoretic.  HENT:     Head: Normocephalic and atraumatic.     Nose: Congestion and rhinorrhea present.     Mouth/Throat:     Mouth: Mucous membranes are moist.     Pharynx: No  oropharyngeal exudate or posterior oropharyngeal erythema.  Eyes:     General: No scleral icterus.       Right eye: No discharge.        Left eye: No discharge.     Extraocular Movements: Extraocular movements intact.     Pupils: Pupils are equal, round, and reactive to light.  Cardiovascular:     Rate and Rhythm: Normal rate and regular rhythm.     Pulses: Normal pulses.     Heart sounds: Normal heart sounds. No murmur heard.   No friction rub. No gallop.  Pulmonary:     Effort: Pulmonary effort is normal. No respiratory distress.     Breath sounds: Normal breath sounds. No stridor. No wheezing, rhonchi or rales.  Skin:    General: Skin is warm and dry.     Findings: No rash.  Neurological:     General: No focal deficit present.     Mental Status: She is alert and oriented to person, place, and time.  Psychiatric:        Mood and Affect: Mood normal.        Behavior: Behavior normal.        Thought Content:  Thought content normal.    Results for orders placed or performed during the hospital encounter of 03/29/21 (from the past 24 hour(s))  POCT rapid strep A     Status: None   Collection Time: 03/29/21 10:48 AM  Result Value Ref Range   Rapid Strep A Screen Negative Negative    Assessment and Plan :   PDMP not reviewed this encounter.  1. Viral upper respiratory infection   2. Nasal congestion   3. History of bronchitis   4. Subacute cough   5. Throat pain    Deferred imaging given clear cardiopulmonary exam, hemodynamically stable vital signs. COVID and flu test pending.  We will otherwise manage for viral upper respiratory infection.  In light of her severe cough, chest congestion, history of bronchitis, will be using an oral prednisone course. Physical exam findings reassuring and vital signs stable for discharge. Advised supportive care, offered symptomatic relief. Counseled patient on potential for adverse effects with medications prescribed/recommended today, ER  and return-to-clinic precautions discussed, patient verbalized understanding.      Wallis Bamberg, PA-C 03/29/21 1054

## 2021-03-29 NOTE — Discharge Instructions (Addendum)
We will notify you of your test results as they arrive and may take between 48-72 hours.  I encourage you to sign up for MyChart if you have not already done so as this can be the easiest way for [place] to communicate results to you online or through a phone app.  Generally, we only contact you if it is a positive test result.  In the meantime, if you develop worsening symptoms including fever, chest pain, shortness of breath despite our current treatment plan then please report to the emergency room as this may be a sign of worsening status from possible viral infection.  Otherwise, we will manage this as a viral syndrome. For sore throat or cough try using a honey-based tea. Use 3 teaspoons of honey with juice squeezed from half lemon. Place shaved pieces of ginger into 1/2-1 cup of water and warm over stove top. Then mix the ingredients and repeat every 4 hours as needed. Please take Tylenol 500mg -650mg  every 6 hours for aches and pains, fevers. Hydrate very well with at least 2 liters of water. Eat light meals such as soups to replenish electrolytes and soft fruits, veggies. Start an antihistamine like Zyrtec for postnasal drainage, sinus congestion.  You can take this together with prednisone and the cough medications I prescribed.

## 2021-03-30 ENCOUNTER — Ambulatory Visit: Payer: Self-pay

## 2021-03-30 LAB — COVID-19, FLU A+B NAA
Influenza A, NAA: NOT DETECTED
Influenza B, NAA: NOT DETECTED
SARS-CoV-2, NAA: DETECTED — AB

## 2021-04-13 ENCOUNTER — Other Ambulatory Visit: Payer: Self-pay

## 2021-04-13 ENCOUNTER — Encounter (HOSPITAL_BASED_OUTPATIENT_CLINIC_OR_DEPARTMENT_OTHER): Payer: Self-pay | Admitting: Urology

## 2021-04-13 NOTE — Progress Notes (Signed)
Spoke w/ via phone for pre-op interview---[name] needs dos---- UPT              [name] results------NA COVID test -----patient states asymptomatic no test needed Arrive at -------10:15 04/16/21 NPO after MN NO Solid Food.  Clear liquids from MN until---0715 Med rec completed Medications to take morning of surgery -----none Diabetic medication -----NA Patient instructed no nail polish to be worn day of surgery Patient instructed to bring photo id and insurance card day of surgery Patient aware to have Driver (ride ) / caregiver    for 24 hours after surgery  Patient Special Instructions -----NA Pre-Op special Istructions -----NA Patient verbalized understanding of instructions that were given at this phone interview. Patient denies shortness of breath, chest pain, fever, cough at this phone interview.

## 2021-04-15 NOTE — Anesthesia Preprocedure Evaluation (Addendum)
Anesthesia Evaluation  Patient identified by MRN, date of birth, ID band Patient awake    Reviewed: Allergy & Precautions, NPO status , Patient's Chart, lab work & pertinent test results  History of Anesthesia Complications Negative for: history of anesthetic complications  Airway Mallampati: II  TM Distance: >3 FB Neck ROM: Full    Dental no notable dental hx.    Pulmonary neg pulmonary ROS,    Pulmonary exam normal        Cardiovascular hypertension, Normal cardiovascular exam     Neuro/Psych BPDnegative neurological ROS     GI/Hepatic negative GI ROS, Neg liver ROS,   Endo/Other  BMI 39  Renal/GU RIGHT URETERAL AND RENAL STONE  negative genitourinary   Musculoskeletal negative musculoskeletal ROS (+)   Abdominal   Peds  Hematology negative hematology ROS (+)   Anesthesia Other Findings Day of surgery medications reviewed with patient.  Reproductive/Obstetrics negative OB ROS                            Anesthesia Physical Anesthesia Plan  ASA: 2  Anesthesia Plan: General   Post-op Pain Management: Tylenol PO (pre-op)   Induction: Intravenous  PONV Risk Score and Plan: 4 or greater and Treatment may vary due to age or medical condition, Ondansetron, Dexamethasone, Midazolam and Scopolamine patch - Pre-op  Airway Management Planned: LMA  Additional Equipment: None  Intra-op Plan:   Post-operative Plan: Extubation in OR  Informed Consent: I have reviewed the patients History and Physical, chart, labs and discussed the procedure including the risks, benefits and alternatives for the proposed anesthesia with the patient or authorized representative who has indicated his/her understanding and acceptance.     Dental advisory given  Plan Discussed with: CRNA  Anesthesia Plan Comments:        Anesthesia Quick Evaluation

## 2021-04-15 NOTE — H&P (Signed)
I have ureteral stone.  HPI: [name] is a 36 year-old female established patient who is here for ureteral stone.  [name] is a 36 yo female who had the onset of symptoms a few weeks ago with pain in the right lower back. She was in the ER on Monday and was found to have a 6 mm right distal stone with some obstruction and a 2 mm right renal stone. She has passed a stone about 6 months ago and she has had episodes about every 6-8 months for 2 years. She is on tamsulosin an pain meds. She has had hematuria and has some urgency.   03/17/2021: 36 year old female who presents today for follow-up of a 6 mm right distal ureteral stone causing mild obstruction as well as a 2 mm right renal stone. She has not seen a stone pass. She continues to have pain and discomfort. She denies fevers but endorses urinary frequency and urgency as well as right flank pain and discomfort that is intermittent.     ALLERGIES: Bleach Keflex Latex    MEDICATIONS: Tamsulosin Hcl 0.4 mg capsule 1 capsule PO Daily  Hydrocodone Bitartrate Er  Hydrocodone-Acetaminophen 5 mg-325 mg tablet 1 tablet PO Q 6 H PRN  Ibuprofen     GU PSH: No GU PSH      PSH Notes: Knee surgery- 2003, tube ties   NON-GU PSH: Appendectomy     GU PMH: Renal calculus, She has a tiny right upper pole stone that could be addressed if she requires ureteroscopy. - 03/03/2021 Ureteral calculus, She has a 4x26m right distal stone. I have recommended a trial of MET and have refilled the tamsulosin and hydrocodone. She will return in 2 weeks for a KUB but [facility] symptoms wor[facility]. I briefly discussed ESWL and URS - 03/03/2021    NON-GU PMH: Bacteriuria, I will get a culture for completeness. - 03/03/2021 Asthma Depression    FAMILY HISTORY: 2 daughters - Other Blood In Urine - Mother kidney - Mother Kidney Failure - Aunt, Other Kidney Stones - Runs in Family Prostate Cancer - Runs in Family   SOCIAL HISTORY: Marital Status:  Married Preferred Language: English; Ethnicity: Not Hispanic Or Latino; Race: White Does not use smokeless tobacco. Does not use drugs. Drinks 4+ caffeinated drinks per day. Patient's occupation [employer]    REVIEW OF SYSTEMS:    GU Review Female:   Patient reports frequent urination, get up at night to urinate, leakage of urine, trouble starting your stream, and have to strain to urinate. Patient denies hard to postpone urination, burning /pain with urination, stream starts and stops, and being pregnant.  Gastrointestinal (Upper):   Patient reports nausea and vomiting. Patient denies indigestion/ heartburn.  Gastrointestinal (Lower):   Patient denies constipation and diarrhea.  Constitutional:   Patient denies fever, night sweats, weight loss, and fatigue.  Skin:   Patient denies skin rash/ lesion and itching.  Eyes:   Patient denies blurred vision and double vision.  Ears/ Nose/ Throat:   Patient denies sore throat and sinus problems.  Hematologic/Lymphatic:   Patient denies swollen glands and easy bruising.  Cardiovascular:   Patient denies leg swelling and chest pains.  Respiratory:   Patient denies cough and shortness of breath.  Endocrine:   Patient denies excessive thirst.  Musculoskeletal:   Patient reports back pain. Patient denies joint pain.  Neurological:   Patient denies headaches and dizziness.  Psychologic:   Patient denies depression  and anxiety.   VITAL SIGNS:      03/17/2021 03:20 PM  Weight 240 lb / 108.86 kg  Height 66 in / 167.64 cm  BP 116/82 mmHg  Pulse 81 /min  Temperature 97.8 F / 36.5 C  BMI 38.7 kg/m   GU PHYSICAL EXAMINATION:      Notes: Right flank tenderness   MULTI-SYSTEM PHYSICAL EXAMINATION:    Constitutional: Obese. No physical deformities. Normally developed. Good grooming.   Respiratory: No labored breathing, no use of accessory muscles.   Cardiovascular: Normal temperature, normal extremity pulses, no swelling, no varicosities.   Skin: No paleness, no jaundice, no cyanosis. No lesion, no ulcer, no rash.  Neurologic / Psychiatric: Oriented to time, oriented to place, oriented to person. No depression, no anxiety, no agitation.  Gastrointestinal: No mass, no tenderness, no rigidity, non obese abdomen.     Complexity of Data:  Source Of History:  Patient  Records Review:   Previous Doctor Records, Previous Patient Records  Urine Test Review:   Urinalysis   03/17/21 03/17/21  Urinalysis  Urine Appearance Slightly Cloudy  Slightly Cloudy   Urine Color Yellow  Yellow   Urine Glucose Neg mg/dL Neg   Urine Bilirubin Neg mg/dL Neg   Urine Ketones Neg mg/dL Neg   Urine Specific Gravity >1.030  >1.030   Urine Blood Neg ery/uL Neg   Urine pH 6.0  6.0   Urine Protein Trace mg/dL Trace   Urine Urobilinogen 1.0 mg/dL 1.0   Urine Nitrites Neg  Neg   Urine Leukocyte Esterase Neg leu/uL Neg   Urine WBC/hpf 6 - 10/hpf  6 - 10/hpf   Urine RBC/hpf 0 - 2/hpf  0 - 2/hpf   Urine Epithelial Cells 0 - 5/hpf  0 - 5/hpf   Urine Bacteria Many (>50/hpf)  Many (>50/hpf)   Urine Mucous Present  Present   Urine Yeast NS (Not Seen)  NS (Not Seen)   Urine Trichomonas Not Present  Not Present   Urine Cystals NS (Not Seen)  NS (Not Seen)   Urine Casts NS (Not Seen)  NS (Not Seen)   Urine Sperm Not Present  Not Present    PROCEDURES:          Urinalysis w/Scope - 81001 Dipstick Dipstick Cont'd Micro  Color: Yellow Bilirubin: Neg WBC/hpf: 6 - 10/hpf  Appearance: Slightly Cloudy Ketones: Neg RBC/hpf: 0 - 2/hpf  Specific Gravity: >1.030 Blood: Neg Bacteria: Many (>50/hpf)  pH: 6.0 Protein: Trace Cystals: NS (Not Seen)  Glucose: Neg Urobilinogen: 1.0 Casts: NS (Not Seen)    Nitrites: Neg Trichomonas: Not Present    Leukocyte Esterase: Neg Mucous: Present      Epithelial Cells: 0 - 5/hpf      Yeast: NS (Not Seen)      Sperm: Not Present    Notes:      ASSESSMENT:      ICD-10 Details  1 GU:   Ureteral calculus - N20.1 Right,  Acute, Uncomplicated  2   Renal calculus - N20.0 Right, Chronic, Stable   PLAN:            Medications New Meds: Bactrim Ds 800 mg-160 mg tablet 1 tablet PO BID   #14  0 Refill(s)    Refill Meds: Hydrocodone-Acetaminophen 5 mg-325 mg tablet 1 tablet PO Q 6 H PRN   #15  0 Refill(s)            Orders Labs CULTURE, URINE  Document Letter(s):  Created for Patient: Clinical Summary         Notes:   Urinalysis sent for culture today. She has not passed her stone and continues to be symptomatic. Urinalysis will be sent for precautionary culture today. Stone intervention was discussed in detail today. For ureteroscopy, the patient understands that there is a chance for a staged procedure. Patient also understands that there is risk for bleeding, infection, injury to surrounding organs, and general risks of anesthesia. The patient also understands the placement of a stent and the risks of stent placement including, risk for infection, the risk for pain, and the risk for injury. For ESWL, the patient understands that there is a chance of failure of procedure, there is also a risk for bruising, infection, bleeding, and injury to surrounding structures. The patient verbalized understanding to these risks.   She would like to proceed with ureteroscopy for goal of stone free. Green sheet was placed today. I also advised Bactrim DS due to urinalysis. Prescription for pain medication was sent to her pharmacy. She was given a strainer today to continue to strain her urine. She was given strict return precautions and she verbalized her understanding.

## 2021-04-16 ENCOUNTER — Encounter (HOSPITAL_BASED_OUTPATIENT_CLINIC_OR_DEPARTMENT_OTHER): Payer: Self-pay | Admitting: Urology

## 2021-04-16 ENCOUNTER — Ambulatory Visit (HOSPITAL_BASED_OUTPATIENT_CLINIC_OR_DEPARTMENT_OTHER): Payer: BC Managed Care – PPO | Admitting: Anesthesiology

## 2021-04-16 ENCOUNTER — Encounter (HOSPITAL_BASED_OUTPATIENT_CLINIC_OR_DEPARTMENT_OTHER)
Admission: RE | Disposition: A | Discharge status: Home / Self Care | Payer: Self-pay | Source: Home / Self Care | Attending: Urology

## 2021-04-16 ENCOUNTER — Ambulatory Visit (HOSPITAL_BASED_OUTPATIENT_CLINIC_OR_DEPARTMENT_OTHER)
Admission: RE | Admit: 2021-04-16 | Discharge: 2021-04-16 | Disposition: A | Discharge status: Home / Self Care | Payer: BC Managed Care – PPO | Attending: Urology | Admitting: Urology

## 2021-04-16 DIAGNOSIS — N202 Calculus of kidney with calculus of ureter: Secondary | ICD-10-CM | POA: Diagnosis not present

## 2021-04-16 DIAGNOSIS — I10 Essential (primary) hypertension: Secondary | ICD-10-CM | POA: Diagnosis not present

## 2021-04-16 HISTORY — PX: KIDNEY STONE SURGERY: SHX686

## 2021-04-16 HISTORY — DX: Borderline personality disorder: F60.3

## 2021-04-16 HISTORY — PX: CYSTOSCOPY/URETEROSCOPY/HOLMIUM LASER/STENT PLACEMENT: SHX6546

## 2021-04-16 HISTORY — PX: HOLMIUM LASER APPLICATION: SHX5852

## 2021-04-16 LAB — POCT I-STAT, CHEM 8
BUN: 8 mg/dL (ref 6–20)
Calcium, Ion: 1.28 mmol/L (ref 1.15–1.40)
Chloride: 106 mmol/L (ref 98–111)
Creatinine, Ser: 0.7 mg/dL (ref 0.44–1.00)
Glucose, Bld: 88 mg/dL (ref 70–99)
HCT: 38 % (ref 36.0–46.0)
Hemoglobin: 12.9 g/dL (ref 12.0–15.0)
Potassium: 4.2 mmol/L (ref 3.5–5.1)
Sodium: 138 mmol/L (ref 135–145)
TCO2: 23 mmol/L (ref 22–32)

## 2021-04-16 LAB — POCT PREGNANCY, URINE: Preg Test, Ur: NEGATIVE

## 2021-04-16 SURGERY — CYSTOSCOPY/URETEROSCOPY/HOLMIUM LASER/STENT PLACEMENT
Anesthesia: General | Site: Renal | Laterality: Right

## 2021-04-16 MED ORDER — ONDANSETRON HCL 4 MG/2ML IJ SOLN
INTRAMUSCULAR | Status: DC | PRN
  Administered 2021-04-16: 4 mg via INTRAVENOUS

## 2021-04-16 MED ORDER — FENTANYL CITRATE (PF) 100 MCG/2ML IJ SOLN
INTRAMUSCULAR | Status: AC
  Filled 2021-04-16: qty 2

## 2021-04-16 MED ORDER — LIDOCAINE 2% (20 MG/ML) 5 ML SYRINGE
INTRAMUSCULAR | Status: DC | PRN
  Administered 2021-04-16: 100 mg via INTRAVENOUS

## 2021-04-16 MED ORDER — FENTANYL CITRATE (PF) 100 MCG/2ML IJ SOLN
25.0000 ug | INTRAMUSCULAR | Status: DC | PRN
  Administered 2021-04-16 (×2): 25 ug via INTRAVENOUS

## 2021-04-16 MED ORDER — LIDOCAINE 2% (20 MG/ML) 5 ML SYRINGE
INTRAMUSCULAR | Status: AC
  Filled 2021-04-16: qty 5

## 2021-04-16 MED ORDER — PROPOFOL 10 MG/ML IV BOLUS
INTRAVENOUS | Status: DC | PRN
  Administered 2021-04-16: 200 mg via INTRAVENOUS

## 2021-04-16 MED ORDER — DEXAMETHASONE SODIUM PHOSPHATE 10 MG/ML IJ SOLN
INTRAMUSCULAR | Status: DC | PRN
  Administered 2021-04-16: 10 mg via INTRAVENOUS

## 2021-04-16 MED ORDER — ACETAMINOPHEN 500 MG PO TABS
1000.0000 mg | ORAL_TABLET | Freq: Once | ORAL | Status: AC
  Administered 2021-04-16: 1000 mg via ORAL

## 2021-04-16 MED ORDER — OXYCODONE HCL 5 MG PO TABS
ORAL_TABLET | ORAL | Status: AC
  Filled 2021-04-16: qty 1

## 2021-04-16 MED ORDER — CIPROFLOXACIN IN D5W 400 MG/200ML IV SOLN
400.0000 mg | Freq: Two times a day (BID) | INTRAVENOUS | Status: DC
  Administered 2021-04-16: 400 mg via INTRAVENOUS

## 2021-04-16 MED ORDER — MIDAZOLAM HCL 5 MG/5ML IJ SOLN
INTRAMUSCULAR | Status: DC | PRN
Start: 2021-04-16 — End: 2021-04-16
  Administered 2021-04-16: 2 mg via INTRAVENOUS

## 2021-04-16 MED ORDER — SCOPOLAMINE 1 MG/3DAYS TD PT72
MEDICATED_PATCH | TRANSDERMAL | Status: AC
  Filled 2021-04-16: qty 1

## 2021-04-16 MED ORDER — CIPROFLOXACIN IN D5W 400 MG/200ML IV SOLN
INTRAVENOUS | Status: AC
  Filled 2021-04-16: qty 200

## 2021-04-16 MED ORDER — ACETAMINOPHEN 500 MG PO TABS
ORAL_TABLET | ORAL | Status: AC
  Filled 2021-04-16: qty 2

## 2021-04-16 MED ORDER — HYDROCODONE-ACETAMINOPHEN 5-325 MG PO TABS: 1.0000 | ORAL_TABLET | Freq: Four times a day (QID) | ORAL | 0 refills | Status: DC | PRN

## 2021-04-16 MED ORDER — FENTANYL CITRATE (PF) 100 MCG/2ML IJ SOLN
INTRAMUSCULAR | Status: DC | PRN
  Administered 2021-04-16 (×2): 50 ug via INTRAVENOUS

## 2021-04-16 MED ORDER — IOHEXOL 300 MG/ML  SOLN
INTRAMUSCULAR | Status: DC | PRN
  Administered 2021-04-16: 2 mL

## 2021-04-16 MED ORDER — DEXAMETHASONE SODIUM PHOSPHATE 10 MG/ML IJ SOLN
INTRAMUSCULAR | Status: AC
  Filled 2021-04-16: qty 1

## 2021-04-16 MED ORDER — KETOROLAC TROMETHAMINE 30 MG/ML IJ SOLN
INTRAMUSCULAR | Status: DC | PRN
Start: 2021-04-16 — End: 2021-04-16
  Administered 2021-04-16: 30 mg via INTRAVENOUS

## 2021-04-16 MED ORDER — 0.9 % SODIUM CHLORIDE (POUR BTL) OPTIME
TOPICAL | Status: DC | PRN
  Administered 2021-04-16: 500 mL

## 2021-04-16 MED ORDER — ONDANSETRON HCL 4 MG/2ML IJ SOLN
INTRAMUSCULAR | Status: AC
  Filled 2021-04-16: qty 2

## 2021-04-16 MED ORDER — OXYCODONE HCL 5 MG PO TABS
5.0000 mg | ORAL_TABLET | Freq: Once | ORAL | Status: AC | PRN
  Administered 2021-04-16: 5 mg via ORAL

## 2021-04-16 MED ORDER — SODIUM CHLORIDE 0.9 % IR SOLN
Status: DC | PRN
  Administered 2021-04-16: 3000 mL

## 2021-04-16 MED ORDER — KETOROLAC TROMETHAMINE 30 MG/ML IJ SOLN
INTRAMUSCULAR | Status: AC
  Filled 2021-04-16: qty 1

## 2021-04-16 MED ORDER — MIDAZOLAM HCL 2 MG/2ML IJ SOLN
INTRAMUSCULAR | Status: AC
  Filled 2021-04-16: qty 2

## 2021-04-16 MED ORDER — SODIUM CHLORIDE 0.9% FLUSH: 3.0000 mL | Freq: Two times a day (BID) | INTRAVENOUS | Status: DC

## 2021-04-16 MED ORDER — PHENAZOPYRIDINE HCL 200 MG PO TABS: 200.0000 mg | ORAL_TABLET | Freq: Three times a day (TID) | ORAL | 1 refills | Status: DC | PRN

## 2021-04-16 MED ORDER — SCOPOLAMINE 1 MG/3DAYS TD PT72
1.0000 | MEDICATED_PATCH | Freq: Once | TRANSDERMAL | Status: DC
  Administered 2021-04-16: 1.5 mg via TRANSDERMAL

## 2021-04-16 MED ORDER — OXYCODONE HCL 5 MG/5ML PO SOLN: 5.0000 mg | Freq: Once | ORAL | Status: AC | PRN

## 2021-04-16 MED ORDER — PROMETHAZINE HCL 25 MG/ML IJ SOLN: 6.2500 mg | INTRAMUSCULAR | Status: DC | PRN

## 2021-04-16 MED ORDER — PROPOFOL 10 MG/ML IV BOLUS
INTRAVENOUS | Status: AC
  Filled 2021-04-16: qty 20

## 2021-04-16 MED ORDER — LACTATED RINGERS IV SOLN: INTRAVENOUS | Status: DC

## 2021-04-16 SURGICAL SUPPLY — 32 items
BAG DRAIN URO-CYSTO SKYTR STRL (DRAIN) ×3 IMPLANT
BAG DRN UROCATH (DRAIN) ×1
BASKET STONE 1.7 NGAGE (UROLOGICAL SUPPLIES) ×1 IMPLANT
BASKET ZERO TIP NITINOL 2.4FR (BASKET) IMPLANT
BSKT STON RTRVL ZERO TP 2.4FR (BASKET)
CATH URET 5FR 28IN CONE TIP (BALLOONS)
CATH URET 5FR 28IN OPEN ENDED (CATHETERS) IMPLANT
CATH URET 5FR 70CM CONE TIP (BALLOONS) IMPLANT
CLOTH BEACON ORANGE TIMEOUT ST (SAFETY) ×3 IMPLANT
COVER DOME SNAP 22 D (MISCELLANEOUS) ×1 IMPLANT
ELECT REM PT RETURN 9FT ADLT (ELECTROSURGICAL)
ELECTRODE REM PT RTRN 9FT ADLT (ELECTROSURGICAL) IMPLANT
FIBER LASER FLEXIVA 365 (UROLOGICAL SUPPLIES) IMPLANT
GLOVE SURG POLYISO LF SZ8 (GLOVE) ×3 IMPLANT
GLOVE SURG UNDER POLY LF SZ7 (GLOVE) ×2 IMPLANT
GOWN STRL REUS W/ TWL LRG LVL3 (GOWN DISPOSABLE) IMPLANT
GOWN STRL REUS W/TWL LRG LVL3 (GOWN DISPOSABLE) ×2
GOWN STRL REUS W/TWL XL LVL3 (GOWN DISPOSABLE) ×3 IMPLANT
GUIDEWIRE ANG ZIPWIRE 038X150 (WIRE) IMPLANT
GUIDEWIRE STR DUAL SENSOR (WIRE) ×3 IMPLANT
INFUSOR MANOMETER BAG 3000ML (MISCELLANEOUS) IMPLANT
IV NS IRRIG 3000ML ARTHROMATIC (IV SOLUTION) ×3 IMPLANT
KIT TURNOVER CYSTO (KITS) ×3 IMPLANT
MANIFOLD NEPTUNE II (INSTRUMENTS) ×3 IMPLANT
NS IRRIG 500ML POUR BTL (IV SOLUTION) ×3 IMPLANT
PACK CYSTO (CUSTOM PROCEDURE TRAY) ×3 IMPLANT
SHEATH URETERAL 12FRX28CM (UROLOGICAL SUPPLIES) ×1 IMPLANT
STENT URET 6FRX24 CONTOUR (STENTS) ×1 IMPLANT
TRACTIP FLEXIVA PULS ID 200XHI (Laser) IMPLANT
TRACTIP FLEXIVA PULSE ID 200 (Laser) ×2
TUBE CONNECTING 12X1/4 (SUCTIONS) ×3 IMPLANT
TUBING UROLOGY SET (TUBING) IMPLANT

## 2021-04-16 NOTE — Transfer of Care (Signed)
Immediate Anesthesia Transfer of Care Note  Patient: [name]  Procedure(s) Performed: CYSTOSCOPY RIGHT RETROGRADE PYELOGRAM  URETEROSCOPY/HOLMIUM LASER/STENT PLACEMENT (Right: Renal) HOLMIUM LASER APPLICATION (Right: Renal)  Patient Location: PACU  Anesthesia Type:General  Level of Consciousness: awake, alert  and oriented  Airway & Oxygen Therapy: Patient Spontanous Breathing and Patient connected to nasal cannula oxygen  Post-op Assessment: Report given to RN and Post -op Vital signs reviewed and stable  Post vital signs: Reviewed and stable  Last Vitals:  Vitals Value Taken Time  BP    Temp    Pulse 77 04/16/21 1311  Resp 16 04/16/21 1311  SpO2 99 % 04/16/21 1311  Vitals shown include unvalidated device data.  Last Pain:  Vitals:   04/16/21 1024  TempSrc: Oral  PainSc: 4       Patients Stated Pain Goal: 4 ([id number])  Complications: No notable events documented.

## 2021-04-16 NOTE — Op Note (Signed)
Procedure: 1.  Cystoscopy with right retrograde pyelogram and interpretation. 2.  Right ureteroscopy with holmium laser application, stone extraction and insertion of right double-J stent. 3.  Application of fluoroscopy.  Preop diagnosis: 6 mm right distal ureteral stone with obstruction and 2 mm right renal stone.  Postop diagnosis: Same.  Surgeon: Dr. Bjorn Pippin.  Anesthesia: General.  Specimen: Stone fragments.  Drain: 6 [place] by 24 cm right contour double-J stent without tether.  EBL: None.  Complications: None.  Indications: The patient is a 36 year old female with a history recurrent urolithiasis who was originally found to have a 6 mm right distal ureteral stone in November and has not passed that stone so she is to undergo ureteroscopy for management.  She also has a 2 mm right renal stone that we will treat at the time of the distal stone extraction.  Procedure: She was given Cipro.  She was taken operating room where general anesthetic was induced.  She was placed in lithotomy position and fitted with PAS hose.  Her perineum and genitalia were prepped with Betadine solution she was draped in usual sterile fashion.  Cystoscopy was performed using the 21 [place] scope and 30 degree lens.  Examination revealed a normal urethra.  The bladder wall was smooth and pale without tumors, stones or inflammation.  The ureteral orifices were unremarkable.  On initial fluoroscopy the stone was not clearly visible.  The right ureteral orifice was cannulated with a 5 French opening catheter and contrast was instilled.  The right retrograde pyelogram demonstrated a normal caliber distal ureter with a filling defect about 3 to 4 cm proximal to the meatus and dilation of the ureter above that.  A sensor wire was then advanced through the open-ended catheter by the stone to the kidney without difficulty and the inner core of a 12/14 French 25 cm digital access sheath was advanced over the wire by  the stone into the mid ureter without difficulty.  The 6.5 French dual-lumen semirigid ureteroscope was then advanced alongside the wire.  There was some inflammatory changes distal to the stone which was identified just above this area of inflammation in the dilated portion of the ureter.  The stone was then fragmented using the 200 micron laser fiber set on 0.5 J and 20 hz for the left paddle and 1 J and 20 Hz on the right pedal.  The right pedal was primarily used.  Once the stone was adequately fragmented, the fragments were removed using the engage basket.  The semirigid scope was removed and the assembled digital access sheath was then advanced over the wire into the proximal ureter and the inner core and wire were then removed.  The single-lumen digital flexible scope was then passed through the sheath and then using the tip of the sensor wire as a filiform through the scope negotiated into the renal collecting system.  The single small stone was identified in the midpole calyx and removed with the engage basket.  Thorough inspection of the remaining calyces demonstrated no additional stone material.  Ureteroscope was removed and a sensor wire was reinserted through the access sheath to the kidney.  The access sheath was removed and the cystoscope was reinserted over the wire.  A 6 French by 24 cm contour double-J stent without tether was then passed over the wire to the kidney under fluoroscopic guidance.  The wire was removed, leaving a good coil in the kidney and a good coil in the bladder.  I did  not leave a tether because of the degree of inflammatory change distal to the stone and I felt at least a week of stent retention was indicated.  The bladder was then drained and the cystoscope was removed.  She was taken down from lithotomy position, her anesthetic was reversed and she was moved recovery in stable condition.  There were no complications.  She will be sent home with her stone fragments to  bring to her follow-up visit.

## 2021-04-16 NOTE — Discharge Instructions (Addendum)
No acetaminophen/Tylenol until after 4:30pm today if needed for pain.   No ibuprofen, Advil, Aleve, Motrin, ketorolac, meloxicam, naproxen, or other NSAIDS until after 7:00pm today if needed for pain.    Please bring the stone fragments to the office at your follow up visit.  I will have the office contact you to arrange a follow up vist in 7-14 days so that we can do cystoscopy in the office to remove the stent.  I didn't leave an exterior string for the stent because I wanted to make sure it stays for at least a week to allow the inflammation from the stone to resolve.    Post Anesthesia Home Care Instructions  Activity: Get plenty of rest for the remainder of the day. A responsible individual must stay with you for 24 hours following the procedure.  For the next 24 hours, DO NOT: -Drive a car -Advertising copywriter -Drink alcoholic beverages -Take any medication unless instructed by your physician -Make any legal decisions or sign important papers.  Meals: Start with liquid foods such as gelatin or soup. Progress to regular foods as tolerated. Avoid greasy, spicy, heavy foods. If nausea and/or vomiting occur, drink only clear liquids until the nausea and/or vomiting subsides. Call your physician if vomiting continues.  Special Instructions/Symptoms: Your throat may feel dry or sore from the anesthesia or the breathing tube placed in your throat during surgery. If this causes discomfort, gargle with warm salt water. The discomfort should disappear within 24 hours.  If you had a scopolamine patch placed behind your ear for the management of post- operative nausea and/or vomiting:  1. The medication in the patch is effective for 72 hours, after which it should be removed.  Wrap patch in a tissue and discard in the trash. Wash hands thoroughly with soap and water. 2. You may remove the patch earlier than 72 hours if you experience unpleasant side effects which may include dry mouth,  dizziness or visual disturbances. 3. Avoid touching the patch. Wash your hands with soap and water after contact with the patch.

## 2021-04-16 NOTE — Anesthesia Procedure Notes (Signed)
Procedure Name: LMA Insertion Date/Time: 04/16/2021 12:29 PM Performed by: Cleda Clarks, CRNA Pre-anesthesia Checklist: Patient identified, Emergency Drugs available, Suction available and Patient being monitored Patient Re-evaluated:Patient Re-evaluated prior to induction Oxygen Delivery Method: Circle system utilized Preoxygenation: Pre-oxygenation with 100% oxygen Induction Type: IV induction Ventilation: Mask ventilation without difficulty LMA: LMA inserted LMA Size: 4.0 Number of attempts: 1 Airway Equipment and Method: Bite block Placement Confirmation: positive ETCO2 Tube secured with: Tape Dental Injury: Teeth and Oropharynx as per pre-operative assessment

## 2021-04-16 NOTE — Interval H&P Note (Signed)
History and Physical Interval Note:  04/16/2021 12:15 PM  [name]  has presented today for surgery, with the diagnosis of RIGHT URETERAL AND RENAL STONE.  The various methods of treatment have been discussed with the patient and family. After consideration of risks, benefits and other options for treatment, the patient has consented to  Procedure(s): CYSTOSCOPY RIGHT RETROGRADE PYELOGRAM  URETEROSCOPY/HOLMIUM LASER/STENT PLACEMENT (Right) HOLMIUM LASER APPLICATION (Right) as a surgical intervention.  The patient's history has been reviewed, patient examined, no change in status, stable for surgery.  I have reviewed the patient's chart and labs.  Questions were answered to the patient's satisfaction.     Irine Seal

## 2021-04-19 ENCOUNTER — Encounter (HOSPITAL_BASED_OUTPATIENT_CLINIC_OR_DEPARTMENT_OTHER): Payer: Self-pay | Admitting: Urology

## 2021-04-19 NOTE — Anesthesia Postprocedure Evaluation (Signed)
Anesthesia Post Note  Patient: [name]  Procedure(s) Performed: CYSTOSCOPY RIGHT RETROGRADE PYELOGRAM  URETEROSCOPY/HOLMIUM LASER/STENT PLACEMENT (Right: Renal) HOLMIUM LASER APPLICATION (Right: Renal)     Patient location during evaluation: PACU Anesthesia Type: General Level of consciousness: awake and alert and oriented Pain management: pain level controlled Vital Signs Assessment: post-procedure vital signs reviewed and stable Respiratory status: spontaneous breathing, nonlabored ventilation and respiratory function stable Cardiovascular status: blood pressure returned to baseline Postop Assessment: no apparent nausea or vomiting Anesthetic complications: no   No notable events documented.  Last Vitals:  Vitals:   04/16/21 1419 04/16/21 1443  BP: (!) 126/93 (!) 121/91  Pulse: 75 82  Resp: 16 16  Temp:  36.8 C  SpO2: 100% 98%    Last Pain:  Vitals:   04/16/21 1443  TempSrc:   PainSc: Alpha

## 2021-04-23 DIAGNOSIS — B954 Other streptococcus as the cause of diseases classified elsewhere: Secondary | ICD-10-CM | POA: Diagnosis not present

## 2021-04-23 DIAGNOSIS — N202 Calculus of kidney with calculus of ureter: Secondary | ICD-10-CM | POA: Diagnosis not present

## 2021-04-23 DIAGNOSIS — N390 Urinary tract infection, site not specified: Secondary | ICD-10-CM | POA: Diagnosis not present

## 2021-04-27 DIAGNOSIS — N201 Calculus of ureter: Secondary | ICD-10-CM | POA: Diagnosis not present

## 2021-04-27 DIAGNOSIS — N3000 Acute cystitis without hematuria: Secondary | ICD-10-CM | POA: Diagnosis not present

## 2021-05-17 DIAGNOSIS — J029 Acute pharyngitis, unspecified: Secondary | ICD-10-CM | POA: Diagnosis not present

## 2021-05-17 DIAGNOSIS — J329 Chronic sinusitis, unspecified: Secondary | ICD-10-CM | POA: Diagnosis not present

## 2021-05-17 DIAGNOSIS — H60393 Other infective otitis externa, bilateral: Secondary | ICD-10-CM | POA: Diagnosis not present

## 2021-05-17 DIAGNOSIS — R059 Cough, unspecified: Secondary | ICD-10-CM | POA: Diagnosis not present

## 2021-05-25 ENCOUNTER — Ambulatory Visit: Payer: Self-pay | Admitting: Family Medicine

## 2021-05-28 ENCOUNTER — Ambulatory Visit: Payer: BC Managed Care – PPO | Admitting: Family Medicine

## 2021-05-28 ENCOUNTER — Encounter: Payer: Self-pay | Admitting: Family Medicine

## 2021-05-28 VITALS — BP 126/87 | HR 96 | Temp 98.8°F | Ht 66.0 in | Wt 254.4 lb

## 2021-05-28 DIAGNOSIS — F603 Borderline personality disorder: Secondary | ICD-10-CM | POA: Diagnosis not present

## 2021-05-28 DIAGNOSIS — N202 Calculus of kidney with calculus of ureter: Secondary | ICD-10-CM | POA: Diagnosis not present

## 2021-05-28 DIAGNOSIS — S90212A Contusion of left great toe with damage to nail, initial encounter: Secondary | ICD-10-CM | POA: Diagnosis not present

## 2021-05-28 NOTE — Progress Notes (Signed)
New Patient Office Visit  Assessment & Plan:  1. Borderline personality disorder [facility]) Discussed with patient that I do not manage borderline personality disorder. Referral placed. - Ambulatory referral to Psychiatry  2. Subungual hematoma of great toe of left foot, initial encounter Growing out normally. Discussed the end of the toenail that is thickened and discolored may fall off, but there is nothing to do for it.    Follow-up: Return in about 4 months (around 09/25/2021) for annual physical w. pap.   Deliah Boston, MSN, APRN, FNP-C [facility]  Subjective:  Patient ID: [name], female    DOB: [birth year]  Age: 36 y.o. MRN: [id number]  Patient Care Team: Gwenlyn Fudge, FNP as PCP - General (Family Medicine)  CC:  Chief Complaint  Patient presents with   Establish Care    Don't know what going on with her toe nails, discolored, pt stated that it feels like its going to fall off. Also want to started medication for boarder line personality disorder/depression.    HPI [name] presents to establish care. She has not seen a primary care doctor in 10+ years.  She is accompanied by her boyfriend who she is okay with being present.   Patient was diagnosed with borderline personality disorder by the psychiatrist in [place], [place]. She states they originally thought it was bipolar disorder, but they have changed her diagnosis. She last saw the psychiatrist in 2019 at which time she felt like she was doing okay. She reports a lot of stress at work and feels she needs to go back on medication. She was previously taking something that started with a L and felt she was doing well. She has tried and failed numerous medications in the past.   Review of Systems  Constitutional:  Negative for chills, fever, malaise/fatigue and weight loss.  HENT:  Negative for congestion, ear discharge, ear pain, nosebleeds, sinus pain, sore throat and tinnitus.   Eyes:   Negative for blurred vision, double vision, pain, discharge and redness.  Respiratory:  Negative for cough, shortness of breath and wheezing.   Cardiovascular:  Negative for chest pain, palpitations and leg swelling.  Gastrointestinal:  Negative for abdominal pain, constipation, diarrhea, heartburn, nausea and vomiting.  Genitourinary:  Negative for dysuria, frequency and urgency.  Musculoskeletal:  Negative for myalgias.  Skin:  Negative for rash.  Neurological:  Negative for dizziness, seizures, weakness and headaches.  Psychiatric/Behavioral:  Negative for depression, substance abuse and suicidal ideas. The patient is not nervous/anxious.    No current outpatient medications on file.  Allergies  Allergen Reactions   Sodium Hypochlorite Shortness Of Breath   Keflex [Cephalexin]    Latex    Skin Bleaching [Hydroquinone]     Facial swelling, SOB    Past Medical History:  Diagnosis Date   Bipolar disorder (HCC)    Borderline personality disorder (HCC)    Hypertension in pregnancy    Knee pain    Nephrolithiasis    Postpartum depression    Preeclampsia     Past Surgical History:  Procedure Laterality Date   APPENDECTOMY     CESAREAN SECTION     CYSTOSCOPY/URETEROSCOPY/HOLMIUM LASER/STENT PLACEMENT Right 04/16/2021   Procedure: CYSTOSCOPY RIGHT RETROGRADE PYELOGRAM  URETEROSCOPY/HOLMIUM LASER/STENT PLACEMENT;  Surgeon: Bjorn Pippin, MD;  Location: [facility];  Service: Urology;  Laterality: Right;   HOLMIUM LASER APPLICATION Right 04/16/2021   Procedure: HOLMIUM LASER APPLICATION;  Surgeon: Bjorn Pippin, MD;  Location: [facility];  Service: Urology;  Laterality: Right;   KIDNEY STONE SURGERY  04/16/2021   r-kidney   KNEE SURGERY Right    scoped x2   WISDOM TOOTH EXTRACTION  2010    Family History  Problem Relation Age of Onset   Hypertension Mother    COPD Mother    Healthy Father    Stroke Maternal Grandmother    Diabetes Maternal  Grandfather    Hypertension Maternal Grandfather     Social History   Socioeconomic History   Marital status: Divorced    Spouse name: Not on file   Number of children: Not on file   Years of education: Not on file   Highest education level: Not on file  Occupational History   Not on file  Tobacco Use   Smoking status: Never   Smokeless tobacco: Never  Vaping Use   Vaping Use: Never used  Substance and Sexual Activity   Alcohol use: No   Drug use: Not Currently    Types: Marijuana    Comment: last used 02/20/13   Sexual activity: Yes    Birth control/protection: None  Other Topics Concern   Not on file  Social History Narrative   Not on file   Social Determinants of Health   Financial Resource Strain: Not on file  Food Insecurity: Not on file  Transportation Needs: Not on file  Physical Activity: Not on file  Stress: Not on file  Social Connections: Not on file  Intimate Partner Violence: Not on file    Objective:   Today's Vitals: BP 126/87    Pulse 96    Temp 98.8 F (37.1 C) (Temporal)    Ht 5\' 6"  (1.676 m)    Wt 254 lb 6.4 oz (115.4 kg)    SpO2 95%    BMI 41.06 kg/m   Physical Exam Vitals reviewed.  Constitutional:      General: She is not in acute distress.    Appearance: Normal appearance. She is morbidly obese. She is not ill-appearing, toxic-appearing or diaphoretic.  HENT:     Head: Normocephalic and atraumatic.  Eyes:     General: No scleral icterus.       Right eye: No discharge.        Left eye: No discharge.     Conjunctiva/sclera: Conjunctivae normal.  Cardiovascular:     Rate and Rhythm: Normal rate and regular rhythm.     Heart sounds: Normal heart sounds. No murmur heard.   No friction rub. No gallop.  Pulmonary:     Effort: Pulmonary effort is normal. No respiratory distress.     Breath sounds: Normal breath sounds. No stridor. No wheezing, rhonchi or rales.  Musculoskeletal:        General: Normal range of motion.     Cervical  back: Normal range of motion.  Feet:     Comments: Distal portion of left great toenail thick and discolored due to subungual hematoma. Skin:    General: Skin is warm and dry.     Capillary Refill: Capillary refill takes less than 2 seconds.  Neurological:     General: No focal deficit present.     Mental Status: She is alert and oriented to person, place, and time. Mental status is at baseline.  Psychiatric:        Mood and Affect: Mood normal.        Behavior: Behavior normal.        Thought Content: Thought content  normal.        Judgment: Judgment normal.

## 2021-06-02 DIAGNOSIS — F319 Bipolar disorder, unspecified: Secondary | ICD-10-CM | POA: Diagnosis not present

## 2021-06-08 DIAGNOSIS — F411 Generalized anxiety disorder: Secondary | ICD-10-CM | POA: Diagnosis not present

## 2021-06-08 DIAGNOSIS — F319 Bipolar disorder, unspecified: Secondary | ICD-10-CM | POA: Diagnosis not present

## 2021-06-08 DIAGNOSIS — G479 Sleep disorder, unspecified: Secondary | ICD-10-CM | POA: Diagnosis not present

## 2021-06-08 DIAGNOSIS — G478 Other sleep disorders: Secondary | ICD-10-CM | POA: Diagnosis not present

## 2021-08-23 ENCOUNTER — Ambulatory Visit
Admission: RE | Admit: 2021-08-23 | Discharge: 2021-08-23 | Disposition: A | Discharge status: Home / Self Care | Payer: BC Managed Care – PPO | Source: Ambulatory Visit | Attending: Internal Medicine | Admitting: Internal Medicine

## 2021-08-23 VITALS — BP 131/92 | HR 89 | Temp 99.0°F | Resp 18

## 2021-08-23 DIAGNOSIS — Z20818 Contact with and (suspected) exposure to other bacterial communicable diseases: Secondary | ICD-10-CM | POA: Insufficient documentation

## 2021-08-23 DIAGNOSIS — M25561 Pain in right knee: Secondary | ICD-10-CM | POA: Insufficient documentation

## 2021-08-23 DIAGNOSIS — J029 Acute pharyngitis, unspecified: Secondary | ICD-10-CM | POA: Insufficient documentation

## 2021-08-23 LAB — POCT RAPID STREP A (OFFICE): Rapid Strep A Screen: NEGATIVE

## 2021-08-23 MED ORDER — LIDOCAINE VISCOUS HCL 2 % MT SOLN: 10.0000 mL | OROMUCOSAL | 0 refills | Status: DC | PRN

## 2021-08-23 MED ORDER — AZITHROMYCIN 250 MG PO TABS: ORAL_TABLET | ORAL | 0 refills | Status: DC

## 2021-08-23 MED ORDER — PREDNISONE 20 MG PO TABS: 40.0000 mg | ORAL_TABLET | Freq: Every day | ORAL | 0 refills | Status: DC

## 2021-08-23 NOTE — ED Triage Notes (Signed)
Pt states that her right knee started bothering her for the past 3 days with swelling ? ?Pt states she is pretty sure she contracted Strep throat yesterday because her throat started hurting as well ? ?Pt states she has ran a fever for the past 2 days ? ?Pt states she has tried OTC cold and Flu and Ibuprofen  ?

## 2021-08-23 NOTE — ED Provider Notes (Signed)
?[facility] ? ? ? ?CSN: [id number] ?Arrival date & time: 08/23/21  1256 ? ? ?  ? ?History   ?Chief Complaint ?Chief Complaint  ?Patient presents with  ? Sore Throat  ?  Swollen and sore throat woth white spots both of my children have strep, knee pain and swelling - Entered by patient  ? ? ?HPI ?[name] is a 36 y.o. female.  ? ?Presenting today with 3-day history of right knee pain, swelling.  States she has had 2 past knee surgeries to this knee and it tends to flareup similar to this anytime she is more active than usual or steps wrong.  She denies discoloration, weakness, numbness, tingling, loss of range of motion.  Has been trying RICE protocol at home with no relief.  States she typically needs an anti-inflammatory agent when this happens.  She is also had 2 days of sore, swollen feeling throat and fever.  She denies dysphagia, difficulty breathing, cough, congestion, abdominal pain, nausea vomiting or diarrhea.  Taking over-the-counter cold and flu medication ibuprofen with minimal relief.  States both of her children have strep currently. ? ? ?Past Medical History:  ?Diagnosis Date  ? Borderline personality disorder (HCC)   ? Hypertension in pregnancy   ? Knee pain   ? Nephrolithiasis   ? Postpartum depression   ? Preeclampsia   ? ? ?There are no problems to display for this patient. ? ? ?Past Surgical History:  ?Procedure Laterality Date  ? APPENDECTOMY    ? CESAREAN SECTION    ? CYSTOSCOPY/URETEROSCOPY/HOLMIUM LASER/STENT PLACEMENT Right 04/16/2021  ? Procedure: CYSTOSCOPY RIGHT RETROGRADE PYELOGRAM  URETEROSCOPY/HOLMIUM LASER/STENT PLACEMENT;  Surgeon: Bjorn Pippin, MD;  Location: [facility];  Service: Urology;  Laterality: Right;  ? HOLMIUM LASER APPLICATION Right 04/16/2021  ? Procedure: HOLMIUM LASER APPLICATION;  Surgeon: Bjorn Pippin, MD;  Location: [facility];  Service: Urology;  Laterality: Right;  ? KIDNEY STONE SURGERY  04/16/2021  ? r-kidney  ? KNEE  SURGERY Right   ? scoped x2  ? WISDOM TOOTH EXTRACTION  2010  ? ? ?OB History   ? ? Gravida  ?3  ? Para  ?   ? Term  ?   ? Preterm  ?   ? AB  ?1  ? Living  ?2  ?  ? ? SAB  ?1  ? IAB  ?   ? Ectopic  ?   ? Multiple  ?   ? Live Births  ?   ?   ?  ?  ? ? ? ?Home Medications   ? ?Prior to Admission medications   ?Medication Sig Start Date End Date Taking? Authorizing Provider  ?azithromycin (ZITHROMAX) 250 MG tablet Take first 2 tablets together, then 1 every day until finished. 08/23/21  Yes Particia Nearing, PA-C  ?lidocaine (XYLOCAINE) 2 % solution Use as directed 10 mLs in the mouth or throat every 3 (three) hours as needed for mouth pain. 08/23/21  Yes Particia Nearing, PA-C  ?predniSONE (DELTASONE) 20 MG tablet Take 2 tablets (40 mg total) by mouth daily with breakfast. 08/23/21  Yes Particia Nearing, PA-C  ?albuterol (VENTOLIN HFA) 108 (90 Base) MCG/ACT inhaler Inhale 2 puffs into the lungs every 4 (four) hours as needed. 04/09/19 10/23/19  [provider]  ? ? ?Family History ?Family History  ?Problem Relation Age of Onset  ? Hypertension Mother   ? COPD Mother   ? Healthy Father   ?  Stroke Maternal Grandmother   ? Diabetes Maternal Grandfather   ? Hypertension Maternal Grandfather   ? ? ?Social History ?Social History  ? ?Tobacco Use  ? Smoking status: Never  ? Smokeless tobacco: Never  ?Vaping Use  ? Vaping Use: Never used  ?Substance Use Topics  ? Alcohol use: No  ? Drug use: Not Currently  ?  Types: Marijuana  ?  Comment: last used 02/20/13  ? ? ? ?Allergies   ?Sodium hypochlorite, Keflex [cephalexin], Latex, and Skin bleaching [hydroquinone] ? ? ?Review of Systems ?Review of Systems ?Per HPI ? ?Physical Exam ?Triage Vital Signs ?ED Triage Vitals  ?Enc Vitals Group  ?   BP 08/23/21 1314 (!) 131/92  ?   Pulse Rate 08/23/21 1314 89  ?   Resp 08/23/21 1314 18  ?   Temp 08/23/21 1314 99 ?F (37.2 ?C)  ?   Temp Source 08/23/21 1314 Oral  ?   SpO2 08/23/21 1314 98 %  ?   Weight --   ?   Height  --   ?   Head Circumference --   ?   Peak Flow --   ?   Pain Score 08/23/21 1312 4  ?   Pain Loc --   ?   Pain Edu? --   ?   Excl. in GC? --   ? ?No data found. ? ?Updated Vital Signs ?BP (!) 131/92 (BP Location: Right Arm)   Pulse 89   Temp 99 ?F (37.2 ?C) (Oral)   Resp 18   LMP 07/24/2021 (Approximate)   SpO2 98%  ? ?Visual Acuity ?Right Eye Distance:   ?Left Eye Distance:   ?Bilateral Distance:   ? ?Right Eye Near:   ?Left Eye Near:    ?Bilateral Near:    ? ?Physical Exam ?Vitals and nursing note reviewed.  ?Constitutional:   ?   Appearance: Normal appearance.  ?HENT:  ?   Head: Atraumatic.  ?   Right Ear: Tympanic membrane and external ear normal.  ?   Left Ear: Tympanic membrane and external ear normal.  ?   Nose: Nose normal.  ?   Mouth/Throat:  ?   Mouth: Mucous membranes are moist.  ?   Pharynx: Posterior oropharyngeal erythema present. No oropharyngeal exudate.  ?Eyes:  ?   Extraocular Movements: Extraocular movements intact.  ?   Conjunctiva/sclera: Conjunctivae normal.  ?Cardiovascular:  ?   Rate and Rhythm: Normal rate and regular rhythm.  ?   Heart sounds: Normal heart sounds.  ?Pulmonary:  ?   Effort: Pulmonary effort is normal.  ?   Breath sounds: Normal breath sounds. No wheezing or rales.  ?Musculoskeletal:     ?   General: Swelling and tenderness present. No deformity or signs of injury. Normal range of motion.  ?   Cervical back: Normal range of motion and neck supple.  ?   Comments: Trace edema anterior right knee, tenderness to palpation bilateral joint lines of the right knee.  Range of motion intact.  Normal gait.  Negative McMurray's, drawer testing.  ?Lymphadenopathy:  ?   Cervical: No cervical adenopathy.  ?Skin: ?   General: Skin is warm and dry.  ?   Findings: No bruising, erythema or rash.  ?Neurological:  ?   Mental Status: She is alert and oriented to person, place, and time.  ?   Comments: Right lower extremity neurovascularly intact  ?Psychiatric:     ?   Mood and Affect: Mood  normal.     ?  Thought Content: Thought content normal.  ? ? ? ?UC Treatments / Results  ?Labs ?(all labs ordered are listed, but only abnormal results are displayed) ?Labs Reviewed  ?CULTURE, GROUP A STREP [facility])  ?POCT RAPID STREP A (OFFICE)  ? ? ?EKG ? ? ?Radiology ?No results found. ? ?Procedures ?Procedures (including critical care time) ? ?Medications Ordered in UC ?Medications - No data to display ? ?Initial Impression / Assessment and Plan / UC Course  ?I have reviewed the triage vital signs and the nursing notes. ? ?Pertinent labs & imaging results that were available during my care of the patient were reviewed by me and considered in my medical decision making (see chart for details). ? ?  ? ?Rapid strep negative, throat culture pending but given consistent symptoms and exposures, will cover for bacterial tonsillitis while awaiting throat culture.  Discussed supportive over-the-counter measures and home care.  Treat with a course of prednisone for acute on chronic right knee pain and swelling.  Follow-up with orthopedics if not fully resolving.  RICE protocol ? ?Final Clinical Impressions(s) / UC Diagnoses  ? ?Final diagnoses:  ?Sore throat  ?Exposure to strep throat  ?Acute pain of right knee  ? ?Discharge Instructions   ?None ?  ? ?ED Prescriptions   ? ? Medication Sig Dispense Auth. Provider  ? predniSONE (DELTASONE) 20 MG tablet Take 2 tablets (40 mg total) by mouth daily with breakfast. 10 tablet Particia Nearing, PA-C  ? lidocaine (XYLOCAINE) 2 % solution Use as directed 10 mLs in the mouth or throat every 3 (three) hours as needed for mouth pain. 100 mL Particia Nearing, PA-C  ? azithromycin (ZITHROMAX) 250 MG tablet Take first 2 tablets together, then 1 every day until finished. 6 tablet Particia Nearing, [place]  ? ?  ? ?PDMP not reviewed this encounter. ?  ?Particia Nearing, PA-C ?08/23/21 1347 ? ?

## 2021-08-26 LAB — CULTURE, GROUP A STREP (THRC)

## 2021-09-24 ENCOUNTER — Other Ambulatory Visit (HOSPITAL_COMMUNITY)
Admission: RE | Admit: 2021-09-24 | Discharge: 2021-09-24 | Disposition: A | Discharge status: Home / Self Care | Payer: No Typology Code available for payment source | Source: Ambulatory Visit | Attending: Family Medicine | Admitting: Family Medicine

## 2021-09-24 ENCOUNTER — Ambulatory Visit (INDEPENDENT_AMBULATORY_CARE_PROVIDER_SITE_OTHER): Payer: No Typology Code available for payment source | Admitting: Family Medicine

## 2021-09-24 ENCOUNTER — Encounter: Payer: Self-pay | Admitting: Family Medicine

## 2021-09-24 VITALS — BP 122/88 | HR 84 | Temp 97.6°F | Ht 66.0 in | Wt 249.6 lb

## 2021-09-24 DIAGNOSIS — F603 Borderline personality disorder: Secondary | ICD-10-CM

## 2021-09-24 DIAGNOSIS — Z0001 Encounter for general adult medical examination with abnormal findings: Secondary | ICD-10-CM | POA: Diagnosis not present

## 2021-09-24 DIAGNOSIS — Z124 Encounter for screening for malignant neoplasm of cervix: Secondary | ICD-10-CM | POA: Diagnosis present

## 2021-09-24 DIAGNOSIS — M79644 Pain in right finger(s): Secondary | ICD-10-CM

## 2021-09-24 DIAGNOSIS — Z1151 Encounter for screening for human papillomavirus (HPV): Secondary | ICD-10-CM | POA: Insufficient documentation

## 2021-09-24 DIAGNOSIS — Z113 Encounter for screening for infections with a predominantly sexual mode of transmission: Secondary | ICD-10-CM | POA: Diagnosis present

## 2021-09-24 DIAGNOSIS — E782 Mixed hyperlipidemia: Secondary | ICD-10-CM

## 2021-09-24 DIAGNOSIS — Z0000 Encounter for general adult medical examination without abnormal findings: Secondary | ICD-10-CM

## 2021-09-24 MED ORDER — NAPROXEN 500 MG PO TABS: 500.0000 mg | ORAL_TABLET | Freq: Two times a day (BID) | ORAL | 0 refills | Status: DC

## 2021-09-24 NOTE — Progress Notes (Unsigned)
Assessment & Plan:  Well adult exam Discussed health benefits of physical activity, and encouraged her to engage in regular exercise appropriate for her age and condition. Preventive health education provided. Patient declined COVID booster and hepatitis C screening.  Immunization History  Administered Date(s) Administered   Influenza,inj,Quad PF,6+ Mos 02/03/2016   Influenza-Unspecified 02/03/2016   MMR 02/12/2008   Moderna Sars-Covid-2 Vaccination 06/09/2020, 07/07/2020   Tdap 02/12/2008, 02/03/2016   Health Maintenance  Topic Date Due   PAP SMEAR-Modifier  05/04/2013   COVID-19 Vaccine (3 - Moderna series) 10/10/2021 (Originally 09/01/2020)   Hepatitis C Screening  05/28/2022 (Originally 01/20/2006)   INFLUENZA VACCINE  11/09/2021   TETANUS/TDAP  02/02/2026   HIV Screening  Completed   HPV VACCINES  Aged Out   - CBC with Differential/Platelet - CMP14+EGFR - Lipid panel  2. Routine screening for STI (sexually transmitted infection) - Cytology - PAP  3. Screening for cervical cancer - Cytology - PAP  4. Screening for human papillomavirus (HPV) - Cytology - PAP  5. Pain of right thumb - naproxen (NAPROSYN) 500 MG tablet; Take 1 tablet (500 mg total) by mouth 2 (two) times daily with a meal.  Dispense: 60 tablet; Refill: 0  6. Borderline personality disorder [facility]) Previously referred to psychiatry. Had an appointment, got new insurance and needs to schedule next appointment.    Follow-up: Return in about 1 year (around 09/25/2022) for annual physical.   Hendricks Limes, MSN, APRN, FNP-C [facility]  Subjective:  Patient ID: [name], female    DOB: [birth year]  Age: 36 y.o. MRN: [id number]  Patient Care Team: Loman Brooklyn, FNP as PCP - General (Family Medicine)   CC:  Chief Complaint  Patient presents with   Gynecologic Exam    HPI [name] is a 36 y.o. female who presents today for a complete physical exam. She reports  consuming a general diet. Home exercise routine includes walking 2-3 hrs per day. She generally feels well. She reports sleeping fairly well. She does have additional problems to discuss today.   Vision:Within last year Dental:No regular dental care  Patient reports right thumb pain that has been present over a week. She thinks she may have laid on it wrong as she denies any accident or injury.      09/24/2021   11:20 AM 05/28/2021    1:17 PM  GAD 7 : Generalized Anxiety Score  Nervous, Anxious, on Edge 2 3  Control/stop worrying 2 3  Worry too much - different things 2 2  Trouble relaxing 2 3  Restless 1 1  Easily annoyed or irritable 3 3  Afraid - awful might happen 0 1  Total GAD 7 Score 12 16  Anxiety Difficulty Somewhat difficult Very difficult      09/24/2021   11:20 AM 05/28/2021    1:16 PM  Depression screen PHQ 2/9  Decreased Interest 2 2  Down, Depressed, Hopeless 2 2  PHQ - 2 Score 4 4  Altered sleeping 3 2  Tired, decreased energy 3 3  Change in appetite 1 1  Feeling bad or failure about yourself  3 2  Trouble concentrating 1 2  Moving slowly or fidgety/restless 0 1  Suicidal thoughts 0 1  PHQ-9 Score 15 16  Difficult doing work/chores Somewhat difficult Very difficult    Review of Systems  Constitutional:  Negative for chills, fever, malaise/fatigue and weight loss.  HENT:  Positive for ear pain. Negative for congestion,  ear discharge, nosebleeds, sinus pain, sore throat and tinnitus.   Eyes:  Negative for blurred vision, double vision, pain, discharge and redness.  Respiratory:  Negative for cough, shortness of breath and wheezing.   Cardiovascular:  Negative for chest pain, palpitations and leg swelling.  Gastrointestinal:  Negative for abdominal pain, constipation, diarrhea, heartburn, nausea and vomiting.  Genitourinary:  Negative for dysuria, frequency and urgency.  Musculoskeletal:  Positive for joint pain. Negative for myalgias.  Skin:  Negative for  rash.  Neurological:  Negative for dizziness, seizures, weakness and headaches.  Psychiatric/Behavioral:  Positive for depression. Negative for substance abuse and suicidal ideas. The patient is nervous/anxious and has insomnia.     No current outpatient medications on file.  Allergies  Allergen Reactions   Sodium Hypochlorite Shortness Of Breath   Keflex [Cephalexin]    Latex    Skin Bleaching [Hydroquinone]     Facial swelling, SOB    Past Medical History:  Diagnosis Date   Borderline personality disorder ([place])    Hypertension in pregnancy    Knee pain    Nephrolithiasis    Postpartum depression    Preeclampsia     Past Surgical History:  Procedure Laterality Date   APPENDECTOMY     CESAREAN SECTION     CYSTOSCOPY/URETEROSCOPY/HOLMIUM LASER/STENT PLACEMENT Right 04/16/2021   Procedure: CYSTOSCOPY RIGHT RETROGRADE PYELOGRAM  URETEROSCOPY/HOLMIUM LASER/STENT PLACEMENT;  Surgeon: Irine Seal, MD;  Location: [facility];  Service: Urology;  Laterality: Right;   HOLMIUM LASER APPLICATION Right 01/00/7121   Procedure: HOLMIUM LASER APPLICATION;  Surgeon: Irine Seal, MD;  Location: [facility];  Service: Urology;  Laterality: Right;   KIDNEY STONE SURGERY  04/16/2021   r-kidney   KNEE SURGERY Right    scoped x2   WISDOM TOOTH EXTRACTION  2010    Family History  Problem Relation Age of Onset   Hypertension Mother    COPD Mother    Healthy Father    Stroke Maternal Grandmother    Diabetes Maternal Grandfather    Hypertension Maternal Grandfather     Social History   Socioeconomic History   Marital status: Divorced    Spouse name: Not on file   Number of children: Not on file   Years of education: Not on file   Highest education level: Not on file  Occupational History   Not on file  Tobacco Use   Smoking status: Never   Smokeless tobacco: Never  Vaping Use   Vaping Use: Never used  Substance and Sexual Activity   Alcohol use:  No   Drug use: Not Currently    Types: Marijuana    Comment: last used 02/20/13   Sexual activity: Yes    Birth control/protection: None  Other Topics Concern   Not on file  Social History Narrative   Not on file   Social Determinants of Health   Financial Resource Strain: Not on file  Food Insecurity: Not on file  Transportation Needs: Not on file  Physical Activity: Not on file  Stress: Not on file  Social Connections: Not on file  Intimate Partner Violence: Not on file      Objective:    BP 122/88   Pulse 84   Temp 97.6 F (36.4 C) (Temporal)   Ht '5\' 6"'  (1.676 m)   Wt 249 lb 9.6 oz (113.2 kg)   LMP 09/24/2021   SpO2 95%   BMI 40.29 kg/m   Wt Readings from Last 3 Encounters:  09/24/21 249 lb 9.6 oz (113.2 kg)  05/28/21 254 lb 6.4 oz (115.4 kg)  04/16/21 249 lb 4.8 oz (113.1 kg)    Physical Exam Vitals reviewed. Exam conducted with a chaperone present Janett Billow R.).  Constitutional:      General: She is not in acute distress.    Appearance: Normal appearance. She is morbidly obese. She is not ill-appearing, toxic-appearing or diaphoretic.  HENT:     Head: Normocephalic and atraumatic.     Right Ear: Tympanic membrane, ear canal and external ear normal. There is no impacted cerumen.     Left Ear: Tympanic membrane, ear canal and external ear normal. There is no impacted cerumen.     Nose: Nose normal. No congestion or rhinorrhea.     Mouth/Throat:     Mouth: Mucous membranes are moist.     Pharynx: Oropharynx is clear. No oropharyngeal exudate or posterior oropharyngeal erythema.  Eyes:     General: No scleral icterus.       Right eye: No discharge.        Left eye: No discharge.     Conjunctiva/sclera: Conjunctivae normal.     Pupils: Pupils are equal, round, and reactive to light.  Cardiovascular:     Rate and Rhythm: Normal rate and regular rhythm.     Heart sounds: Normal heart sounds. No murmur heard.    No friction rub. No gallop.  Pulmonary:      Effort: Pulmonary effort is normal. No respiratory distress.     Breath sounds: Normal breath sounds. No stridor. No wheezing, rhonchi or rales.  Chest:  Breasts:    Breasts are symmetrical.     Right: Normal.     Left: Normal.  Abdominal:     General: Abdomen is flat. Bowel sounds are normal. There is no distension.     Palpations: Abdomen is soft. There is no hepatomegaly, splenomegaly or mass.     Tenderness: There is no abdominal tenderness. There is no guarding or rebound.     Hernia: No hernia is present.  Genitourinary:    Pubic Area: No rash or pubic lice.      Labia:        Right: No rash, tenderness, lesion or injury.        Left: No rash, tenderness, lesion or injury.      Vagina: No signs of injury and foreign body. Bleeding (menstrual) present. No erythema, tenderness, lesions or prolapsed vaginal walls.     Cervix: Normal.     Uterus: Normal.      Adnexa: Right adnexa normal and left adnexa normal.  Musculoskeletal:        General: Normal range of motion.     Cervical back: Normal range of motion and neck supple. No rigidity. No muscular tenderness.  Lymphadenopathy:     Cervical: No cervical adenopathy.     Upper Body:     Right upper body: No supraclavicular, axillary or pectoral adenopathy.     Left upper body: No supraclavicular, axillary or pectoral adenopathy.  Skin:    General: Skin is warm and dry.     Capillary Refill: Capillary refill takes less than 2 seconds.  Neurological:     General: No focal deficit present.     Mental Status: She is alert and oriented to person, place, and time. Mental status is at baseline.  Psychiatric:        Mood and Affect: Mood normal.        Behavior:  Behavior normal.        Thought Content: Thought content normal.        Judgment: Judgment normal.     Lab Results  Component Value Date   TSH 0.818 09/17/2015   Lab Results  Component Value Date   WBC 7.8 03/27/2020   HGB 12.9 04/16/2021   HCT 38.0 04/16/2021    MCV 92.8 03/27/2020   PLT 228 03/27/2020   Lab Results  Component Value Date   NA 138 04/16/2021   K 4.2 04/16/2021   CO2 20 (L) 03/27/2020   GLUCOSE 88 04/16/2021   BUN 8 04/16/2021   CREATININE 0.70 04/16/2021   BILITOT 1.8 (H) 03/27/2020   ALKPHOS 114 03/27/2020   AST 57 (H) 03/27/2020   ALT 65 (H) 03/27/2020   PROT 7.6 03/27/2020   ALBUMIN 4.0 03/27/2020   CALCIUM 8.7 (L) 03/27/2020   ANIONGAP 10 03/27/2020   No results found for: "CHOL" No results found for: "HDL" No results found for: "LDLCALC" No results found for: "TRIG" No results found for: "CHOLHDL" Lab Results  Component Value Date   HGBA1C 5.0 09/17/2015

## 2021-09-25 DIAGNOSIS — E785 Hyperlipidemia, unspecified: Secondary | ICD-10-CM | POA: Insufficient documentation

## 2021-09-25 HISTORY — DX: Hyperlipidemia, unspecified: E78.5

## 2021-09-25 LAB — LIPID PANEL
Chol/HDL Ratio: 5.5 ratio — ABNORMAL HIGH (ref 0.0–4.4)
Cholesterol, Total: 302 mg/dL — ABNORMAL HIGH (ref 100–199)
HDL: 55 mg/dL (ref >39)
LDL Chol Calc (NIH): 196 mg/dL — ABNORMAL HIGH (ref 0–99)
Triglycerides: 261 mg/dL — ABNORMAL HIGH (ref 0–149)
VLDL Cholesterol Cal: 51 mg/dL — ABNORMAL HIGH (ref 5–40)

## 2021-09-25 LAB — CBC WITH DIFFERENTIAL/PLATELET
Basophils Absolute: 0.1 10*3/uL (ref 0.0–0.2)
Basos: 1 %
EOS (ABSOLUTE): 0.1 10*3/uL (ref 0.0–0.4)
Eos: 2 %
Hematocrit: 37.3 % (ref 34.0–46.6)
Hemoglobin: 12.7 g/dL (ref 11.1–15.9)
Immature Grans (Abs): 0 10*3/uL (ref 0.0–0.1)
Immature Granulocytes: 0 %
Lymphocytes Absolute: 2 10*3/uL (ref 0.7–3.1)
Lymphs: 54 %
MCH: 31.4 pg (ref 26.6–33.0)
MCHC: 34 g/dL (ref 31.5–35.7)
MCV: 92 fL (ref 79–97)
Monocytes Absolute: 0.3 10*3/uL (ref 0.1–0.9)
Monocytes: 7 %
Neutrophils Absolute: 1.3 10*3/uL — ABNORMAL LOW (ref 1.4–7.0)
Neutrophils: 36 %
Platelets: 350 10*3/uL (ref 150–450)
RBC: 4.04 x10E6/uL (ref 3.77–5.28)
RDW: 13 % (ref 11.7–15.4)
WBC: 3.8 10*3/uL (ref 3.4–10.8)

## 2021-09-25 LAB — CMP14+EGFR
ALT: 45 IU/L — ABNORMAL HIGH (ref 0–32)
AST: 29 IU/L (ref 0–40)
Albumin/Globulin Ratio: 2 (ref 1.2–2.2)
Albumin: 4.5 g/dL (ref 3.8–4.8)
Alkaline Phosphatase: 66 IU/L (ref 44–121)
BUN/Creatinine Ratio: 12 (ref 9–23)
BUN: 9 mg/dL (ref 6–20)
Bilirubin Total: 0.4 mg/dL (ref 0.0–1.2)
CO2: 18 mmol/L — ABNORMAL LOW (ref 20–29)
Calcium: 9.5 mg/dL (ref 8.7–10.2)
Chloride: 103 mmol/L (ref 96–106)
Creatinine, Ser: 0.75 mg/dL (ref 0.57–1.00)
Globulin, Total: 2.2 g/dL (ref 1.5–4.5)
Glucose: 81 mg/dL (ref 70–99)
Potassium: 4.3 mmol/L (ref 3.5–5.2)
Sodium: 141 mmol/L (ref 134–144)
Total Protein: 6.7 g/dL (ref 6.0–8.5)
eGFR: 108 mL/min/{1.73_m2} (ref >59)

## 2021-09-27 ENCOUNTER — Encounter: Payer: Self-pay | Admitting: Family Medicine

## 2021-09-27 DIAGNOSIS — E782 Mixed hyperlipidemia: Secondary | ICD-10-CM

## 2021-09-27 DIAGNOSIS — F603 Borderline personality disorder: Secondary | ICD-10-CM | POA: Insufficient documentation

## 2021-09-28 LAB — CYTOLOGY - PAP
Chlamydia: NEGATIVE
Comment: NEGATIVE
Comment: NEGATIVE
Comment: NEGATIVE
Comment: NORMAL
Diagnosis: NEGATIVE
High risk HPV: NEGATIVE
Neisseria Gonorrhea: NEGATIVE
Trichomonas: NEGATIVE

## 2021-09-28 MED ORDER — ROSUVASTATIN CALCIUM 5 MG PO TABS: 5.0000 mg | ORAL_TABLET | Freq: Every day | ORAL | 2 refills | Status: DC

## 2021-09-28 MED ORDER — WEGOVY 0.25 MG/0.5ML ~~LOC~~ SOAJ: 0.2500 mg | SUBCUTANEOUS | 0 refills | Status: DC

## 2021-09-28 NOTE — Addendum Note (Signed)
Addended by: Gwenlyn Fudge on: 09/28/2021 04:07 PM   Modules accepted: Orders

## 2021-10-01 ENCOUNTER — Telehealth: Payer: Self-pay

## 2021-10-05 NOTE — Telephone Encounter (Signed)
Denial - wegovy  Drug not covered/plan exclusion- your request for coverage was denied because your prescription benefit plan does not cover the requested medication.

## 2021-10-06 NOTE — Telephone Encounter (Signed)
Please make patient aware of the denial.

## 2021-10-06 NOTE — Telephone Encounter (Signed)
Patient aware and verbalizes understanding. 

## 2021-10-21 ENCOUNTER — Other Ambulatory Visit: Payer: Self-pay | Admitting: Family Medicine

## 2021-10-21 DIAGNOSIS — M79644 Pain in right finger(s): Secondary | ICD-10-CM

## 2021-12-01 ENCOUNTER — Encounter: Payer: Self-pay | Admitting: *Deleted

## 2022-04-26 ENCOUNTER — Encounter: Payer: Self-pay | Admitting: *Deleted

## 2022-05-23 ENCOUNTER — Ambulatory Visit
Admission: RE | Admit: 2022-05-23 | Discharge: 2022-05-23 | Disposition: A | Discharge status: Home / Self Care | Payer: No Typology Code available for payment source | Source: Ambulatory Visit | Attending: Nurse Practitioner | Admitting: Nurse Practitioner

## 2022-05-23 VITALS — BP 128/91 | HR 87 | Temp 98.7°F | Resp 18

## 2022-05-23 DIAGNOSIS — L02416 Cutaneous abscess of left lower limb: Secondary | ICD-10-CM

## 2022-05-23 MED ORDER — DOXYCYCLINE HYCLATE 100 MG PO TABS: 100.0000 mg | ORAL_TABLET | Freq: Two times a day (BID) | ORAL | 0 refills | Status: AC

## 2022-05-23 NOTE — ED Triage Notes (Signed)
Pt reports  abscess in the groin area x 1 week. Reports the abscess opened yesterday.

## 2022-05-23 NOTE — Discharge Instructions (Addendum)
Take medication as prescribed. Warm compresses to the affected area 3-4 times daily. Clean the area at least twice daily with Dial Gold bar soap or another type of antibacterial soap. Keep the area covered while it is draining. Follow-up in this clinic if you continue to experience pain is, increased swelling, or if the area fails to heal. Go to the emergency department if you develop fever, chills, generalized fatigue, nausea, vomiting, or if the area of redness spreads into the genital region, or if you have foul-smelling drainage.

## 2022-05-23 NOTE — ED Provider Notes (Signed)
[facility]    CSN: [id number] Arrival date & time: 05/23/22  1047      History   Chief Complaint Chief Complaint  Patient presents with   Abscess    Entered by patient   Appointment    1100    HPI [name] is a 37 y.o. female.   The history is provided by the patient.   Patient presents for an abscess to the left inner thigh/groin area.  Symptoms started over the past week.  Patient states the area began draining over the past 24 hours.  She denies fever, chills, chest pain, abdominal pain, nausea, vomiting, or diarrhea.  Patient states her most recent abscess prior to this 1 was approximately 6 months ago.  Patient denies history of diabetes.  Past Medical History:  Diagnosis Date   Borderline personality disorder ([place])    Hyperlipidemia 09/25/2021   Hypertension in pregnancy    Knee pain    Nephrolithiasis    Postpartum depression    Preeclampsia     Patient Active Problem List   Diagnosis Date Noted   Morbid obesity ([place]) 09/29/2021   Borderline personality disorder ([place]) 09/27/2021   Hyperlipidemia 09/25/2021    Past Surgical History:  Procedure Laterality Date   APPENDECTOMY     CESAREAN SECTION     CYSTOSCOPY/URETEROSCOPY/HOLMIUM LASER/STENT PLACEMENT Right 04/16/2021   Procedure: CYSTOSCOPY RIGHT RETROGRADE PYELOGRAM  URETEROSCOPY/HOLMIUM LASER/STENT PLACEMENT;  Surgeon: Irine Seal, MD;  Location: [place];  Service: Urology;  Laterality: Right;   HOLMIUM LASER APPLICATION Right [id number]   Procedure: HOLMIUM LASER APPLICATION;  Surgeon: Irine Seal, MD;  Location: [facility];  Service: Urology;  Laterality: Right;   KIDNEY STONE SURGERY  04/16/2021   r-kidney   KNEE SURGERY Right    scoped x2   WISDOM TOOTH EXTRACTION  2010    OB History     Gravida  3   Para      Term      Preterm      AB  1   Living  2      SAB  1   IAB      Ectopic      Multiple      Live Births                Home Medications    Prior to Admission medications   Medication Sig Start Date End Date Taking? Authorizing Provider  naproxen (NAPROSYN) 500 MG tablet TAKE 1 TABLET BY MOUTH 2 TIMES DAILY WITH A MEAL. 10/21/21   Loman Brooklyn, FNP  rosuvastatin (CRESTOR) 5 MG tablet Take 1 tablet (5 mg total) by mouth daily. 09/28/21   Loman Brooklyn, FNP  Semaglutide-Weight Management (WEGOVY) 0.25 MG/0.5ML SOAJ Inject 0.25 mg into the skin once a week. 09/28/21   Loman Brooklyn, FNP  albuterol (VENTOLIN HFA) 108 (90 Base) MCG/ACT inhaler Inhale 2 puffs into the lungs every 4 (four) hours as needed. 04/09/19 10/23/19  [provider]    Family History Family History  Problem Relation Age of Onset   Hypertension Mother    COPD Mother    Healthy Father    Stroke Maternal Grandmother    Diabetes Maternal Grandfather    Hypertension Maternal Grandfather     Social History Social History   Tobacco Use   Smoking status: Never   Smokeless tobacco: Never  Vaping Use   Vaping Use: Never used  Substance Use Topics  Alcohol use: No   Drug use: Not Currently    Types: Marijuana    Comment: last used 02/20/13     Allergies   Sodium hypochlorite, Keflex [cephalexin], Latex, and Skin bleaching [hydroquinone]   Review of Systems Review of Systems Per HPI  Physical Exam Triage Vital Signs ED Triage Vitals  Enc Vitals Group     BP 05/23/22 1140 (!) 128/91     Pulse Rate 05/23/22 1140 87     Resp 05/23/22 1140 18     Temp 05/23/22 1140 98.7 F (37.1 C)     Temp Source 05/23/22 1140 Oral     SpO2 05/23/22 1140 98 %     Weight --      Height --      Head Circumference --      Peak Flow --      Pain Score 05/23/22 1143 8     Pain Loc --      Pain Edu? --      Excl. in [place]? --    No data found.  Updated Vital Signs BP (!) 128/91 (BP Location: Right Arm)   Pulse 87   Temp 98.7 F (37.1 C) (Oral)   Resp 18   LMP  (Within Weeks) Comment: 1 week  SpO2 98%    Visual Acuity Right Eye Distance:   Left Eye Distance:   Bilateral Distance:    Right Eye Near:   Left Eye Near:    Bilateral Near:     Physical Exam Vitals and nursing note reviewed.  Constitutional:      General: She is not in acute distress.    Appearance: Normal appearance.  HENT:     Head: Normocephalic.  Eyes:     Extraocular Movements: Extraocular movements intact.     Conjunctiva/sclera: Conjunctivae normal.     Pupils: Pupils are equal, round, and reactive to light.  Cardiovascular:     Rate and Rhythm: Normal rate and regular rhythm.     Pulses: Normal pulses.     Heart sounds: Normal heart sounds.  Pulmonary:     Effort: Pulmonary effort is normal. No respiratory distress.     Breath sounds: Normal breath sounds. No stridor. No wheezing, rhonchi or rales.  Abdominal:     General: Bowel sounds are normal.     Palpations: Abdomen is soft.     Tenderness: There is no abdominal tenderness.  Musculoskeletal:     Cervical back: Normal range of motion.  Lymphadenopathy:     Cervical: No cervical adenopathy.  Skin:    General: Skin is warm and dry.     Findings: Abscess present.          Comments: Abscess noted to the left groin/lower upper thigh region.    Area is actively draining serosanguineous drainage at this time.  There is no fluctuance present.  Neurological:     General: No focal deficit present.     Mental Status: She is alert and oriented to person, place, and time.  Psychiatric:        Mood and Affect: Mood normal.        Behavior: Behavior normal.      UC Treatments / Results  Labs (all labs ordered are listed, but only abnormal results are displayed) Labs Reviewed - No data to display  EKG   Radiology No results found.  Procedures Procedures (including critical care time)  Medications Ordered in UC Medications - No data to display  Initial  Impression / Assessment and Plan / UC Course  I have reviewed the triage vital signs and  the nursing notes.  Pertinent labs & imaging results that were available during my care of the patient were reviewed by me and considered in my medical decision making (see chart for details).  The patient is well-appearing, she is in no acute distress, vital signs are stable.  Symptoms are consistent with a left thigh/groin abscess.  Will treat with doxycycline 100 mg twice daily for the next 10 days.  Supportive care recommendations were provided to the patient to include warm compresses, cleansing the area with an antibacterial soap, and over-the-counter analgesics such as Tylenol or ibuprofen as needed for pain or discomfort.  ER precautions were provided to the patient along with wound follow-up in this clinic may be indicated.  Patient was in agreement with this plan of care.  Patient verbalizes understanding.  All questions were answered.  Patient is stable for discharge.   Final Clinical Impressions(s) / UC Diagnoses   Final diagnoses:  None   Discharge Instructions   None    ED Prescriptions   None    PDMP not reviewed this encounter.   Tish Men, NP 05/23/22 1245

## 2022-07-25 ENCOUNTER — Ambulatory Visit
Admission: RE | Admit: 2022-07-25 | Discharge: 2022-07-25 | Disposition: A | Discharge status: Home / Self Care | Payer: Medicaid Other | Source: Ambulatory Visit | Attending: Nurse Practitioner | Admitting: Nurse Practitioner

## 2022-07-25 VITALS — BP 126/88 | HR 85 | Temp 98.6°F | Resp 20

## 2022-07-25 DIAGNOSIS — J069 Acute upper respiratory infection, unspecified: Secondary | ICD-10-CM

## 2022-07-25 DIAGNOSIS — R062 Wheezing: Secondary | ICD-10-CM

## 2022-07-25 DIAGNOSIS — Z1152 Encounter for screening for COVID-19: Secondary | ICD-10-CM

## 2022-07-25 MED ORDER — ALBUTEROL SULFATE (2.5 MG/3ML) 0.083% IN NEBU
2.5000 mg | INHALATION_SOLUTION | Freq: Once | RESPIRATORY_TRACT | Status: AC
  Administered 2022-07-25: 2.5 mg via RESPIRATORY_TRACT

## 2022-07-25 MED ORDER — PROMETHAZINE-DM 6.25-15 MG/5ML PO SYRP: 5.0000 mL | ORAL_SOLUTION | Freq: Every evening | ORAL | 0 refills | Status: DC | PRN

## 2022-07-25 MED ORDER — PREDNISONE 20 MG PO TABS: 40.0000 mg | ORAL_TABLET | Freq: Every day | ORAL | 0 refills | Status: AC

## 2022-07-25 MED ORDER — ALBUTEROL SULFATE HFA 108 (90 BASE) MCG/ACT IN AERS: 1.0000 | INHALATION_SPRAY | Freq: Four times a day (QID) | RESPIRATORY_TRACT | 0 refills | Status: DC | PRN

## 2022-07-25 MED ORDER — BENZONATATE 100 MG PO CAPS: 100.0000 mg | ORAL_CAPSULE | Freq: Three times a day (TID) | ORAL | 0 refills | Status: DC | PRN

## 2022-07-25 NOTE — ED Provider Notes (Signed)
[facility]    CSN: [id number] Arrival date & time: 07/25/22  0944      History   Chief Complaint Chief Complaint  Patient presents with   Nasal Congestion    Entered by patient    HPI [name] is a 37 y.o. female.   Patient presents today for 3 to 4 days of bodyaches, chills, congested cough, pain with inspiration, shortness of breath and feeling lightheaded with activity, chest pain after coughing, chest and nasal congestion, runny nose, sore throat, right ear pain without drainage, nausea, diarrhea that began this morning, and decreased appetite.  Energy levels have also been decreased.  No known fevers at home, dry cough, chest pain at rest, wheezing, chest tightness, abdominal pain or vomiting.  No known sick contacts.  Mom has been taking Alka-Seltzer cold and cough, Tylenol, ibuprofen, and Zyrtec for symptoms which seems to help minimally.  Reports she and daughter were at the beach when symptoms began.  Mom reports history of asthma as a child.  She reports multiple episodes of bronchitis as an adult and is requesting empiric treatment with antibiotic therapy.    Past Medical History:  Diagnosis Date   Borderline personality disorder    Hyperlipidemia 09/25/2021   Hypertension in pregnancy    Knee pain    Nephrolithiasis    Postpartum depression    Preeclampsia     Patient Active Problem List   Diagnosis Date Noted   Morbid obesity 09/29/2021   Borderline personality disorder 09/27/2021   Hyperlipidemia 09/25/2021    Past Surgical History:  Procedure Laterality Date   APPENDECTOMY     CESAREAN SECTION     CYSTOSCOPY/URETEROSCOPY/HOLMIUM LASER/STENT PLACEMENT Right 04/16/2021   Procedure: CYSTOSCOPY RIGHT RETROGRADE PYELOGRAM  URETEROSCOPY/HOLMIUM LASER/STENT PLACEMENT;  Surgeon: Bjorn Pippin, MD;  Location: [facility];  Service: Urology;  Laterality: Right;   HOLMIUM LASER APPLICATION Right 04/16/2021   Procedure: HOLMIUM LASER  APPLICATION;  Surgeon: Bjorn Pippin, MD;  Location: [facility];  Service: Urology;  Laterality: Right;   KIDNEY STONE SURGERY  04/16/2021   r-kidney   KNEE SURGERY Right    scoped x2   WISDOM TOOTH EXTRACTION  2010    OB History     Gravida  3   Para      Term      Preterm      AB  1   Living  2      SAB  1   IAB      Ectopic      Multiple      Live Births               Home Medications    Prior to Admission medications   Medication Sig Start Date End Date Taking? Authorizing Provider  albuterol (VENTOLIN HFA) 108 (90 Base) MCG/ACT inhaler Inhale 1-2 puffs into the lungs every 6 (six) hours as needed for wheezing or shortness of breath. 07/25/22  Yes Valentino Nose, NP  benzonatate (TESSALON) 100 MG capsule Take 1 capsule (100 mg total) by mouth 3 (three) times daily as needed for cough. Do not take with alcohol or while driving or operating heavy machinery.  May cause drowsiness. 07/25/22  Yes Valentino Nose, NP  predniSONE (DELTASONE) 20 MG tablet Take 2 tablets (40 mg total) by mouth daily with breakfast for 5 days. 07/25/22 07/30/22 Yes Valentino Nose, NP  promethazine-dextromethorphan (PROMETHAZINE-DM) 6.25-15 MG/5ML syrup Take 5 mLs by mouth  at bedtime as needed for cough. Do not take with alcohol or while driving or operating heavy machinery.  May cause drowsiness. 07/25/22  Yes Cathlean Marseilles A, NP  naproxen (NAPROSYN) 500 MG tablet TAKE 1 TABLET BY MOUTH 2 TIMES DAILY WITH A MEAL. 10/21/21   Gwenlyn Fudge, FNP  rosuvastatin (CRESTOR) 5 MG tablet Take 1 tablet (5 mg total) by mouth daily. 09/28/21   Gwenlyn Fudge, FNP  Semaglutide-Weight Management (WEGOVY) 0.25 MG/0.5ML SOAJ Inject 0.25 mg into the skin once a week. 09/28/21   Gwenlyn Fudge, FNP    Family History Family History  Problem Relation Age of Onset   Hypertension Mother    COPD Mother    Healthy Father    Stroke Maternal Grandmother    Diabetes Maternal  Grandfather    Hypertension Maternal Grandfather     Social History Social History   Tobacco Use   Smoking status: Never   Smokeless tobacco: Never  Vaping Use   Vaping Use: Never used  Substance Use Topics   Alcohol use: No   Drug use: Not Currently    Types: Marijuana    Comment: last used 02/20/13     Allergies   Sodium hypochlorite, Keflex [cephalexin], Latex, and Skin bleaching [hydroquinone]   Review of Systems Review of Systems Per HPI  Physical Exam Triage Vital Signs ED Triage Vitals  Enc Vitals Group     BP 07/25/22 0958 126/88     Pulse Rate 07/25/22 0958 85     Resp 07/25/22 0958 20     Temp 07/25/22 0958 98.6 F (37 C)     Temp Source 07/25/22 0958 Oral     SpO2 07/25/22 0958 95 %     Weight --      Height --      Head Circumference --      Peak Flow --      Pain Score 07/25/22 1003 4     Pain Loc --      Pain Edu? --      Excl. in [place]? --    No data found.  Updated Vital Signs BP 126/88 (BP Location: Right Arm)   Pulse 85   Temp 98.6 F (37 C) (Oral)   Resp 20   LMP 07/13/2022 (Approximate)   SpO2 98%   Visual Acuity Right Eye Distance:   Left Eye Distance:   Bilateral Distance:    Right Eye Near:   Left Eye Near:    Bilateral Near:     Physical Exam Vitals and nursing note reviewed.  Constitutional:      General: She is not in acute distress.    Appearance: Normal appearance. She is not ill-appearing or toxic-appearing.  HENT:     Head: Normocephalic and atraumatic.     Right Ear: Tympanic membrane, ear canal and external ear normal.     Left Ear: Tympanic membrane, ear canal and external ear normal.     Nose: Congestion and rhinorrhea present.     Mouth/Throat:     Mouth: Mucous membranes are moist.     Pharynx: Oropharynx is clear. No oropharyngeal exudate or posterior oropharyngeal erythema.  Eyes:     General: No scleral icterus.    Extraocular Movements: Extraocular movements intact.  Cardiovascular:     Rate and  Rhythm: Normal rate and regular rhythm.  Pulmonary:     Effort: Pulmonary effort is normal. No respiratory distress.     Breath sounds: Decreased air movement  present. Wheezing present.  Abdominal:     General: Abdomen is flat. Bowel sounds are normal. There is no distension.     Palpations: Abdomen is soft.     Tenderness: There is no abdominal tenderness. There is no guarding.  Musculoskeletal:     Cervical back: Normal range of motion and neck supple.  Lymphadenopathy:     Cervical: No cervical adenopathy.  Skin:    General: Skin is warm and dry.     Coloration: Skin is not jaundiced or pale.     Findings: No erythema or rash.  Neurological:     Mental Status: She is alert and oriented to person, place, and time.  Psychiatric:        Behavior: Behavior is cooperative.      UC Treatments / Results  Labs (all labs ordered are listed, but only abnormal results are displayed) Labs Reviewed  SARS CORONAVIRUS 2 (TAT 6-24 HRS)    EKG   Radiology No results found.  Procedures Procedures (including critical care time)  Medications Ordered in UC Medications  albuterol (PROVENTIL) (2.5 MG/3ML) 0.083% nebulizer solution 2.5 mg (2.5 mg Nebulization Given 07/25/22 1029)    Initial Impression / Assessment and Plan / UC Course  I have reviewed the triage vital signs and the nursing notes.  Pertinent labs & imaging results that were available during my care of the patient were reviewed by me and considered in my medical decision making (see chart for details).   Patient is well-appearing, normotensive, afebrile, not tachycardic, not tachypneic, oxygenating well on room air.    1. Viral URI with cough 2. Encounter for screening for COVID-19 3. Wheezing Suspect viral etiology COVID-19 test is pending Vital signs and examination today are reassuring Albuterol breathing treatment given with increase in oxygenation and improvement in wheezing Continue albuterol inhaler every  4-6 hours as needed at home for chest tightness, cough, or shortness of breath Start oral prednisone Other supportive care discussed including cough suppressant medication We discussed that antibiotics are not indicated at this time ER and return precautions discussed with patient  The patient was given the opportunity to ask questions.  All questions answered to their satisfaction.  The patient is in agreement to this plan.    Final Clinical Impressions(s) / UC Diagnoses   Final diagnoses:  Viral URI with cough  Encounter for screening for COVID-19  Wheezing     Discharge Instructions      You have a viral upper respiratory infection.  Symptoms should improve over the next week to 10 days.  If you develop chest pain or shortness of breath, go to the emergency room.  We have tested you today for COVID-19.  You will see the results in Mychart and we will call you with positive results.    Please stay home and isolate until you are aware of the results.    Please use the albuterol inhaler every 4-6 hours as needed for chest tightness, wheezing, or cough.  Please start the oral prednisone today.  Some things that can make you feel better are: - Increased rest - Increasing fluid with water/sugar free electrolytes - Acetaminophen and ibuprofen as needed for fever/pain - Salt water gargling, chloraseptic spray and throat lozenges - OTC guaifenesin (Mucinex) 600 mg twice daily - Saline sinus flushes or a neti pot - Humidifying the air -Tessalon Perles during the day as needed for dry cough and cough syrup at nighttime as needed for dry cough  ED Prescriptions     Medication Sig Dispense Auth. Provider   albuterol (VENTOLIN HFA) 108 (90 Base) MCG/ACT inhaler Inhale 1-2 puffs into the lungs every 6 (six) hours as needed for wheezing or shortness of breath. 18 g Cathlean Marseilles A, NP   predniSONE (DELTASONE) 20 MG tablet Take 2 tablets (40 mg total) by mouth daily with breakfast  for 5 days. 10 tablet Cathlean Marseilles A, NP   promethazine-dextromethorphan (PROMETHAZINE-DM) 6.25-15 MG/5ML syrup Take 5 mLs by mouth at bedtime as needed for cough. Do not take with alcohol or while driving or operating heavy machinery.  May cause drowsiness. 118 mL Cathlean Marseilles A, NP   benzonatate (TESSALON) 100 MG capsule Take 1 capsule (100 mg total) by mouth 3 (three) times daily as needed for cough. Do not take with alcohol or while driving or operating heavy machinery.  May cause drowsiness. 21 capsule Valentino Nose, NP      PDMP not reviewed this encounter.   Valentino Nose, NP 07/25/22 1122

## 2022-07-25 NOTE — Discharge Instructions (Signed)
You have a viral upper respiratory infection.  Symptoms should improve over the next week to 10 days.  If you develop chest pain or shortness of breath, go to the emergency room.  We have tested you today for COVID-19.  You will see the results in Mychart and we will call you with positive results.    Please stay home and isolate until you are aware of the results.    Please use the albuterol inhaler every 4-6 hours as needed for chest tightness, wheezing, or cough.  Please start the oral prednisone today.  Some things that can make you feel better are: - Increased rest - Increasing fluid with water/sugar free electrolytes - Acetaminophen and ibuprofen as needed for fever/pain - Salt water gargling, chloraseptic spray and throat lozenges - OTC guaifenesin (Mucinex) 600 mg twice daily - Saline sinus flushes or a neti pot - Humidifying the air -Tessalon Perles during the day as needed for dry cough and cough syrup at nighttime as needed for dry cough

## 2022-07-25 NOTE — ED Triage Notes (Signed)
Pt reports she is coughing, sneezing, hurts to breath in and out, sore throat x 2 days. Took alketzer cold and cough , tylenol/ibupofren and zrytec which gave slight relief.

## 2022-07-26 LAB — SARS CORONAVIRUS 2 (TAT 6-24 HRS): SARS Coronavirus 2: NEGATIVE

## 2022-08-02 ENCOUNTER — Telehealth: Payer: Medicaid Other | Admitting: Physician Assistant

## 2022-08-02 DIAGNOSIS — J329 Chronic sinusitis, unspecified: Secondary | ICD-10-CM

## 2022-08-02 DIAGNOSIS — B9689 Other specified bacterial agents as the cause of diseases classified elsewhere: Secondary | ICD-10-CM

## 2022-08-02 MED ORDER — DOXYCYCLINE HYCLATE 100 MG PO TABS: 100.0000 mg | ORAL_TABLET | Freq: Two times a day (BID) | ORAL | 0 refills | Status: AC

## 2022-08-02 NOTE — Progress Notes (Signed)
E-Visit for Sinus Problems  We are sorry that you are not feeling well.  Here is how we plan to help!  Based on what you have shared with me it looks like you have sinusitis.  Sinusitis is inflammation and infection in the sinus cavities of the head.  Based on your presentation I believe you most likely have Acute Bacterial Sinusitis.  This is an infection caused by bacteria and is treated with antibiotics. I have prescribed Doxycycline  by mouth twice a day for 10 days. You may use an oral decongestant such as Mucinex D or if you have glaucoma or high blood pressure use plain Mucinex. Saline nasal spray help and can safely be used as often as needed for congestion.  If you develop worsening sinus pain, fever or notice severe headache and vision changes, or if symptoms are not better after completion of antibiotic, please schedule an appointment with a health care provider.    Sinus infections are not as easily transmitted as other respiratory infection, however we still recommend that you avoid close contact with loved ones, especially the very young and elderly.  Remember to wash your hands thoroughly throughout the day as this is the number one way to prevent the spread of infection!  The definitive treatment for an abscess if typically drainage and they sometimes do not respond to antibiotics.  Please follow up with your PCP or with Urgent Care for further management if no improvement.  Recommend daily warm compress.   Home Care: Only take medications as instructed by your medical team. Complete the entire course of an antibiotic. Do not take these medications with alcohol. A steam or ultrasonic humidifier can help congestion.  You can place a towel over your head and breathe in the steam from hot water coming from a faucet. Avoid close contacts especially the very young and the elderly. Cover your mouth when you cough or sneeze. Always remember to wash your hands.  Get Help Right Away  If: You develop worsening fever or sinus pain. You develop a severe head ache or visual changes. Your symptoms persist after you have completed your treatment plan.  Make sure you Understand these instructions. Will watch your condition. Will get help right away if you are not doing well or get worse.  Thank you for choosing an e-visit.  Your e-visit answers were reviewed by a board certified advanced clinical practitioner to complete your personal care plan. Depending upon the condition, your plan could have included both over the counter or prescription medications.  Please review your pharmacy choice. Make sure the pharmacy is open so you can pick up prescription now. If there is a problem, you may contact your provider through [employer] and have the prescription routed to another pharmacy.  Your safety is important to [place]. If you have drug allergies check your prescription carefully.   For the next 24 hours you can use MyChart to ask questions about today's visit, request a non-urgent call back, or ask for a work or school excuse. You will get an email in the next two days asking about your experience. I hope that your e-visit has been valuable and will speed your recovery.   I have spent 5 minutes in review of e-visit questionnaire, review and updating patient chart, medical decision making and response to patient.   Tylene Fantasia Ward, PA-C

## 2022-09-06 ENCOUNTER — Ambulatory Visit: Payer: No Typology Code available for payment source | Admitting: Family Medicine

## 2022-09-13 ENCOUNTER — Ambulatory Visit: Payer: Medicaid Other | Admitting: Family Medicine

## 2022-09-13 ENCOUNTER — Encounter: Payer: Self-pay | Admitting: Family Medicine

## 2022-09-13 VITALS — BP 121/88 | HR 73 | Temp 97.8°F | Resp 20 | Ht 66.0 in | Wt 252.0 lb

## 2022-09-13 DIAGNOSIS — F603 Borderline personality disorder: Secondary | ICD-10-CM | POA: Diagnosis not present

## 2022-09-13 DIAGNOSIS — E559 Vitamin D deficiency, unspecified: Secondary | ICD-10-CM

## 2022-09-13 DIAGNOSIS — F3132 Bipolar disorder, current episode depressed, moderate: Secondary | ICD-10-CM | POA: Diagnosis not present

## 2022-09-13 DIAGNOSIS — E782 Mixed hyperlipidemia: Secondary | ICD-10-CM

## 2022-09-13 LAB — CMP14+EGFR
ALT: 31 IU/L (ref 0–32)
AST: 23 IU/L (ref 0–40)
Alkaline Phosphatase: 75 IU/L (ref 44–121)
BUN/Creatinine Ratio: 9 (ref 9–23)
BUN: 7 mg/dL (ref 6–20)
Calcium: 9.7 mg/dL (ref 8.7–10.2)
Globulin, Total: 2.1 g/dL (ref 1.5–4.5)
Potassium: 4.5 mmol/L (ref 3.5–5.2)
Total Protein: 6.5 g/dL (ref 6.0–8.5)

## 2022-09-13 LAB — LIPID PANEL
Chol/HDL Ratio: 5.5 ratio — ABNORMAL HIGH (ref 0.0–4.4)
Cholesterol, Total: 257 mg/dL — ABNORMAL HIGH (ref 100–199)

## 2022-09-13 LAB — CBC WITH DIFFERENTIAL/PLATELET
Basos: 1 %
EOS (ABSOLUTE): 0.1 10*3/uL (ref 0.0–0.4)
Eos: 2 %
MCH: 30.8 pg (ref 26.6–33.0)
MCV: 91 fL (ref 79–97)
Neutrophils: 45 %
Platelets: 308 10*3/uL (ref 150–450)
RBC: 4 x10E6/uL (ref 3.77–5.28)
RDW: 12.7 % (ref 11.7–15.4)

## 2022-09-13 LAB — VITAMIN D 25 HYDROXY (VIT D DEFICIENCY, FRACTURES)

## 2022-09-13 LAB — BAYER DCA HB A1C WAIVED: HB A1C (BAYER DCA - WAIVED): 5.1 % (ref 4.8–5.6)

## 2022-09-13 NOTE — Progress Notes (Signed)
Subjective:  Patient ID: [name], female    DOB: 11/15/1985, 36 y.o.   MRN: [id number]  Patient Care Team: Sonny Masters, FNP as PCP - General (Family Medicine)   Chief Complaint:  Discuss meds for bipolar   HPI: [name] is a 37 y.o. female presenting on 09/13/2022 for Discuss meds for bipolar   Pt presents today to establish care with new PCP and to discuss getting back on medications for bipolar disorder. States she lost her insurance and has not been on medications in over 5 months. Feels her depressive and anxiety symptoms have increased over the last few months due to family stressors. Denies SI or HI but does feel very depressed.  She does have obesity and has had elevated cholesterol in the past. She does not follow a diet or exercise routine.  She also reports weekly headaches. Bandlike wrapping from one temple to the other. Nausea and photophobia with the pain. States the headaches will last 2-4 days at times. Tylenol and motrin are minimally beneficial.      09/13/2022    9:46 AM 09/24/2021   11:20 AM 05/28/2021    1:17 PM  GAD 7 : Generalized Anxiety Score  Nervous, Anxious, on Edge 3 2 3   Control/stop worrying 3 2 3   Worry too much - different things 3 2 2   Trouble relaxing 3 2 3   Restless 3 1 1   Easily annoyed or irritable 3 3 3   Afraid - awful might happen 2 0 1  Total GAD 7 Score 20 12 16   Anxiety Difficulty  Somewhat difficult Very difficult       09/13/2022    9:46 AM 09/24/2021   11:20 AM 05/28/2021    1:16 PM  Depression screen PHQ 2/9  Decreased Interest 3 2 2   Down, Depressed, Hopeless 3 2 2   PHQ - 2 Score 6 4 4   Altered sleeping 3 3 2   Tired, decreased energy 3 3 3   Change in appetite 3 1 1   Feeling bad or failure about yourself  3 3 2   Trouble concentrating 3 1 2   Moving slowly or fidgety/restless 3 0 1  Suicidal thoughts 0 0 1  PHQ-9 Score 24 15 16   Difficult doing work/chores  Somewhat difficult Very difficult        Relevant  past medical, surgical, family, and social history reviewed and updated as indicated.  Allergies and medications reviewed and updated. Data reviewed: Chart in Epic.   Past Medical History:  Diagnosis Date   Borderline personality disorder (HCC)    Hyperlipidemia 09/25/2021   Hypertension in pregnancy    Knee pain    Nephrolithiasis    Postpartum depression    Preeclampsia     Past Surgical History:  Procedure Laterality Date   APPENDECTOMY     CESAREAN SECTION     CYSTOSCOPY/URETEROSCOPY/HOLMIUM LASER/STENT PLACEMENT Right 04/16/2021   Procedure: CYSTOSCOPY RIGHT RETROGRADE PYELOGRAM  URETEROSCOPY/HOLMIUM LASER/STENT PLACEMENT;  Surgeon: Bjorn Pippin, MD;  Location: [facility];  Service: Urology;  Laterality: Right;   HOLMIUM LASER APPLICATION Right 04/16/2021   Procedure: HOLMIUM LASER APPLICATION;  Surgeon: Bjorn Pippin, MD;  Location: [facility];  Service: Urology;  Laterality: Right;   KIDNEY STONE SURGERY  04/16/2021   r-kidney   KNEE SURGERY Right    scoped x2   WISDOM TOOTH EXTRACTION  2010    Social History   Socioeconomic History   Marital status: Divorced  Spouse name: Not on file   Number of children: Not on file   Years of education: Not on file   Highest education level: Some college, no degree  Occupational History   Not on file  Tobacco Use   Smoking status: Never   Smokeless tobacco: Never  Vaping Use   Vaping Use: Never used  Substance and Sexual Activity   Alcohol use: No   Drug use: Not Currently    Types: Marijuana    Comment: last used 02/20/13   Sexual activity: Yes    Birth control/protection: None  Other Topics Concern   Not on file  Social History Narrative   Not on file   Social Determinants of Health   Financial Resource Strain: Low Risk  (09/02/2022)   Overall Financial Resource Strain (CARDIA)    Difficulty of Paying Living Expenses: Not very hard  Food Insecurity: No Food Insecurity (09/02/2022)    Hunger Vital Sign    Worried About Running Out of Food in the Last Year: Never true    Ran Out of Food in the Last Year: Never true  Transportation Needs: No Transportation Needs (09/02/2022)   PRAPARE - Administrator, Civil Service (Medical): No    Lack of Transportation (Non-Medical): No  Physical Activity: Sufficiently Active (09/02/2022)   Exercise Vital Sign    Days of Exercise per Week: 5 days    Minutes of Exercise per Session: 60 min  Stress: Stress Concern Present (09/02/2022)   [employer] of Occupational Health - Occupational Stress Questionnaire    Feeling of Stress : Very much  Social Connections: Moderately Isolated (09/02/2022)   Social Connection and Isolation Panel [NHANES]    Frequency of Communication with Friends and Family: More than three times a week    Frequency of Social Gatherings with Friends and Family: Once a week    Attends Religious Services: Never    Database administrator or Organizations: No    Attends Engineer, structural: Not on file    Marital Status: Living with partner  Intimate Partner Violence: Not on file    Outpatient Encounter Medications as of 09/13/2022  Medication Sig   albuterol (VENTOLIN HFA) 108 (90 Base) MCG/ACT inhaler Inhale 1-2 puffs into the lungs every 6 (six) hours as needed for wheezing or shortness of breath.   [DISCONTINUED] benzonatate (TESSALON) 100 MG capsule Take 1 capsule (100 mg total) by mouth 3 (three) times daily as needed for cough. Do not take with alcohol or while driving or operating heavy machinery.  May cause drowsiness.   [DISCONTINUED] naproxen (NAPROSYN) 500 MG tablet TAKE 1 TABLET BY MOUTH 2 TIMES DAILY WITH A MEAL.   [DISCONTINUED] promethazine-dextromethorphan (PROMETHAZINE-DM) 6.25-15 MG/5ML syrup Take 5 mLs by mouth at bedtime as needed for cough. Do not take with alcohol or while driving or operating heavy machinery.  May cause drowsiness.   [DISCONTINUED] rosuvastatin (CRESTOR)  5 MG tablet Take 1 tablet (5 mg total) by mouth daily.   [DISCONTINUED] Semaglutide-Weight Management (WEGOVY) 0.25 MG/0.5ML SOAJ Inject 0.25 mg into the skin once a week.   No facility-administered encounter medications on file as of 09/13/2022.    Allergies  Allergen Reactions   Sodium Hypochlorite Shortness Of Breath   Keflex [Cephalexin]    Latex    Skin Bleaching [Hydroquinone]     Facial swelling, SOB    Review of Systems  Constitutional:  Positive for activity change, appetite change and fatigue. Negative for chills, diaphoresis,  fever and unexpected weight change.  HENT: Negative.    Eyes:  Positive for photophobia. Negative for visual disturbance.  Respiratory:  Negative for cough, chest tightness and shortness of breath.   Cardiovascular:  Negative for chest pain, palpitations and leg swelling.  Gastrointestinal:  Positive for nausea. Negative for abdominal distention, abdominal pain, anal bleeding, blood in stool, constipation, diarrhea, rectal pain and vomiting.  Endocrine: Negative.  Negative for polydipsia, polyphagia and polyuria.  Genitourinary:  Negative for decreased urine volume, difficulty urinating, dysuria, frequency and urgency.  Musculoskeletal:  Negative for arthralgias and myalgias.  Skin: Negative.   Allergic/Immunologic: Negative.   Neurological:  Positive for headaches. Negative for dizziness, tremors, seizures, syncope, facial asymmetry, speech difficulty, weakness, light-headedness and numbness.  Hematological: Negative.   Psychiatric/Behavioral:  Positive for agitation, behavioral problems, decreased concentration, dysphoric mood and sleep disturbance. Negative for confusion, hallucinations, self-injury and suicidal ideas. The patient is nervous/anxious. The patient is not hyperactive.   All other systems reviewed and are negative.       Objective:  BP 121/88   Pulse 73   Temp 97.8 F (36.6 C) (Temporal)   Resp 20   Ht 5\' 6"  (1.676 m)   Wt 252  lb (114.3 kg)   SpO2 98%   BMI 40.67 kg/m    Wt Readings from Last 3 Encounters:  09/13/22 252 lb (114.3 kg)  09/24/21 249 lb 9.6 oz (113.2 kg)  05/28/21 254 lb 6.4 oz (115.4 kg)    Physical Exam Vitals and nursing note reviewed.  Constitutional:      General: She is not in acute distress.    Appearance: Normal appearance. She is well-developed and well-groomed. She is obese. She is not ill-appearing, toxic-appearing or diaphoretic.  HENT:     Head: Normocephalic and atraumatic.     Jaw: There is normal jaw occlusion.     Right Ear: Hearing normal.     Left Ear: Hearing normal.     Nose: Nose normal.     Mouth/Throat:     Lips: Pink.     Mouth: Mucous membranes are moist.     Pharynx: Oropharynx is clear. Uvula midline.  Eyes:     General: Lids are normal.     Extraocular Movements: Extraocular movements intact.     Conjunctiva/sclera: Conjunctivae normal.     Pupils: Pupils are equal, round, and reactive to light.  Neck:     Thyroid: No thyroid mass, thyromegaly or thyroid tenderness.     Vascular: No carotid bruit or JVD.     Trachea: Trachea and phonation normal.  Cardiovascular:     Rate and Rhythm: Normal rate and regular rhythm.     Chest Wall: PMI is not displaced.     Pulses: Normal pulses.     Heart sounds: Normal heart sounds. No murmur heard.    No friction rub. No gallop.  Pulmonary:     Effort: Pulmonary effort is normal. No respiratory distress.     Breath sounds: Normal breath sounds. No wheezing.  Musculoskeletal:        General: Normal range of motion.     Cervical back: Normal range of motion and neck supple.     Right lower leg: No edema.     Left lower leg: No edema.  Lymphadenopathy:     Cervical: No cervical adenopathy.  Skin:    General: Skin is warm and dry.     Capillary Refill: Capillary refill takes less than 2 seconds.  Coloration: Skin is not cyanotic, jaundiced or pale.     Findings: No rash.  Neurological:     General: No  focal deficit present.     Mental Status: She is alert and oriented to person, place, and time.     Sensory: Sensation is intact.     Motor: Motor function is intact.     Coordination: Coordination is intact.     Gait: Gait is intact.     Deep Tendon Reflexes: Reflexes are normal and symmetric.  Psychiatric:        Attention and Perception: Attention and perception normal.        Mood and Affect: Mood and affect normal.        Speech: Speech normal.        Behavior: Behavior normal. Behavior is cooperative.        Thought Content: Thought content normal.        Cognition and Memory: Cognition and memory normal.        Judgment: Judgment normal.     Results for orders placed or performed during the hospital encounter of 07/25/22  SARS CORONAVIRUS 2 (TAT 6-24 HRS) Anterior Nasal Swab   Specimen: Anterior Nasal Swab  Result Value Ref Range   SARS Coronavirus 2 NEGATIVE NEGATIVE       Pertinent labs & imaging results that were available during my care of the patient were reviewed by me and considered in my medical decision making.  Assessment & Plan:  Kaile was seen today for discuss meds for bipolar.  Diagnoses and all orders for this visit:  Bipolar affective disorder, currently depressed, moderate (HCC) Borderline personality disorder (HCC) No SI or HI. Has not seen psychiatry in several months, urgent referral placed. Crisis intervention hotline numbers provided. Labs pending.  -     Thyroid Panel With TSH -     Ambulatory referral to Psychiatry -     VITAMIN D 25 Hydroxy (Vit-D Deficiency, Fractures)  Morbid obesity (HCC) Diet encouraged - increase intake of fresh fruits and vegetables, increase intake of lean proteins. Bake, broil, or grill foods. Avoid fried, greasy, and fatty foods. Avoid fast foods. Increase intake of fiber-rich whole grains. Exercise encouraged - at least 150 minutes per week and advance as tolerated. Goal BMI < 25. -     CBC with  Differential/Platelet -     CMP14+EGFR -     Thyroid Panel With TSH -     Lipid panel -     VITAMIN D 25 Hydroxy (Vit-D Deficiency, Fractures) -     Bayer DCA Hb A1c Waived  Mixed hyperlipidemia Diet encouraged - increase intake of fresh fruits and vegetables, increase intake of lean proteins. Bake, broil, or grill foods. Avoid fried, greasy, and fatty foods. Avoid fast foods. Increase intake of fiber-rich whole grains. Exercise encouraged - at least 150 minutes per week and advance as tolerated.  Goal BMI < 25. Continue medications as prescribed. Follow up in 3-6 months as discussed.  -     CBC with Differential/Platelet -     CMP14+EGFR -     Thyroid Panel With TSH -     Lipid panel -     VITAMIN D 25 Hydroxy (Vit-D Deficiency, Fractures)  Weekly headache Symptomatic care discussed in detail. Aware to avoid NSAIDs. Headache diary provided. Follow up in 6 weeks for reevaluation, will refer if warranted.   Continue all other maintenance medications.  Follow up plan: Return in about 6 weeks (around  10/25/2022), or if symptoms worsen or fail to improve, for headaches.   Continue healthy lifestyle choices, including diet (rich in fruits, vegetables, and lean proteins, and low in salt and simple carbohydrates) and exercise (at least 30 minutes of moderate physical activity daily).  Educational handout given for headache record Crisis intervention hotline numbers  The above assessment and management plan was discussed with the patient. The patient verbalized understanding of and has agreed to the management plan. Patient is aware to call the clinic if they develop any new symptoms or if symptoms persist or worsen. Patient is aware when to return to the clinic for a follow-up visit. Patient educated on when it is appropriate to go to the emergency department.   Kari Baars, FNP-C [facility] [phone]

## 2022-09-13 NOTE — Patient Instructions (Signed)

## 2022-09-14 ENCOUNTER — Encounter: Payer: Self-pay | Admitting: Family Medicine

## 2022-09-14 DIAGNOSIS — E559 Vitamin D deficiency, unspecified: Secondary | ICD-10-CM | POA: Insufficient documentation

## 2022-09-14 LAB — LIPID PANEL
HDL: 47 mg/dL (ref >39)
LDL Chol Calc (NIH): 179 mg/dL — ABNORMAL HIGH (ref 0–99)
Triglycerides: 169 mg/dL — ABNORMAL HIGH (ref 0–149)
VLDL Cholesterol Cal: 31 mg/dL (ref 5–40)

## 2022-09-14 LAB — CMP14+EGFR
Albumin/Globulin Ratio: 2.1 (ref 1.2–2.2)
Albumin: 4.4 g/dL (ref 3.9–4.9)
Bilirubin Total: 0.3 mg/dL (ref 0.0–1.2)
CO2: 21 mmol/L (ref 20–29)
Chloride: 104 mmol/L (ref 96–106)
Creatinine, Ser: 0.76 mg/dL (ref 0.57–1.00)
Glucose: 89 mg/dL (ref 70–99)
Sodium: 137 mmol/L (ref 134–144)
eGFR: 105 mL/min/{1.73_m2} (ref >59)

## 2022-09-14 LAB — CBC WITH DIFFERENTIAL/PLATELET
Basophils Absolute: 0.1 10*3/uL (ref 0.0–0.2)
Hematocrit: 36.4 % (ref 34.0–46.6)
Hemoglobin: 12.3 g/dL (ref 11.1–15.9)
Immature Grans (Abs): 0 10*3/uL (ref 0.0–0.1)
Immature Granulocytes: 0 %
Lymphocytes Absolute: 1.8 10*3/uL (ref 0.7–3.1)
Lymphs: 45 %
MCHC: 33.8 g/dL (ref 31.5–35.7)
Monocytes Absolute: 0.3 10*3/uL (ref 0.1–0.9)
Monocytes: 7 %
Neutrophils Absolute: 1.8 10*3/uL (ref 1.4–7.0)
WBC: 4 10*3/uL (ref 3.4–10.8)

## 2022-09-14 LAB — THYROID PANEL WITH TSH
Free Thyroxine Index: 2.4 (ref 1.2–4.9)
T3 Uptake Ratio: 23 % — ABNORMAL LOW (ref 24–39)
T4, Total: 10.4 ug/dL (ref 4.5–12.0)
TSH: 1.26 u[IU]/mL (ref 0.450–4.500)

## 2022-09-14 MED ORDER — VITAMIN D (ERGOCALCIFEROL) 1.25 MG (50000 UNIT) PO CAPS
50000.0000 [IU] | ORAL_CAPSULE | ORAL | 3 refills | Status: DC
Start: 2022-09-14 — End: 2023-02-06

## 2022-09-14 MED ORDER — ROSUVASTATIN CALCIUM 10 MG PO TABS
10.0000 mg | ORAL_TABLET | Freq: Every day | ORAL | 3 refills | Status: DC
Start: 2022-09-14 — End: 2023-01-20

## 2022-09-14 NOTE — Addendum Note (Signed)
Addended by: Sonny Masters on: 09/14/2022 10:00 AM   Modules accepted: Orders

## 2022-09-14 NOTE — Addendum Note (Signed)
Addended by: Sonny Masters on: 09/14/2022 11:25 AM   Modules accepted: Orders

## 2022-09-26 ENCOUNTER — Telehealth: Payer: Medicaid Other | Admitting: Physician Assistant

## 2022-09-26 DIAGNOSIS — L740 Miliaria rubra: Secondary | ICD-10-CM | POA: Diagnosis not present

## 2022-09-26 DIAGNOSIS — B372 Candidiasis of skin and nail: Secondary | ICD-10-CM | POA: Diagnosis not present

## 2022-09-26 DIAGNOSIS — L732 Hidradenitis suppurativa: Secondary | ICD-10-CM

## 2022-09-26 MED ORDER — DOXYCYCLINE HYCLATE 100 MG PO TABS
100.0000 mg | ORAL_TABLET | Freq: Two times a day (BID) | ORAL | 0 refills | Status: DC
Start: 2022-09-26 — End: 2022-10-05

## 2022-09-26 MED ORDER — NYSTATIN 100000 UNIT/GM EX CREA
1.0000 | TOPICAL_CREAM | Freq: Two times a day (BID) | CUTANEOUS | 0 refills | Status: DC
Start: 2022-09-26 — End: 2023-05-05

## 2022-09-26 NOTE — Progress Notes (Signed)
E Visit for Rash  We are sorry that you are not feeling well. Here is how we plan to help!  It seems you have a few things going on. The first and milder, is a likely yeast rash underneath the skin of your stomach. For this you want to keep the skin clean and dry. I have prescribed a prescription cream to apply as directed to help this resolve. The second, seems to be most likely a heat rash on the thigh. This does not seem to be bacterial or fungal. Keep the skin clean and dry. You can apply OTC hydrocortisone to the area. Avoid direct sunlight when possible. If not quickly resolving, you need an in-person evaluation.  The last thing, regarding the multiple boils, seems to be a condition we refer to as hidradenitis suppurativa and warrants you to be evaluated in person with your PCP for some ongoing management and possible referral to dermatology. Some of these areas may have active infection instead of just inflammation, so I am starting you on an oral antibiotic to take as directed until your appointment with your primary care provider.    HOME CARE:  Take cool showers and avoid direct sunlight. Apply cool compress or wet dressings. Take a bath in an oatmeal bath.  Sprinkle content of one Aveeno packet under running faucet with comfortably warm water.  Bathe for 15-20 minutes, 1-2 times daily.  Pat dry with a towel. Do not rub the rash. Use hydrocortisone cream. Take an antihistamine like Benadryl for widespread rashes that itch.  The adult dose of Benadryl is 25-50 mg by mouth 4 times daily. Caution:  This type of medication may cause sleepiness.  Do not drink alcohol, drive, or operate dangerous machinery while taking antihistamines.  Do not take these medications if you have prostate enlargement.  Read package instructions thoroughly on all medications that you take.  GET HELP RIGHT AWAY IF:  Symptoms don't go away after treatment. Severe itching that persists. If you rash spreads or  swells. If you rash begins to smell. If it blisters and opens or develops a yellow-brown crust. You develop a fever. You have a sore throat. You become short of breath.  MAKE SURE YOU:  Understand these instructions. Will watch your condition. Will get help right away if you are not doing well or get worse.  Thank you for choosing an e-visit.  Your e-visit answers were reviewed by a board certified advanced clinical practitioner to complete your personal care plan. Depending upon the condition, your plan could have included both over the counter or prescription medications.  Please review your pharmacy choice. Make sure the pharmacy is open so you can pick up prescription now. If there is a problem, you may contact your provider through [employer] and have the prescription routed to another pharmacy.  Your safety is important to [place]. If you have drug allergies check your prescription carefully.   For the next 24 hours you can use MyChart to ask questions about today's visit, request a non-urgent call back, or ask for a work or school excuse. You will get an email in the next two days asking about your experience. I hope that your e-visit has been valuable and will speed your recovery.

## 2022-09-26 NOTE — Progress Notes (Signed)
I have spent 5 minutes in review of e-visit questionnaire, review and updating patient chart, medical decision making and response to patient.   F[place] Cassadi Purdie, PA-C    

## 2022-09-27 ENCOUNTER — Encounter: Payer: No Typology Code available for payment source | Admitting: Family Medicine

## 2022-09-27 ENCOUNTER — Encounter: Payer: No Typology Code available for payment source | Admitting: Nurse Practitioner

## 2022-09-30 DIAGNOSIS — H52223 Regular astigmatism, bilateral: Secondary | ICD-10-CM | POA: Diagnosis not present

## 2022-09-30 DIAGNOSIS — H5213 Myopia, bilateral: Secondary | ICD-10-CM | POA: Diagnosis not present

## 2022-10-05 ENCOUNTER — Ambulatory Visit: Payer: Medicaid Other | Admitting: Family Medicine

## 2022-10-05 ENCOUNTER — Encounter: Payer: Self-pay | Admitting: Family Medicine

## 2022-10-05 VITALS — BP 116/75 | HR 77 | Temp 97.9°F | Ht 66.0 in | Wt 252.2 lb

## 2022-10-05 DIAGNOSIS — L732 Hidradenitis suppurativa: Secondary | ICD-10-CM | POA: Insufficient documentation

## 2022-10-05 NOTE — Progress Notes (Signed)
Subjective:  Patient ID: [name], female    DOB: Mar 30, 1986, 37 y.o.   MRN: [id number]  Patient Care Team: Sonny Masters, FNP as PCP - General (Family Medicine)   Chief Complaint:  Follow-up (09/26/2022/[facility] - [facility])   HPI: [name] is a 37 y.o. female presenting on 10/05/2022 for Follow-up (09/26/2022/[facility])   1. Hidradenitis suppurativa Had an E-Visit and was placed on keflex. She has completed the antibiotics and reports the lesions have decreased in size and number. No erythema or drainage. Has not seen Dermatology.      Relevant past medical, surgical, family, and social history reviewed and updated as indicated.  Allergies and medications reviewed and updated. Data reviewed: Chart in Epic.   Past Medical History:  Diagnosis Date   Borderline personality disorder (HCC)    Hyperlipidemia 09/25/2021   Hypertension in pregnancy    Knee pain    Nephrolithiasis    Postpartum depression    Preeclampsia     Past Surgical History:  Procedure Laterality Date   APPENDECTOMY     CESAREAN SECTION     CYSTOSCOPY/URETEROSCOPY/HOLMIUM LASER/STENT PLACEMENT Right 04/16/2021   Procedure: CYSTOSCOPY RIGHT RETROGRADE PYELOGRAM  URETEROSCOPY/HOLMIUM LASER/STENT PLACEMENT;  Surgeon: Bjorn Pippin, MD;  Location: [facility];  Service: Urology;  Laterality: Right;   HOLMIUM LASER APPLICATION Right 04/16/2021   Procedure: HOLMIUM LASER APPLICATION;  Surgeon: Bjorn Pippin, MD;  Location: [facility];  Service: Urology;  Laterality: Right;   KIDNEY STONE SURGERY  04/16/2021   r-kidney   KNEE SURGERY Right    scoped x2   WISDOM TOOTH EXTRACTION  2010    Social History   Socioeconomic History   Marital status: Divorced    Spouse name: Not on file   Number of children: Not on file   Years of education: Not on file   Highest education level: Some college, no degree  Occupational History   Not on file   Tobacco Use   Smoking status: Never   Smokeless tobacco: Never  Vaping Use   Vaping Use: Never used  Substance and Sexual Activity   Alcohol use: No   Drug use: Not Currently    Types: Marijuana    Comment: last used 02/20/13   Sexual activity: Yes    Birth control/protection: None  Other Topics Concern   Not on file  Social History Narrative   Not on file   Social Determinants of Health   Financial Resource Strain: Low Risk  (09/02/2022)   Overall Financial Resource Strain (CARDIA)    Difficulty of Paying Living Expenses: Not very hard  Food Insecurity: No Food Insecurity (09/02/2022)   Hunger Vital Sign    Worried About Running Out of Food in the Last Year: Never true    Ran Out of Food in the Last Year: Never true  Transportation Needs: No Transportation Needs (09/02/2022)   PRAPARE - Administrator, Civil Service (Medical): No    Lack of Transportation (Non-Medical): No  Physical Activity: Sufficiently Active (09/02/2022)   Exercise Vital Sign    Days of Exercise per Week: 5 days    Minutes of Exercise per Session: 60 min  Stress: Stress Concern Present (09/02/2022)   [employer] of Occupational Health - Occupational Stress Questionnaire    Feeling of Stress : Very much  Social Connections: Moderately Isolated (09/02/2022)   Social Connection and Isolation Panel [NHANES]  Frequency of Communication with Friends and Family: More than three times a week    Frequency of Social Gatherings with Friends and Family: Once a week    Attends Religious Services: Never    Database administrator or Organizations: No    Attends Engineer, structural: Not on file    Marital Status: Living with partner  Intimate Partner Violence: Not on file    Outpatient Encounter Medications as of 10/05/2022  Medication Sig   albuterol (VENTOLIN HFA) 108 (90 Base) MCG/ACT inhaler Inhale 1-2 puffs into the lungs every 6 (six) hours as needed for wheezing or shortness of  breath.   nystatin cream (MYCOSTATIN) Apply 1 Application topically 2 (two) times daily.   rosuvastatin (CRESTOR) 10 MG tablet Take 1 tablet (10 mg total) by mouth daily.   Vitamin D, Ergocalciferol, (DRISDOL) 1.25 MG (50000 UNIT) CAPS capsule Take 1 capsule (50,000 Units total) by mouth every 7 (seven) days.   [DISCONTINUED] doxycycline (VIBRA-TABS) 100 MG tablet Take 1 tablet (100 mg total) by mouth 2 (two) times daily.   No facility-administered encounter medications on file as of 10/05/2022.    Allergies  Allergen Reactions   Sodium Hypochlorite Shortness Of Breath   Keflex [Cephalexin]    Latex    Skin Bleaching [Hydroquinone]     Facial swelling, SOB    Review of Systems  Constitutional:  Negative for activity change, appetite change, chills, diaphoresis, fatigue, fever and unexpected weight change.  HENT: Negative.    Eyes: Negative.  Negative for photophobia and visual disturbance.  Respiratory:  Negative for cough, chest tightness and shortness of breath.   Cardiovascular:  Negative for chest pain, palpitations and leg swelling.  Gastrointestinal:  Negative for abdominal pain, blood in stool, constipation, diarrhea, nausea and vomiting.  Endocrine: Negative.  Negative for polydipsia, polyphagia and polyuria.  Genitourinary:  Negative for decreased urine volume, difficulty urinating, dysuria, frequency and urgency.  Musculoskeletal:  Negative for arthralgias and myalgias.  Skin:  Positive for color change and wound.  Allergic/Immunologic: Negative.   Neurological:  Negative for dizziness, tremors, seizures, syncope, facial asymmetry, speech difficulty, weakness, light-headedness, numbness and headaches.  Hematological: Negative.   Psychiatric/Behavioral:  Negative for confusion, hallucinations, sleep disturbance and suicidal ideas.   All other systems reviewed and are negative.       Objective:  BP 116/75   Pulse 77   Temp 97.9 F (36.6 C) (Temporal)   Ht 5\' 6"   (1.676 m)   Wt 252 lb 3.2 oz (114.4 kg)   LMP 09/04/2022   SpO2 97%   BMI 40.71 kg/m    Wt Readings from Last 3 Encounters:  10/05/22 252 lb 3.2 oz (114.4 kg)  09/13/22 252 lb (114.3 kg)  09/24/21 249 lb 9.6 oz (113.2 kg)    Physical Exam Vitals and nursing note reviewed.  Constitutional:      General: She is not in acute distress.    Appearance: Normal appearance. She is obese. She is not ill-appearing, toxic-appearing or diaphoretic.  HENT:     Head: Normocephalic and atraumatic.     Mouth/Throat:     Mouth: Mucous membranes are moist.  Eyes:     Pupils: Pupils are equal, round, and reactive to light.  Cardiovascular:     Rate and Rhythm: Normal rate and regular rhythm.     Heart sounds: Normal heart sounds.  Pulmonary:     Effort: Pulmonary effort is normal.     Breath sounds: Normal breath sounds.  Musculoskeletal:     Cervical back: Neck supple.     Right lower leg: No edema.     Left lower leg: No edema.  Skin:    General: Skin is warm and dry.     Capillary Refill: Capillary refill takes less than 2 seconds.     Comments: PIH in bilateral axilla from previous HS lesions. No erythema or drainage.   Neurological:     General: No focal deficit present.     Mental Status: She is alert and oriented to person, place, and time.  Psychiatric:        Mood and Affect: Mood normal.        Behavior: Behavior normal.        Thought Content: Thought content normal.        Judgment: Judgment normal.     Results for orders placed or performed in visit on 09/13/22  CBC with Differential/Platelet  Result Value Ref Range   WBC 4.0 3.4 - 10.8 x10E3/uL   RBC 4.00 3.77 - 5.28 x10E6/uL   Hemoglobin 12.3 11.1 - 15.9 g/dL   Hematocrit 46.9 62.9 - 46.6 %   MCV 91 79 - 97 fL   MCH 30.8 26.6 - 33.0 pg   MCHC 33.8 31.5 - 35.7 g/dL   RDW 52.8 41.3 - 24.4 %   Platelets 308 150 - 450 x10E3/uL   Neutrophils 45 Not Estab. %   Lymphs 45 Not Estab. %   Monocytes 7 Not Estab. %    Eos 2 Not Estab. %   Basos 1 Not Estab. %   Neutrophils Absolute 1.8 1.4 - 7.0 x10E3/uL   Lymphocytes Absolute 1.8 0.7 - 3.1 x10E3/uL   Monocytes Absolute 0.3 0.1 - 0.9 x10E3/uL   EOS (ABSOLUTE) 0.1 0.0 - 0.4 x10E3/uL   Basophils Absolute 0.1 0.0 - 0.2 x10E3/uL   Immature Granulocytes 0 Not Estab. %   Immature Grans (Abs) 0.0 0.0 - 0.1 x10E3/uL  CMP14+EGFR  Result Value Ref Range   Glucose 89 70 - 99 mg/dL   BUN 7 6 - 20 mg/dL   Creatinine, Ser 0.10 0.57 - 1.00 mg/dL   eGFR 272 >53 GU/YQI/3.47   BUN/Creatinine Ratio 9 9 - 23   Sodium 137 134 - 144 mmol/L   Potassium 4.5 3.5 - 5.2 mmol/L   Chloride 104 96 - 106 mmol/L   CO2 21 20 - 29 mmol/L   Calcium 9.7 8.7 - 10.2 mg/dL   Total Protein 6.5 6.0 - 8.5 g/dL   Albumin 4.4 3.9 - 4.9 g/dL   Globulin, Total 2.1 1.5 - 4.5 g/dL   Albumin/Globulin Ratio 2.1 1.2 - 2.2   Bilirubin Total 0.3 0.0 - 1.2 mg/dL   Alkaline Phosphatase 75 44 - 121 IU/L   AST 23 0 - 40 IU/L   ALT 31 0 - 32 IU/L  Thyroid Panel With TSH  Result Value Ref Range   TSH 1.260 0.450 - 4.500 uIU/mL   T4, Total 10.4 4.5 - 12.0 ug/dL   T3 Uptake Ratio 23 (L) 24 - 39 %   Free Thyroxine Index 2.4 1.2 - 4.9  Lipid panel  Result Value Ref Range   Cholesterol, Total 257 (H) 100 - 199 mg/dL   Triglycerides 425 (H) 0 - 149 mg/dL   HDL 47 >95 mg/dL   VLDL Cholesterol Cal 31 5 - 40 mg/dL   LDL Chol Calc (NIH) 638 (H) 0 - 99 mg/dL   Chol/HDL Ratio 5.5 (H) 0.0 -  4.4 ratio  VITAMIN D 25 Hydroxy (Vit-D Deficiency, Fractures)  Result Value Ref Range   Vit D, 25-Hydroxy 9.4 (L) 30.0 - 100.0 ng/mL  Bayer DCA Hb A1c Waived  Result Value Ref Range   HB A1C (BAYER DCA - WAIVED) 5.1 4.8 - 5.6 %       Pertinent labs & imaging results that were available during my care of the patient were reviewed by me and considered in my medical decision making.  Assessment & Plan:  Byrd was seen today for follow-up.  Diagnoses and all orders for this visit:  Hidradenitis  suppurativa Improved greatly. No indications of active infection. Will refer to dermatology.  -     Ambulatory referral to Dermatology     Continue all other maintenance medications.  Follow up plan: Return in about 6 months (around 04/06/2023), or if symptoms worsen or fail to improve, for CPE.   Continue healthy lifestyle choices, including diet (rich in fruits, vegetables, and lean proteins, and low in salt and simple carbohydrates) and exercise (at least 30 minutes of moderate physical activity daily).  Educational handout given for HS  The above assessment and management plan was discussed with the patient. The patient verbalized understanding of and has agreed to the management plan. Patient is aware to call the clinic if they develop any new symptoms or if symptoms persist or worsen. Patient is aware when to return to the clinic for a follow-up visit. Patient educated on when it is appropriate to go to the emergency department.   Kari Baars, FNP-C [facility] [phone]

## 2022-10-11 ENCOUNTER — Telehealth: Payer: Medicaid Other | Admitting: Physician Assistant

## 2022-10-11 DIAGNOSIS — G43909 Migraine, unspecified, not intractable, without status migrainosus: Secondary | ICD-10-CM | POA: Diagnosis not present

## 2022-10-11 DIAGNOSIS — H5213 Myopia, bilateral: Secondary | ICD-10-CM | POA: Diagnosis not present

## 2022-10-11 MED ORDER — PREDNISONE 20 MG PO TABS
40.0000 mg | ORAL_TABLET | Freq: Every day | ORAL | 0 refills | Status: DC
Start: 2022-10-11 — End: 2022-10-21

## 2022-10-11 NOTE — Patient Instructions (Signed)
  [name], thank you for joining Piedad Climes, PA-C for today's virtual visit.  While this provider is not your primary care provider (PCP), if your PCP is located in our provider database this encounter information will be shared with them immediately following your visit.   A [facility] MyChart account gives you access to today's visit and all your visits, tests, and labs performed at [facility] " click here if you don't have a [facility] MyChart account or go to [email or web address]  Consent: (Patient) [name] provided verbal consent for this virtual visit at the beginning of the encounter.  Current Medications:  Current Outpatient Medications:    predniSONE (DELTASONE) 20 MG tablet, Take 2 tablets (40 mg total) by mouth daily with breakfast., Disp: 10 tablet, Rfl: 0   albuterol (VENTOLIN HFA) 108 (90 Base) MCG/ACT inhaler, Inhale 1-2 puffs into the lungs every 6 (six) hours as needed for wheezing or shortness of breath., Disp: 18 g, Rfl: 0   nystatin cream (MYCOSTATIN), Apply 1 Application topically 2 (two) times daily., Disp: 30 g, Rfl: 0   rosuvastatin (CRESTOR) 10 MG tablet, Take 1 tablet (10 mg total) by mouth daily., Disp: 90 tablet, Rfl: 3   Vitamin D, Ergocalciferol, (DRISDOL) 1.25 MG (50000 UNIT) CAPS capsule, Take 1 capsule (50,000 Units total) by mouth every 7 (seven) days., Disp: 5 capsule, Rfl: 3   Medications ordered in this encounter:  Meds ordered this encounter  Medications   predniSONE (DELTASONE) 20 MG tablet    Sig: Take 2 tablets (40 mg total) by mouth daily with breakfast.    Dispense:  10 tablet    Refill:  0    Order Specific Question:   Supervising Provider    Answer:   Merrilee Jansky [id number]     *If you need refills on other medications prior to your next appointment, please contact your pharmacy*  Follow-Up: Call back or seek an in-person evaluation if the symptoms worsen or if the condition fails to improve as  anticipated.  [facility] [phone]  Other Instructions Keep hydrated and rest. Ok to continue Tylenol and caffeine use. Start heating pad to shoulders in 10-minute intervals, a few times per days. Take the prednisone as directed. If no resolution of acute migraine within 24 hours or any worsening symptoms, you need an ER evaluation ASAP.  Follow-up with your PCP regarding preventive medication and neurology appt.    If you have been instructed to have an in-person evaluation today at a local Urgent Care facility, please use the link below. It will take you to a list of all of our available [facility], including address, phone number and hours of operation. Please do not delay care.  [facility]  If you or a family member do not have a primary care provider, use the link below to schedule a visit and establish care. When you choose a [facility] primary care physician or advanced practice provider, you gain a long-term partner in health. Find a Primary Care Provider  Learn more about [facility]'s in-office and virtual care options: [facility] - Get Care Now

## 2022-10-11 NOTE — Progress Notes (Signed)
Virtual Visit Consent   [name], you are scheduled for a virtual visit with a [facility] provider today. Just as with appointments in the office, your consent must be obtained to participate. Your consent will be active for this visit and any virtual visit you may have with one of our providers in the next 365 days. If you have a MyChart account, a copy of this consent can be sent to you electronically.  As this is a virtual visit, video technology does not allow for your provider to perform a traditional examination. This may limit your provider's ability to fully assess your condition. If your provider identifies any concerns that need to be evaluated in person or the need to arrange testing (such as labs, EKG, etc.), we will make arrangements to do so. Although advances in technology are sophisticated, we cannot ensure that it will always work on either your end or our end. If the connection with a video visit is poor, the visit may have to be switched to a telephone visit. With either a video or telephone visit, we are not always able to ensure that we have a secure connection.  By engaging in this virtual visit, you consent to the provision of healthcare and authorize for your insurance to be billed (if applicable) for the services provided during this visit. Depending on your insurance coverage, you may receive a charge related to this service.  I need to obtain your verbal consent now. Are you willing to proceed with your visit today? [name] has provided verbal consent on 10/11/2022 for a virtual visit (video or telephone). Piedad Climes, [place]  Date: 10/11/2022 11:30 AM  Virtual Visit via Video Note   I, Piedad Climes, connected with  [name]  ([id number], 16-Oct-1985) on 10/11/22 at 11:15 AM EDT by a video-enabled telemedicine application and verified that I am speaking with the correct person using two identifiers.  Location: Patient: Virtual Visit Location Patient:  Home Provider: Virtual Visit Location Provider: Home Office   I discussed the limitations of evaluation and management by telemedicine and the availability of in person appointments. The patient expressed understanding and agreed to proceed.    History of Present Illness: [name] is a 37 y.o. who identifies as a female who was assigned female at birth, and is being seen today for recurring migraine headaches -- episodic. Notes in the past 10 days she has had daily migraine headaches described as now bilateral with nausea, sensitivity to light and .  Now with associated tension in neck and shoulders with tightness. Sleep and staying in the dark room are the only thing helping. Diagnosed with history of complex migraines during a hospitalization in 2017.  Awaiting Neurology referral appt.   OTC -- Tylenol, Excedrin, Ibuprofen, Vicodin from old surgery  HPI: HPI  Problems:  Patient Active Problem List   Diagnosis Date Noted   Hidradenitis suppurativa 10/05/2022   Vitamin D deficiency 09/14/2022   Morbid obesity (HCC) 09/29/2021   Borderline personality disorder (HCC) 09/27/2021   Hyperlipidemia 09/25/2021   Bipolar affective disorder, currently depressed, moderate (HCC) 03/22/2016   S/P C-section 03/22/2016   History of bilateral tubal ligation 02/25/2016   Bipolar 1 disorder (HCC) 09/01/2005    Allergies:  Allergies  Allergen Reactions   Sodium Hypochlorite Shortness Of Breath   Keflex [Cephalexin]    Latex    Skin Bleaching [Hydroquinone]     Facial swelling, SOB   Medications:  Current  Outpatient Medications:    predniSONE (DELTASONE) 20 MG tablet, Take 2 tablets (40 mg total) by mouth daily with breakfast., Disp: 10 tablet, Rfl: 0   albuterol (VENTOLIN HFA) 108 (90 Base) MCG/ACT inhaler, Inhale 1-2 puffs into the lungs every 6 (six) hours as needed for wheezing or shortness of breath., Disp: 18 g, Rfl: 0   nystatin cream (MYCOSTATIN), Apply 1 Application topically 2 (two)  times daily., Disp: 30 g, Rfl: 0   rosuvastatin (CRESTOR) 10 MG tablet, Take 1 tablet (10 mg total) by mouth daily., Disp: 90 tablet, Rfl: 3   Vitamin D, Ergocalciferol, (DRISDOL) 1.25 MG (50000 UNIT) CAPS capsule, Take 1 capsule (50,000 Units total) by mouth every 7 (seven) days., Disp: 5 capsule, Rfl: 3  Observations/Objective: Patient is well-developed, well-nourished in no acute distress.  Resting comfortably  at home.  Head is normocephalic, atraumatic.  No labored breathing.  Speech is clear and coherent with logical content.  Patient is alert and oriented at baseline.   Assessment and Plan: 1. Acute migraine - predniSONE (DELTASONE) 20 MG tablet; Take 2 tablets (40 mg total) by mouth daily with breakfast.  Dispense: 10 tablet; Refill: 0  Daily over past 1.5 weeks. Awaiting Neurology appt. Prior history of complex migraines. OTC insufficient. No alarm signs or symptoms present. Prednisone to break headache cycle. Zofran per orders. If not resolution in 24 hours or anything worsening, needs in person evaluation ASAP, preferably ER.   Follow Up Instructions: I discussed the assessment and treatment plan with the patient. The patient was provided an opportunity to ask questions and all were answered. The patient agreed with the plan and demonstrated an understanding of the instructions.  A copy of instructions were sent to the patient via MyChart unless otherwise noted below.   The patient was advised to call back or seek an in-person evaluation if the symptoms worsen or if the condition fails to improve as anticipated.  Time:  I spent 10 minutes with the patient via telehealth technology discussing the above problems/concerns.    Piedad Climes, PA-C

## 2022-10-17 ENCOUNTER — Encounter: Payer: Self-pay | Admitting: Family Medicine

## 2022-10-17 DIAGNOSIS — Z91040 Latex allergy status: Secondary | ICD-10-CM | POA: Diagnosis not present

## 2022-10-17 DIAGNOSIS — G43809 Other migraine, not intractable, without status migrainosus: Secondary | ICD-10-CM | POA: Diagnosis not present

## 2022-10-17 DIAGNOSIS — Z91048 Other nonmedicinal substance allergy status: Secondary | ICD-10-CM | POA: Diagnosis not present

## 2022-10-17 DIAGNOSIS — G43909 Migraine, unspecified, not intractable, without status migrainosus: Secondary | ICD-10-CM | POA: Diagnosis not present

## 2022-10-18 ENCOUNTER — Telehealth: Payer: Self-pay

## 2022-10-18 NOTE — Transitions of Care (Post Inpatient/ED Visit) (Signed)
   10/18/2022  Name: [name] MRN: [id number] DOB: 06-Dec-1985  Today's TOC FU Call Status: Today's TOC FU Call Status:: Successful TOC FU Call Competed TOC FU Call Complete Date: 10/18/22  Transition Care Management Follow-up Telephone Call Date of Discharge: 10/17/22 Discharge Facility: Other ([facility] Facility) Name of Other ([facility]) [facility]charge Facility: [facility] Type of Discharge: Emergency Department Reason for ED Visit: Other: (migraine) How have you been since you were released from the hospital?: Better Any questions or concerns?: No  Items Reviewed: Did you receive and understand the discharge instructions provided?: No Medications obtained,verified, and reconciled?: Yes (Medications Reviewed) Any new allergies since your discharge?: No Dietary orders reviewed?: Yes Do you have support at home?: Yes People in Home: spouse  Medications Reviewed Today: Medications Reviewed Today     Reviewed by Karena Addison, LPN (Licensed Practical Nurse) on 10/18/22 at 1008  Med List Status: <None>   Medication Order Taking? Sig Documenting Provider Last Dose Status Informant  albuterol (VENTOLIN HFA) 108 (90 Base) MCG/ACT inhaler [id number] No Inhale 1-2 puffs into the lungs every 6 (six) hours as needed for wheezing or shortness of breath. Valentino Nose, NP Taking Active   nystatin cream (MYCOSTATIN) [id number] No Apply 1 Application topically 2 (two) times daily. Waldon Merl, PA-C Taking Active   predniSONE (DELTASONE) 20 MG tablet [id number]  Take 2 tablets (40 mg total) by mouth daily with breakfast. Waldon Merl, PA-C  Active   rosuvastatin (CRESTOR) 10 MG tablet [id number] No Take 1 tablet (10 mg total) by mouth daily. Sonny Masters, FNP Taking Active   Vitamin D, Ergocalciferol, (DRISDOL) 1.25 MG (50000 UNIT) CAPS capsule [id number] No Take 1 capsule (50,000 Units total) by mouth every 7 (seven) days. Sonny Masters, FNP Taking Active             Home  Care and Equipment/Supplies: Were Home Health Services Ordered?: NA Any new equipment or medical supplies ordered?: NA  Functional Questionnaire: Do you need assistance with bathing/showering or dressing?: No Do you need assistance with meal preparation?: No Do you need assistance with eating?: No Do you have difficulty maintaining continence: No Do you need assistance with getting out of bed/getting out of a chair/moving?: No Do you have difficulty managing or taking your medications?: No  Follow up appointments reviewed: PCP Follow-up appointment confirmed?: Yes Date of PCP follow-up appointment?: 10/25/22 Follow-up Provider: [facility] Follow-up appointment confirmed?: NA Do you need transportation to your follow-up appointment?: No Do you understand care options if your condition(s) worsen?: Yes-patient verbalized understanding    SIGNATURE Karena Addison, LPN [facility] Nurse Health Advisor Direct Dial [phone]

## 2022-10-19 ENCOUNTER — Ambulatory Visit (HOSPITAL_COMMUNITY): Payer: Medicaid Other | Admitting: Psychiatry

## 2022-10-21 ENCOUNTER — Telehealth: Payer: Medicaid Other | Admitting: Physician Assistant

## 2022-10-21 DIAGNOSIS — L732 Hidradenitis suppurativa: Secondary | ICD-10-CM

## 2022-10-21 MED ORDER — SULFAMETHOXAZOLE-TRIMETHOPRIM 800-160 MG PO TABS
1.0000 | ORAL_TABLET | Freq: Two times a day (BID) | ORAL | 0 refills | Status: DC
Start: 2022-10-21 — End: 2022-11-10

## 2022-10-21 NOTE — Progress Notes (Signed)
E Visit for Cellulitis/Hidradenitis Supparativa  We are sorry that you are not feeling well. Here is how we plan to help!  Based on what you shared with me it looks like you have cellulitis.  Cellulitis looks like areas of skin redness, swelling, and warmth; it develops as a result of bacteria entering under the skin. Little red spots and/or bleeding can be seen in skin, and tiny surface sacs containing fluid can occur. Fever can be present. Cellulitis is almost always on one side of a body, and the lower limbs are the most common site of involvement.   I have prescribed:  Bactrim DS 1 tablet by mouth twice a day for 7 days  HOME CARE:  Take your medications as ordered and take all of them, even if the skin irritation appears to be healing.   GET HELP RIGHT AWAY IF:  Symptoms that don't begin to go away within 48 hours. Severe redness persists or worsens If the area turns color, spreads or swells. If it blisters and opens, develops yellow-brown crust or bleeds. You develop a fever or chills. If the pain increases or becomes unbearable.  Are unable to keep fluids and food down.  MAKE SURE YOU   Understand these instructions. Will watch your condition. Will get help right away if you are not doing well or get worse.  Thank you for choosing an e-visit.  Your e-visit answers were reviewed by a board certified advanced clinical practitioner to complete your personal care plan. Depending upon the condition, your plan could have included both over the counter or prescription medications.  Please review your pharmacy choice. Make sure the pharmacy is open so you can pick up prescription now. If there is a problem, you may contact your provider through [employer] and have the prescription routed to another pharmacy.  Your safety is important to [place]. If you have drug allergies check your prescription carefully.   For the next 24 hours you can use MyChart to ask questions about today's  visit, request a non-urgent call back, or ask for a work or school excuse. You will get an email in the next two days asking about your experience. I hope that your e-visit has been valuable and will speed your recovery.   I have spent 5 minutes in review of e-visit questionnaire, review and updating patient chart, medical decision making and response to patient.   Margaretann Loveless, PA-C

## 2022-10-25 ENCOUNTER — Ambulatory Visit: Payer: Medicaid Other | Admitting: Family Medicine

## 2022-10-25 ENCOUNTER — Encounter: Payer: Self-pay | Admitting: Family Medicine

## 2022-10-25 VITALS — BP 135/86 | HR 92 | Temp 97.6°F | Resp 20 | Ht 66.0 in | Wt 249.4 lb

## 2022-10-25 DIAGNOSIS — G43909 Migraine, unspecified, not intractable, without status migrainosus: Secondary | ICD-10-CM | POA: Diagnosis not present

## 2022-10-25 NOTE — Progress Notes (Signed)
Subjective:  Patient ID: [name], female    DOB: 08/10/1985, 37 y.o.   MRN: [id number]  Patient Care Team: Sonny Masters, FNP as PCP - General (Family Medicine)   Chief Complaint:  Headache   HPI: [name] is a 37 y.o. female presenting on 10/25/2022 for Headache   Pt following up today for reevaluation of headaches. She did not bring her headache log with her today. States the headaches are still present but not as often. They are right sided and throbbing. Does have photophobia, phonophobia, nausea, and vomiting. States she does have an aura prior to the headaches. She has been taking Fioricet with some relief of symptoms. She has not followed up with neurology.   Headache  This is a recurrent problem. The current episode started more than 1 month ago. The problem occurs intermittently. The problem has been waxing and waning. The pain is located in the Right unilateral and frontal region. The quality of the pain is described as throbbing. The pain is moderate. Associated symptoms include nausea, phonophobia, photophobia, a visual change and vomiting. Pertinent negatives include no abdominal pain, abnormal behavior, anorexia, back pain, blurred vision, coughing, dizziness, drainage, ear pain, eye pain, eye redness, eye watering, facial sweating, fever, hearing loss, insomnia, loss of balance, muscle aches, neck pain, numbness, rhinorrhea, scalp tenderness, seizures, sinus pressure, sore throat, swollen glands, tingling, tinnitus, weakness or weight loss. The symptoms are aggravated by bright light and noise. She has tried acetaminophen (Fioricet) for the symptoms. The treatment provided moderate relief.     Relevant past medical, surgical, family, and social history reviewed and updated as indicated.  Allergies and medications reviewed and updated. Data reviewed: Chart in Epic.   Past Medical History:  Diagnosis Date   Borderline personality disorder (HCC)    Hyperlipidemia  09/25/2021   Hypertension in pregnancy    Knee pain    Nephrolithiasis    Postpartum depression    Preeclampsia     Past Surgical History:  Procedure Laterality Date   APPENDECTOMY     CESAREAN SECTION     CYSTOSCOPY/URETEROSCOPY/HOLMIUM LASER/STENT PLACEMENT Right 04/16/2021   Procedure: CYSTOSCOPY RIGHT RETROGRADE PYELOGRAM  URETEROSCOPY/HOLMIUM LASER/STENT PLACEMENT;  Surgeon: Bjorn Pippin, MD;  Location: [facility];  Service: Urology;  Laterality: Right;   HOLMIUM LASER APPLICATION Right 04/16/2021   Procedure: HOLMIUM LASER APPLICATION;  Surgeon: Bjorn Pippin, MD;  Location: [facility];  Service: Urology;  Laterality: Right;   KIDNEY STONE SURGERY  04/16/2021   r-kidney   KNEE SURGERY Right    scoped x2   WISDOM TOOTH EXTRACTION  2010    Social History   Socioeconomic History   Marital status: Divorced    Spouse name: Not on file   Number of children: Not on file   Years of education: Not on file   Highest education level: Some college, no degree  Occupational History   Not on file  Tobacco Use   Smoking status: Never   Smokeless tobacco: Never  Vaping Use   Vaping status: Never Used  Substance and Sexual Activity   Alcohol use: No   Drug use: Not Currently    Types: Marijuana    Comment: last used 02/20/13   Sexual activity: Yes    Birth control/protection: None  Other Topics Concern   Not on file  Social History Narrative   Not on file   Social Determinants of Health   Financial Resource Strain: Low  Risk  (09/02/2022)   Overall Financial Resource Strain (CARDIA)    Difficulty of Paying Living Expenses: Not very hard  Food Insecurity: No Food Insecurity (09/02/2022)   Hunger Vital Sign    Worried About Running Out of Food in the Last Year: Never true    Ran Out of Food in the Last Year: Never true  Transportation Needs: No Transportation Needs (09/02/2022)   PRAPARE - Administrator, Civil Service (Medical): No     Lack of Transportation (Non-Medical): No  Physical Activity: Sufficiently Active (09/02/2022)   Exercise Vital Sign    Days of Exercise per Week: 5 days    Minutes of Exercise per Session: 60 min  Stress: Stress Concern Present (09/02/2022)   [employer] of Occupational Health - Occupational Stress Questionnaire    Feeling of Stress : Very much  Social Connections: Moderately Isolated (09/02/2022)   Social Connection and Isolation Panel [NHANES]    Frequency of Communication with Friends and Family: More than three times a week    Frequency of Social Gatherings with Friends and Family: Once a week    Attends Religious Services: Never    Database administrator or Organizations: No    Attends Engineer, structural: Not on file    Marital Status: Living with partner  Intimate Partner Violence: Not on file    Outpatient Encounter Medications as of 10/25/2022  Medication Sig   albuterol (VENTOLIN HFA) 108 (90 Base) MCG/ACT inhaler Inhale 1-2 puffs into the lungs every 6 (six) hours as needed for wheezing or shortness of breath.   nystatin cream (MYCOSTATIN) Apply 1 Application topically 2 (two) times daily. (Patient taking differently: Apply 1 Application topically 2 (two) times daily as needed.)   rosuvastatin (CRESTOR) 10 MG tablet Take 1 tablet (10 mg total) by mouth daily.   sulfamethoxazole-trimethoprim (BACTRIM DS) 800-160 MG tablet Take 1 tablet by mouth 2 (two) times daily.   Vitamin D, Ergocalciferol, (DRISDOL) 1.25 MG (50000 UNIT) CAPS capsule Take 1 capsule (50,000 Units total) by mouth every 7 (seven) days.   No facility-administered encounter medications on file as of 10/25/2022.    Allergies  Allergen Reactions   Sodium Hypochlorite Shortness Of Breath   Keflex [Cephalexin]    Latex    Skin Bleaching [Hydroquinone]     Facial swelling, SOB    Review of Systems  Constitutional:  Negative for activity change, appetite change, chills, diaphoresis, fatigue,  fever, unexpected weight change and weight loss.  HENT: Negative.  Negative for ear pain, hearing loss, rhinorrhea, sinus pressure, sore throat and tinnitus.   Eyes:  Positive for photophobia and visual disturbance. Negative for blurred vision, pain, discharge, redness and itching.  Respiratory:  Negative for cough, chest tightness and shortness of breath.   Cardiovascular:  Negative for chest pain, palpitations and leg swelling.  Gastrointestinal:  Positive for nausea and vomiting. Negative for abdominal distention, abdominal pain, anal bleeding, anorexia, blood in stool, constipation, diarrhea and rectal pain.  Endocrine: Negative.  Negative for cold intolerance, heat intolerance, polydipsia, polyphagia and polyuria.  Genitourinary:  Negative for decreased urine volume, difficulty urinating, dysuria, frequency and urgency.  Musculoskeletal:  Negative for arthralgias, back pain, myalgias and neck pain.  Skin: Negative.   Allergic/Immunologic: Negative.   Neurological:  Positive for headaches. Negative for dizziness, tingling, tremors, seizures, syncope, facial asymmetry, speech difficulty, weakness, light-headedness, numbness and loss of balance.  Hematological: Negative.   Psychiatric/Behavioral:  Negative for confusion, hallucinations, sleep disturbance and  suicidal ideas. The patient does not have insomnia.   All other systems reviewed and are negative.       Objective:  BP 135/86   Pulse 92   Temp 97.6 F (36.4 C) (Oral)   Resp 20   Ht 5\' 6"  (1.676 m)   Wt 249 lb 6 oz (113.1 kg)   LMP 09/04/2022   SpO2 96%   BMI 40.25 kg/m    Wt Readings from Last 3 Encounters:  10/25/22 249 lb 6 oz (113.1 kg)  10/05/22 252 lb 3.2 oz (114.4 kg)  09/13/22 252 lb (114.3 kg)    Physical Exam Vitals and nursing note reviewed.  Constitutional:      General: She is not in acute distress.    Appearance: Normal appearance. She is well-developed and well-groomed. She is morbidly obese. She is  not ill-appearing, toxic-appearing or diaphoretic.  HENT:     Head: Normocephalic and atraumatic.     Jaw: There is normal jaw occlusion.     Right Ear: Hearing normal.     Left Ear: Hearing normal.     Nose: Nose normal.     Mouth/Throat:     Lips: Pink.     Mouth: Mucous membranes are moist.     Pharynx: Oropharynx is clear. Uvula midline.  Eyes:     General: Lids are normal. No visual field deficit.    Extraocular Movements: Extraocular movements intact.     Conjunctiva/sclera: Conjunctivae normal.     Pupils: Pupils are equal, round, and reactive to light.  Neck:     Thyroid: No thyroid mass, thyromegaly or thyroid tenderness.     Vascular: No carotid bruit or JVD.     Trachea: Trachea and phonation normal.  Cardiovascular:     Rate and Rhythm: Normal rate and regular rhythm.     Chest Wall: PMI is not displaced.     Pulses: Normal pulses.     Heart sounds: Normal heart sounds. No murmur heard.    No friction rub. No gallop.  Pulmonary:     Effort: Pulmonary effort is normal. No respiratory distress.     Breath sounds: Normal breath sounds. No wheezing.  Abdominal:     General: Bowel sounds are normal. There is no distension or abdominal bruit.     Palpations: Abdomen is soft. There is no hepatomegaly or splenomegaly.     Tenderness: There is no abdominal tenderness. There is no right CVA tenderness or left CVA tenderness.     Hernia: No hernia is present.  Musculoskeletal:        General: Normal range of motion.     Cervical back: Normal range of motion and neck supple.     Right lower leg: No edema.     Left lower leg: No edema.  Lymphadenopathy:     Cervical: No cervical adenopathy.  Skin:    General: Skin is warm and dry.     Capillary Refill: Capillary refill takes less than 2 seconds.     Coloration: Skin is not cyanotic, jaundiced or pale.     Findings: No rash.  Neurological:     General: No focal deficit present.     Mental Status: She is alert and  oriented to person, place, and time.     GCS: GCS eye subscore is 4. GCS verbal subscore is 5. GCS motor subscore is 6.     Cranial Nerves: No cranial nerve deficit, dysarthria or facial asymmetry.     Sensory:  Sensation is intact. No sensory deficit.     Motor: Motor function is intact. No weakness.     Coordination: Coordination is intact. Romberg sign negative. Coordination normal.     Gait: Gait is intact. Gait normal.     Deep Tendon Reflexes: Reflexes are normal and symmetric.  Psychiatric:        Attention and Perception: Attention and perception normal.        Mood and Affect: Mood and affect normal.        Speech: Speech normal.        Behavior: Behavior normal. Behavior is cooperative.        Thought Content: Thought content normal.        Cognition and Memory: Cognition and memory normal.        Judgment: Judgment normal.     Results for orders placed or performed in visit on 09/13/22  CBC with Differential/Platelet  Result Value Ref Range   WBC 4.0 3.4 - 10.8 x10E3/uL   RBC 4.00 3.77 - 5.28 x10E6/uL   Hemoglobin 12.3 11.1 - 15.9 g/dL   Hematocrit 52.8 41.3 - 46.6 %   MCV 91 79 - 97 fL   MCH 30.8 26.6 - 33.0 pg   MCHC 33.8 31.5 - 35.7 g/dL   RDW 24.4 01.0 - 27.2 %   Platelets 308 150 - 450 x10E3/uL   Neutrophils 45 Not Estab. %   Lymphs 45 Not Estab. %   Monocytes 7 Not Estab. %   Eos 2 Not Estab. %   Basos 1 Not Estab. %   Neutrophils Absolute 1.8 1.4 - 7.0 x10E3/uL   Lymphocytes Absolute 1.8 0.7 - 3.1 x10E3/uL   Monocytes Absolute 0.3 0.1 - 0.9 x10E3/uL   EOS (ABSOLUTE) 0.1 0.0 - 0.4 x10E3/uL   Basophils Absolute 0.1 0.0 - 0.2 x10E3/uL   Immature Granulocytes 0 Not Estab. %   Immature Grans (Abs) 0.0 0.0 - 0.1 x10E3/uL  CMP14+EGFR  Result Value Ref Range   Glucose 89 70 - 99 mg/dL   BUN 7 6 - 20 mg/dL   Creatinine, Ser 5.36 0.57 - 1.00 mg/dL   eGFR 644 >03 KV/QQV/9.56   BUN/Creatinine Ratio 9 9 - 23   Sodium 137 134 - 144 mmol/L   Potassium 4.5 3.5 -  5.2 mmol/L   Chloride 104 96 - 106 mmol/L   CO2 21 20 - 29 mmol/L   Calcium 9.7 8.7 - 10.2 mg/dL   Total Protein 6.5 6.0 - 8.5 g/dL   Albumin 4.4 3.9 - 4.9 g/dL   Globulin, Total 2.1 1.5 - 4.5 g/dL   Albumin/Globulin Ratio 2.1 1.2 - 2.2   Bilirubin Total 0.3 0.0 - 1.2 mg/dL   Alkaline Phosphatase 75 44 - 121 IU/L   AST 23 0 - 40 IU/L   ALT 31 0 - 32 IU/L  Thyroid Panel With TSH  Result Value Ref Range   TSH 1.260 0.450 - 4.500 uIU/mL   T4, Total 10.4 4.5 - 12.0 ug/dL   T3 Uptake Ratio 23 (L) 24 - 39 %   Free Thyroxine Index 2.4 1.2 - 4.9  Lipid panel  Result Value Ref Range   Cholesterol, Total 257 (H) 100 - 199 mg/dL   Triglycerides 387 (H) 0 - 149 mg/dL   HDL 47 >56 mg/dL   VLDL Cholesterol Cal 31 5 - 40 mg/dL   LDL Chol Calc (NIH) 433 (H) 0 - 99 mg/dL   Chol/HDL Ratio 5.5 (H) 0.0 - 4.4  ratio  VITAMIN D 25 Hydroxy (Vit-D Deficiency, Fractures)  Result Value Ref Range   Vit D, 25-Hydroxy 9.4 (L) 30.0 - 100.0 ng/mL  Bayer DCA Hb A1c Waived  Result Value Ref Range   HB A1C (BAYER DCA - WAIVED) 5.1 4.8 - 5.6 %       Pertinent labs & imaging results that were available during my care of the patient were reviewed by me and considered in my medical decision making.  Assessment & Plan:  [name] was seen today for headache.  Diagnoses and all orders for this visit:  Acute migraine Improved greatly. Aware to keep headache log and to follow up with neurology as referred. Report new, worsening, or persistent symptoms.  -     Ambulatory referral to Neurology     Continue all other maintenance medications.  Follow up plan: Return if symptoms worsen or fail to improve.   Continue healthy lifestyle choices, including diet (rich in fruits, vegetables, and lean proteins, and low in salt and simple carbohydrates) and exercise (at least 30 minutes of moderate physical activity daily).  Educational handout given for headache diary  The above assessment and management plan was  discussed with the patient. The patient verbalized understanding of and has agreed to the management plan. Patient is aware to call the clinic if they develop any new symptoms or if symptoms persist or worsen. Patient is aware when to return to the clinic for a follow-up visit. Patient educated on when it is appropriate to go to the emergency department.   Kari Baars, FNP-C [facility] [phone]
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
## 2022-11-05 ENCOUNTER — Ambulatory Visit (HOSPITAL_BASED_OUTPATIENT_CLINIC_OR_DEPARTMENT_OTHER): Payer: Medicaid Other | Admitting: Psychiatry

## 2022-11-05 ENCOUNTER — Encounter (HOSPITAL_COMMUNITY): Payer: Self-pay | Admitting: Psychiatry

## 2022-11-05 DIAGNOSIS — F603 Borderline personality disorder: Secondary | ICD-10-CM

## 2022-11-05 DIAGNOSIS — F4310 Post-traumatic stress disorder, unspecified: Secondary | ICD-10-CM | POA: Diagnosis not present

## 2022-11-05 DIAGNOSIS — F411 Generalized anxiety disorder: Secondary | ICD-10-CM | POA: Diagnosis not present

## 2022-11-05 MED ORDER — LAMOTRIGINE 25 MG PO TABS
ORAL_TABLET | ORAL | 2 refills | Status: AC
Start: 2022-11-05 — End: 2022-12-19

## 2022-11-05 MED ORDER — GABAPENTIN 100 MG PO CAPS
100.0000 mg | ORAL_CAPSULE | Freq: Three times a day (TID) | ORAL | 2 refills | Status: AC | PRN
Start: 2022-11-05 — End: ?

## 2022-11-05 MED ORDER — TRAZODONE HCL 50 MG PO TABS
50.0000 mg | ORAL_TABLET | Freq: Every day | ORAL | 2 refills | Status: AC
Start: 2022-11-05 — End: ?

## 2022-11-09 ENCOUNTER — Telehealth: Payer: Medicaid Other | Admitting: Physician Assistant

## 2022-11-09 DIAGNOSIS — R509 Fever, unspecified: Secondary | ICD-10-CM

## 2022-11-09 DIAGNOSIS — L02419 Cutaneous abscess of limb, unspecified: Secondary | ICD-10-CM

## 2022-11-10 ENCOUNTER — Telehealth: Payer: Medicaid Other | Admitting: Nurse Practitioner

## 2022-11-10 DIAGNOSIS — L732 Hidradenitis suppurativa: Secondary | ICD-10-CM | POA: Diagnosis not present

## 2022-11-10 MED ORDER — DOXYCYCLINE HYCLATE 100 MG PO TABS
100.0000 mg | ORAL_TABLET | Freq: Two times a day (BID) | ORAL | 0 refills | Status: AC
Start: 2022-11-10 — End: 2022-11-20

## 2022-11-10 NOTE — Progress Notes (Signed)
Because of high fever along with abscess, I feel your condition warrants further evaluation and I recommend that you be seen in a face to face visit.   NOTE: There will be NO CHARGE for this eVisit   If you are having a true medical emergency please call 911.      For an urgent face to face visit, [place] has eight urgent care centers for your convenience:   NEW!! [facility] Center at [facility] Get Driving Directions [phone] [address] [place], [address]    [facility] Center at [facility] Get Driving Directions [phone] [address] [place], [place] [address]   [facility] Center [facility]) Get Driving Directions [phone] [address] [place], [place] [address]  [facility] Center [facility]) Get Driving Directions [phone] [address] [place],  [place]  [address]  [facility] Urgent Care Center [facility] - at [employer]  [phone] [address] [place],  [place] [address]   [facility] Health Urgent Care at [facility] Get Driving Directions [phone] [address] [place], [place] [address]   [facility] Health Urgent Care at [facility] Get Driving Directions  [phone] [address] [place], [place] [address]   [facility] Health Urgent Care at [facility] Directions [phone] [address] [place], [place] [address]  Your MyChart E-visit questionnaire answers were reviewed by a board certified advanced clinical practitioner to complete your personal care plan based on your specific symptoms.  Thank you for using e-Visits.

## 2022-11-10 NOTE — Progress Notes (Signed)
E Visit for Cellulitis  We are sorry that you are not feeling well. Here is how we plan to help!  Based on what you shared with me it looks like you have cellulitis.  Cellulitis looks like areas of skin redness, swelling, and warmth; it develops as a result of bacteria entering under the skin. Little red spots and/or bleeding can be seen in skin, and tiny surface sacs containing fluid can occur. Fever can be present. Cellulitis is almost always on one side of a body, and the lower limbs are the most common site of involvement.   I have prescribed:    Meds ordered this encounter  Medications   doxycycline (VIBRA-TABS) 100 MG tablet    Sig: Take 1 tablet (100 mg total) by mouth 2 (two) times daily for 10 days.    Dispense:  20 tablet    Refill:  0     HOME CARE:  Take your medications as ordered and take all of them, even if the skin irritation appears to be healing.   GET HELP RIGHT AWAY IF:  Symptoms that don't begin to go away within 48 hours. Severe redness persists or worsens If the area turns color, spreads or swells. If it blisters and opens, develops yellow-brown crust or bleeds. You develop a fever or chills. If the pain increases or becomes unbearable.  Are unable to keep fluids and food down.  MAKE SURE YOU   Understand these instructions. Will watch your condition. Will get help right away if you are not doing well or get worse.  Thank you for choosing an e-visit.  Your e-visit answers were reviewed by a board certified advanced clinical practitioner to complete your personal care plan. Depending upon the condition, your plan could have included both over the counter or prescription medications.  Please review your pharmacy choice. Make sure the pharmacy is open so you can pick up prescription now. If there is a problem, you may contact your provider through [employer] and have the prescription routed to another pharmacy.  Your safety is important to [place]. If  you have drug allergies check your prescription carefully.   For the next 24 hours you can use MyChart to ask questions about today's visit, request a non-urgent call back, or ask for a work or school excuse. You will get an email in the next two days asking about your experience. I hope that your e-visit has been valuable and will speed your recovery.   I spent approximately 5 minutes reviewing the patient's history, current symptoms and coordinating their care today.

## 2022-11-15 DIAGNOSIS — H5213 Myopia, bilateral: Secondary | ICD-10-CM | POA: Diagnosis not present

## 2022-11-15 DIAGNOSIS — H52223 Regular astigmatism, bilateral: Secondary | ICD-10-CM | POA: Diagnosis not present

## 2022-11-29 ENCOUNTER — Telehealth: Payer: Medicaid Other | Admitting: Emergency Medicine

## 2022-11-29 DIAGNOSIS — N3000 Acute cystitis without hematuria: Secondary | ICD-10-CM | POA: Diagnosis not present

## 2022-11-29 MED ORDER — NITROFURANTOIN MONOHYD MACRO 100 MG PO CAPS: 100.0000 mg | ORAL_CAPSULE | Freq: Two times a day (BID) | ORAL | 0 refills | Status: AC

## 2022-11-29 NOTE — Progress Notes (Signed)
E-Visit for Urinary Problems ? ?We are sorry that you are not feeling well.  Here is how we plan to help! ? ?Based on what you shared with me it looks like you most likely have a simple urinary tract infection. ? ?A UTI (Urinary Tract Infection) is a bacterial infection of the bladder. ? ?Most cases of urinary tract infections are simple to treat but a key part of your care is to encourage you to drink plenty of fluids and watch your symptoms carefully. ? ?I have prescribed MacroBid 100 mg twice a day for 5 days.  Your symptoms should gradually improve. Call us if the burning in your urine worsens, you develop worsening fever, back pain or pelvic pain or if your symptoms do not resolve after completing the antibiotic. ? ?Urinary tract infections can be prevented by drinking plenty of water to keep your body hydrated.  Also be sure when you wipe, wipe from front to back and don't hold it in!  If possible, empty your bladder every 4 hours. ? ?HOME CARE ?Drink plenty of fluids ?Compete the full course of the antibiotics even if the symptoms resolve ?Remember, when you need to go?go. Holding in your urine can increase the likelihood of getting a UTI! ?GET HELP RIGHT AWAY IF: ?You cannot urinate ?You get a high fever ?Worsening back pain occurs ?You see blood in your urine ?You feel sick to your stomach or throw up ?You feel like you are going to pass out ? ?MAKE SURE YOU  ?Understand these instructions. ?Will watch your condition. ?Will get help right away if you are not doing well or get worse. ? ? ?Thank you for choosing an e-visit. ? ?Your e-visit answers were reviewed by a board certified advanced clinical practitioner to complete your personal care plan. Depending upon the condition, your plan could have included both over the counter or prescription medications. ? ?Please review your pharmacy choice. Make sure the pharmacy is open so you can pick up prescription now. If there is a problem, you may contact your  provider through MyChart messaging and have the prescription routed to another pharmacy.  Your safety is important to us. If you have drug allergies check your prescription carefully.  ? ?For the next 24 hours you can use MyChart to ask questions about today's visit, request a non-urgent call back, or ask for a work or school excuse. ?You will get an email in the next two days asking about your experience. I hope that your e-visit has been valuable and will speed your recovery. ? ?I have spent 5 minutes in review of e-visit questionnaire, review and updating patient chart, medical decision making and response to patient.  ? ?Angela Kabbe, PhD, FNP-BC ?  ?

## 2022-12-08 DIAGNOSIS — L732 Hidradenitis suppurativa: Secondary | ICD-10-CM | POA: Diagnosis not present

## 2022-12-13 DIAGNOSIS — G43011 Migraine without aura, intractable, with status migrainosus: Secondary | ICD-10-CM | POA: Diagnosis not present
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
## 2022-12-20 DIAGNOSIS — M1711 Unilateral primary osteoarthritis, right knee: Secondary | ICD-10-CM | POA: Diagnosis not present

## 2022-12-20 DIAGNOSIS — M25561 Pain in right knee: Secondary | ICD-10-CM | POA: Diagnosis not present

## 2022-12-28 ENCOUNTER — Encounter: Payer: Self-pay | Admitting: Family Medicine

## 2022-12-29 ENCOUNTER — Other Ambulatory Visit: Payer: Self-pay | Admitting: Family Medicine

## 2022-12-29 DIAGNOSIS — G43909 Migraine, unspecified, not intractable, without status migrainosus: Secondary | ICD-10-CM

## 2022-12-29 MED ORDER — ACETAMINOPHEN-CAFFEINE 500-65 MG PO TABS
2.0000 | ORAL_TABLET | Freq: Three times a day (TID) | ORAL | 0 refills | Status: DC | PRN
Start: 2022-12-29 — End: 2023-02-22
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
## 2023-01-10 ENCOUNTER — Telehealth (HOSPITAL_COMMUNITY): Payer: Medicaid Other | Admitting: Psychiatry

## 2023-01-10 DIAGNOSIS — F4310 Post-traumatic stress disorder, unspecified: Secondary | ICD-10-CM | POA: Diagnosis not present

## 2023-01-10 DIAGNOSIS — F603 Borderline personality disorder: Secondary | ICD-10-CM | POA: Diagnosis not present

## 2023-01-10 DIAGNOSIS — F411 Generalized anxiety disorder: Secondary | ICD-10-CM

## 2023-01-10 MED ORDER — GABAPENTIN 100 MG PO CAPS: 200.0000 mg | ORAL_CAPSULE | Freq: Three times a day (TID) | ORAL | 2 refills | Status: DC | PRN

## 2023-01-10 MED ORDER — TRAZODONE HCL 100 MG PO TABS: 100.0000 mg | ORAL_TABLET | Freq: Every day | ORAL | 2 refills | Status: DC

## 2023-01-10 MED ORDER — LAMOTRIGINE 100 MG PO TABS
100.0000 mg | ORAL_TABLET | Freq: Every day | ORAL | 2 refills | Status: DC
Start: 2023-01-10 — End: 2023-02-23

## 2023-01-11 ENCOUNTER — Encounter (HOSPITAL_COMMUNITY): Payer: Self-pay | Admitting: Psychiatry

## 2023-01-12 ENCOUNTER — Encounter: Payer: Self-pay | Admitting: Family Medicine

## 2023-01-12 ENCOUNTER — Encounter: Payer: Medicaid Other | Admitting: Family Medicine

## 2023-01-13 ENCOUNTER — Encounter (HOSPITAL_COMMUNITY): Payer: Self-pay

## 2023-01-18 ENCOUNTER — Telehealth: Payer: Medicaid Other

## 2023-01-18 DIAGNOSIS — R3989 Other symptoms and signs involving the genitourinary system: Secondary | ICD-10-CM

## 2023-01-19 DIAGNOSIS — L732 Hidradenitis suppurativa: Secondary | ICD-10-CM | POA: Diagnosis not present

## 2023-01-19 MED ORDER — NITROFURANTOIN MONOHYD MACRO 100 MG PO CAPS
100.0000 mg | ORAL_CAPSULE | Freq: Two times a day (BID) | ORAL | 0 refills | Status: DC
Start: 2023-01-19 — End: 2023-02-22

## 2023-01-19 MED ORDER — FLUCONAZOLE 150 MG PO TABS: ORAL_TABLET | ORAL | 0 refills | Status: DC

## 2023-01-19 NOTE — Progress Notes (Signed)
I have spent 5 minutes in review of e-visit questionnaire, review and updating patient chart, medical decision making and response to patient.   Mia Milan Cody Jacklynn Dehaas, PA-C    

## 2023-01-19 NOTE — Progress Notes (Signed)
E-Visit for Urinary Problems  We are sorry that you are not feeling well.  Here is how we plan to help!  Based on what you shared with me it looks like you most likely have a simple urinary tract infection.  A UTI (Urinary Tract Infection) is a bacterial infection of the bladder.  Most cases of urinary tract infections are simple to treat but a key part of your care is to encourage you to drink plenty of fluids and watch your symptoms carefully.  I have prescribed MacroBid 100 mg twice a day for 5 days.  Your symptoms should gradually improve. Call us if the burning in your urine worsens, you develop worsening fever, back pain or pelvic pain or if your symptoms do not resolve after completing the antibiotic.  I have also sent in a two dose course of Diflucan for possible yeast.   Urinary tract infections can be prevented by drinking plenty of water to keep your body hydrated.  Also be sure when you wipe, wipe from front to back and don't hold it in!  If possible, empty your bladder every 4 hours.  HOME CARE Drink plenty of fluids Compete the full course of the antibiotics even if the symptoms resolve Remember, when you need to go.go. Holding in your urine can increase the likelihood of getting a UTI! GET HELP RIGHT AWAY IF: You cannot urinate You get a high fever Worsening back pain occurs You see blood in your urine You feel sick to your stomach or throw up You feel like you are going to pass out  MAKE SURE YOU  Understand these instructions. Will watch your condition. Will get help right away if you are not doing well or get worse.   Thank you for choosing an e-visit.  Your e-visit answers were reviewed by a board certified advanced clinical practitioner to complete your personal care plan. Depending upon the condition, your plan could have included both over the counter or prescription medications.  Please review your pharmacy choice. Make sure the pharmacy is open so you can  pick up prescription now. If there is a problem, you may contact your provider through [employer] and have the prescription routed to another pharmacy.  Your safety is important to [place]. If you have drug allergies check your prescription carefully.   For the next 24 hours you can use MyChart to ask questions about today's visit, request a non-urgent call back, or ask for a work or school excuse. You will get an email in the next two days asking about your experience. I hope that your e-visit has been valuable and will speed your recovery.

## 2023-01-20 ENCOUNTER — Encounter: Payer: Self-pay | Admitting: Neurology

## 2023-01-20 ENCOUNTER — Ambulatory Visit: Payer: Medicaid Other | Admitting: Neurology

## 2023-01-20 VITALS — BP 143/96 | HR 77 | Ht 66.0 in | Wt 247.4 lb

## 2023-01-20 DIAGNOSIS — G43711 Chronic migraine without aura, intractable, with status migrainosus: Secondary | ICD-10-CM | POA: Diagnosis not present

## 2023-01-20 DIAGNOSIS — G4719 Other hypersomnia: Secondary | ICD-10-CM

## 2023-01-20 DIAGNOSIS — R7309 Other abnormal glucose: Secondary | ICD-10-CM | POA: Diagnosis not present

## 2023-01-20 DIAGNOSIS — I10 Essential (primary) hypertension: Secondary | ICD-10-CM | POA: Diagnosis not present

## 2023-01-20 DIAGNOSIS — R519 Headache, unspecified: Secondary | ICD-10-CM

## 2023-01-20 DIAGNOSIS — E785 Hyperlipidemia, unspecified: Secondary | ICD-10-CM | POA: Diagnosis not present

## 2023-01-20 DIAGNOSIS — G43101 Migraine with aura, not intractable, with status migrainosus: Secondary | ICD-10-CM

## 2023-01-20 MED ORDER — NURTEC 75 MG PO TBDP
75.0000 mg | ORAL_TABLET | Freq: Every day | ORAL | 6 refills | Status: DC | PRN
Start: 2023-01-20 — End: 2023-02-22

## 2023-01-20 MED ORDER — ROSUVASTATIN CALCIUM 40 MG PO TABS
40.0000 mg | ORAL_TABLET | Freq: Every day | ORAL | 4 refills | Status: DC
Start: 2023-01-20 — End: 2024-02-06

## 2023-01-20 MED ORDER — EMGALITY 120 MG/ML ~~LOC~~ SOAJ
120.0000 mg | SUBCUTANEOUS | 11 refills | Status: DC
Start: 2023-01-20 — End: 2023-05-24

## 2023-01-20 MED ORDER — SEMAGLUTIDE-WEIGHT MANAGEMENT 0.25 MG/0.5ML ~~LOC~~ SOAJ
0.2500 mg | SUBCUTANEOUS | 0 refills | Status: AC
Start: 2023-01-20 — End: 2023-02-17

## 2023-01-20 MED ORDER — CANDESARTAN CILEXETIL 4 MG PO TABS
4.0000 mg | ORAL_TABLET | Freq: Every day | ORAL | 4 refills | Status: DC
Start: 2023-01-20 — End: 2023-04-27

## 2023-01-20 NOTE — Patient Instructions (Addendum)
Prevention Migraine with and without aura: Will try candesartan, start emgality for migraines with and without aura daily headaches and 15 total migraine days a week  Migarines with aure: She has 5 migraines a month with aura. 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 15 moderate to severe migraines and 5 of those are so sever she can;t get out the bed, care for her children, devasting to her life quality. She can snore, she wakes up with headaches, she is fatigues. Partner says she snores, she dozes off on the couch, and they tell her she snores even sitting whem falling asleep in front of the TV with frequent awakwining at night. BMI almost 40.  BMI almost 40: Recommend Wegovy. Medicaid recently said they will approve. She has tried a formal weight loss program for > 3 months and has not been able to lost more than 5% of her body weight. She has multiple reasons to address her weight including migraines(obesity can be a factor in chronification), Losing weight is recommended for hidrenitis suppurativa, she has HTN and Hypercholerolemia in addition to these risk factors migraine with aura also increase stroke risk.   She has had multiple imaging in the past but low threshold to repeat MRI brain  HLD: increase creator due to stroke risk in migriane with aura need to address all modifiable risk favtors like HYN and HLD   Discussed:  There is increased risk for stroke in women with migraine with aura and a contraindication for the combined contraceptive pill for use by women who have migraine with aura. The risk for women with migraine without aura is lower. However other risk factors like smoking are far more likely to increase stroke risk than migraine. There is a recommendation for no smoking and for the use of OCPs without estrogen such as progestogen only pills  particularly for women with migraine with aura.Marland Kitchen People who have migraine headaches with auras may be 3 times more likely to have a stroke caused by a blood clot, compared to migraine patients who don't see auras. Women who take hormone-replacement therapy may be 30 percent more likely to suffer a clot-based stroke than women not taking medication containing estrogen. Other risk factors like smoking and high blood pressure may be  much more important. And stroke is still a rare complication due to migraine aura and is controversial and lower doses may not cause a risk.  Hemiplegic migraine is a rare type of migraine that causes a headache and weakness on one side of the body. The weakness, also known as hemiplegia, is a motor symptom that usually occurs during the migraine's aura phase, right before or during the headache. Other aura symptoms can include vision, speech, or sensation impairment  Sleep Apnea  Sleep apnea is a condition that affects your breathing while you are sleeping. Your tongue or soft tissue in your throat may block the flow of air while you sleep. You may have shallow breathing or stop breathing for short periods of time. People with sleep apnea may snore loudly. There are three kinds of sleep apnea: Obstructive sleep apnea. This kind is caused by a blocked or collapsed airway. This is the most common. Central sleep apnea. This kind happens when the part of the brain that controls breathing does not send the correct signals to the muscles that control breathing. Mixed sleep apnea.  This is a combination of obstructive and central sleep apnea. What are the causes? The most common cause of sleep apnea is a collapsed or blocked airway. What increases the risk? Being very overweight. Having family members with sleep apnea. Having a tongue or tonsils that are larger than normal. Having a small airway or jaw problems. Being older. What are the signs or symptoms? Loud  snoring. Restless sleep. Trouble staying asleep. Being sleepy or tired during the day. Waking up gasping or choking. Having a headache in the morning. Mood swings. Having a hard time remembering things and concentrating. How is this diagnosed? A medical history. A physical exam. A sleep study. This is also called a polysomnography test. This test is done at a sleep lab or in your home while you are sleeping. How is this treated? Treatment may include: Sleeping on your side. Losing weight if you're overweight. Wearing an oral appliance. This is a mouthpiece that moves your lower jaw forward. Using a positive airway pressure (PAP) device to keep your airways open while you sleep, such as: A continuous positive airway pressure (CPAP) device. This device gives forced air through a mask when you breathe out. This keeps your airways open. A bilevel positive airway pressure (BIPAP) device. This device gives forced air through a mask when you breathe in and when you breathe out to keep your airways open. Having surgery if other treatments do not work. If your sleep apnea is not treated, you may be at risk for: Heart failure. Heart attack. Stroke. Type 2 diabetes or a problem with your blood sugar called insulin resistance. Follow these instructions at home: Medicines Take your medicines only as told by your health care provider. Avoid alcohol, medicines to help you relax, and certain pain medicines. These may make sleep apnea worse. General instructions Do not smoke, vape, or use products with nicotine or tobacco in them. If you need help quitting, talk with your provider. If you were given a PAP device to open your airway while you sleep, use it as told by your provider. If you're having surgery, make sure to tell your provider you have sleep apnea. You may need to bring your PAP device with you. Contact a health care provider if: The PAP device that you were given to use during sleep  bothers you or does not seem to be working. You do not feel better or you feel worse. Get help right away if: You have trouble breathing. You have chest pain. You have trouble talking. One side of your body feels weak. A part of your face is hanging down. These symptoms may be an emergency. Call 911 right away. Do not wait to see if the symptoms will go away. Do not drive yourself to the hospital. This information is not intended to replace advice given to you by your health care provider. Make sure you discuss any questions you have with your health care provider. Document Revised: 06/02/2022 Document Reviewed: 06/02/2022 Elsevier Patient Education  2024 [employer]   Rimegepant Disintegrating Tablets What is this medication? RIMEGEPANT (ri ME je pant) prevents and treats migraines. It works by blocking a substance in the body that causes migraines. This medicine may be used for other purposes; ask your health care provider or pharmacist if you have questions. COMMON BRAND NAME(S): NURTEC ODT What should I tell my care team before I take this medication? They need to know if you have any of these conditions: Kidney disease Liver disease An unusual  or allergic reaction to rimegepant, other medications, foods, dyes, or preservatives Pregnant or trying to get pregnant Breast-feeding How should I use this medication? Take this medication by mouth. Take it as directed on the prescription label. Leave the tablet in the sealed pack until you are ready to take it. With dry hands, open the pack and gently remove the tablet. If the tablet breaks or crumbles, throw it away. Use a new tablet. Place the tablet in the mouth and allow it to dissolve. Then, swallow it. Do not cut, crush, or chew this medication. You do not need water to take this medication. Talk to your care team about the use of this medication in children. Special care may be needed. Overdosage: If you think you have taken too  much of this medicine contact a poison control center or emergency room at once. NOTE: This medicine is only for you. Do not share this medicine with others. What if I miss a dose? This does not apply. This medication is not for regular use. What may interact with this medication? Certain medications for fungal infections, such as fluconazole, itraconazole Rifampin This list may not describe all possible interactions. Give your health care provider a list of all the medicines, herbs, non-prescription drugs, or dietary supplements you use. Also tell them if you smoke, drink alcohol, or use illegal drugs. Some items may interact with your medicine. What should I watch for while using this medication? Visit your care team for regular checks on your progress. Tell your care team if your symptoms do not start to get better or if they get worse. What side effects may I notice from receiving this medication? Side effects that you should report to your care team as soon as possible: Allergic reactions--skin rash, itching, hives, swelling of the face, lips, tongue, or throat Side effects that usually do not require medical attention (report to your care team if they continue or are bothersome): Nausea Stomach pain This list may not describe all possible side effects. Call your doctor for medical advice about side effects. You may report side effects to FDA at 1-800-FDA-1088. Where should I keep my medication? Keep out of the reach of children and pets. Store at room temperature between 20 and 25 degrees C (68 and 77 degrees F). Get rid of any unused medication after the expiration date. To get rid of medications that are no longer needed or have expired: Take the medication to a medication take-back program. Check with your pharmacy or law enforcement to find a location. If you cannot return the medication, check the label or package insert to see if the medication should be thrown out in the garbage or  flushed down the toilet. If you are not sure, ask your care team. If it is safe to put it in the trash, take the medication out of the container. Mix the medication with cat litter, dirt, coffee grounds, or other unwanted substance. Seal the mixture in a bag or container. Put it in the trash. NOTE: This sheet is a summary. It may not cover all possible information. If you have questions about this medicine, talk to your doctor, pharmacist, or health care provider.  2024 Elsevier/Gold Standard (2021-05-19 00:00:00) Rosuvastatin Tablets What is this medication? ROSUVASTATIN (roe SOO va sta tin) treats high cholesterol and reduces the risk of heart attack and stroke. It works by decreasing bad cholesterol and fats (such as LDL, triglycerides) and increasing good cholesterol (HDL) in your blood. It belongs to  a group of medications called statins. Changes to diet and exercise are often combined with this medication. This medicine may be used for other purposes; ask your health care provider or pharmacist if you have questions. COMMON BRAND NAME(S): Crestor What should I tell my care team before I take this medication? They need to know if you have any of these conditions: Diabetes Frequently drink alcohol Kidney disease Liver disease Muscle cramps, pain Stroke Thyroid disease An unusual or allergic reaction to rosuvastatin, other medications, foods, dyes, or preservatives Pregnant or trying to get pregnant Breastfeeding How should I use this medication? Take this medication by mouth with a glass of water. Follow the directions on the prescription label. You can take it with or without food. If it upsets your stomach, take it with food. Do not cut, crush or chew this medication. Swallow the tablets whole. Take your medication at regular intervals. Do not take it more often than directed. Take antacids that have a combination of aluminum and magnesium hydroxide in them at a different time of day  than this medication. Take these products 2 hours AFTER this medication. Talk to your care team about the use of this medication in children. While this medication may be prescribed for children as young as 7 for selected conditions, precautions do apply. Overdosage: If you think you have taken too much of this medicine contact a poison control center or emergency room at once. NOTE: This medicine is only for you. Do not share this medicine with others. What if I miss a dose? If you miss a dose, take it as soon as you can. If your next dose is to be taken in less than 12 hours, then do not take the missed dose. Take the next dose at your regular time. Do not take double or extra doses. What may interact with this medication? Do not take this medication with any of the following: Supplements like red yeast rice This medication may also interact with the following: Alcohol Antacids containing aluminum hydroxide and magnesium hydroxide Cyclosporine Other medications for high cholesterol Some medications for HIV infection Warfarin This list may not describe all possible interactions. Give your health care provider a list of all the medicines, herbs, non-prescription drugs, or dietary supplements you use. Also tell them if you smoke, drink alcohol, or use illegal drugs. Some items may interact with your medicine. What should I watch for while using this medication? Visit your care team for regular checks on your progress. Tell your care team if your symptoms do not start to get better or if they get worse. Your care team may tell you to stop taking this medication if you develop muscle problems. If your muscle problems do not go away after stopping this medication, contact your care team. Talk to your care team if you may be pregnant. Serious birth defects can occur if you take this medication during pregnancy. Talk to your care team before breastfeeding. Changes to your treatment plan may be  needed. This medication may increase blood sugar. Ask your care team if changes in diet or medications are needed if you have diabetes. If you are going to need surgery or other procedure, tell your care team that you are using this medication. Taking this medication is only part of a total heart healthy program. Ask your care team if there are other changes you can make to improve your overall health. What side effects may I notice from receiving this medication? Side effects that  you should report to your care team as soon as possible: Allergic reactions--skin rash, itching, hives, swelling of the face, lips, tongue, or throat High blood sugar (hyperglycemia)--increased thirst or amount of urine, unusual weakness, fatigue, blurry vision Liver injury--right upper belly pain, loss of appetite, nausea, light-colored stool, dark yellow or brown urine, yellowing skin or eyes, unusual weakness, fatigue Muscle injury--unusual weakness, fatigue, muscle pain, dark yellow or brown urine, decrease in amount of urine Redness, blistering, peeling or loosening of the skin, including inside the mouth Side effects that usually do not require medical attention (report to your care team if they continue or are bothersome): Fatigue Headache Nausea Stomach pain This list may not describe all possible side effects. Call your doctor for medical advice about side effects. You may report side effects to FDA at 1-800-FDA-1088. Where should I keep my medication? Keep out of the reach of children and pets. Store between 20 and 25 degrees C (68 and 77 degrees F). Get rid of any unused medication after the expiration date. To get rid of medications that are no longer needed or have expired: Take the medication to a medication take-back program. Check with your pharmacy or law enforcement to find a location. If you cannot return the medication, check the label or package insert to see if the medication should be thrown  out in the garbage or flushed down the toilet. If you are not sure, ask your care team. If it is safe to put it in the trash, take the medication out of the container. Mix the medication with cat litter, dirt, coffee grounds, or other unwanted substance. Seal the mixture in a bag or container. Put it in the trash. NOTE: This sheet is a summary. It may not cover all possible information. If you have questions about this medicine, talk to your doctor, pharmacist, or health care provider.  2024 Elsevier/Gold Standard (2021-08-27 00:00:00) Galcanezumab Injection What is this medication? GALCANEZUMAB (gal ka NEZ ue mab) prevents migraines. It works by blocking a substance in the body that causes migraines. It may also be used to treat cluster headaches. It is a monoclonal antibody. This medicine may be used for other purposes; ask your health care provider or pharmacist if you have questions. COMMON BRAND NAME(S): Emgality What should I tell my care team before I take this medication? They need to know if you have any of these conditions: An unusual or allergic reaction to galcanezumab, other medications, foods, dyes, or preservatives Pregnant or trying to get pregnant Breast-feeding How should I use this medication? This medication is injected under the skin. You will be taught how to prepare and give it. Take it as directed on the prescription label. Keep taking it unless your care team tells you to stop. It is important that you put your used needles and syringes in a special sharps container. Do not put them in a trash can. If you do not have a sharps container, call your pharmacist or care team to get one. Talk to your care team about the use of this medication in children. Special care may be needed. Overdosage: If you think you have taken too much of this medicine contact a poison control center or emergency room at once. NOTE: This medicine is only for you. Do not share this medicine with  others. What if I miss a dose? If you miss a dose, take it as soon as you can. If it is almost time for your next dose, take only  that dose. Do not take double or extra doses. What may interact with this medication? Interactions are not expected. This list may not describe all possible interactions. Give your health care provider a list of all the medicines, herbs, non-prescription drugs, or dietary supplements you use. Also tell them if you smoke, drink alcohol, or use illegal drugs. Some items may interact with your medicine. What should I watch for while using this medication? Visit your care team for regular checks on your progress. Tell your care team if your symptoms do not start to get better or if they get worse. What side effects may I notice from receiving this medication? Side effects that you should report to your care team as soon as possible: Allergic reactions or angioedema--skin rash, itching or hives, swelling of the face, eyes, lips, tongue, arms, or legs, trouble swallowing or breathing Side effects that usually do not require medical attention (report to your care team if they continue or are bothersome): Pain, redness, or irritation at injection site This list may not describe all possible side effects. Call your doctor for medical advice about side effects. You may report side effects to FDA at 1-800-FDA-1088. Where should I keep my medication? Keep out of the reach of children and pets. Store in a refrigerator or at room temperature between 20 and 25 degrees C (68 and 77 degrees F). Refrigeration (preferred): Store in the refrigerator. Do not freeze. Keep in the original container until you are ready to take it. Remove the dose from the carton about 30 minutes before it is time for you to use it. If the dose is not used, it may be stored in original container at room temperature for 7 days. Get rid of any unused medication after the expiration date. Room Temperature: This  medication may be stored at room temperature for up to 7 days. Keep it in the original container. Protect from light until time of use. If it is stored at room temperature, get rid of any unused medication after 7 days or after it expires, whichever is first. To get rid of medications that are no longer needed or have expired: Take the medication to a medication take-back program. Check with your pharmacy or law enforcement to find a location. If you cannot return the medication, ask your pharmacist or care team how to get rid of this medication safely. NOTE: This sheet is a summary. It may not cover all possible information. If you have questions about this medicine, talk to your doctor, pharmacist, or health care provider.  2024 Elsevier/Gold Standard (2021-05-24 00:00:00) Candesartan Tablets What is this medication? CANDESARTAN (kan des AR tan) treats high blood pressure and heart failure. It works by relaxing blood vessels, which decreases the amount of work the heart has to do. It belongs to a group of medications called ARBs. This medicine may be used for other purposes; ask your health care provider or pharmacist if you have questions. COMMON BRAND NAME(S): Atacand What should I tell my care team before I take this medication? They need to know if you have any of these conditions: Heart failure If you are on a special diet, such as a low salt diet Kidney or liver disease An unusual or allergic reaction to candesartan, other medications, foods, dyes, or preservatives Pregnant or trying to get pregnant Breast-feeding How should I use this medication? Take this medication by mouth. Take it as directed on the prescription label at the same time every day. You can take  it with or without food. If it upsets your stomach, take it with food. Keep taking it unless your care team tells you to stop. Talk to your care team about the use of this medication in children. While it may be prescribed for  children as young as 1 for selected conditions, precautions do apply. Overdosage: If you think you have taken too much of this medicine contact a poison control center or emergency room at once. NOTE: This medicine is only for you. Do not share this medicine with others. What if I miss a dose? If you miss a dose, take it as soon as you can. If it is almost time for your next dose, take only that dose. Do not take double or extra doses. What may interact with this medication? This medication may interact with the following: Blood pressure medications Diuretics, especially triamterene, spironolactone, or amiloride Lithium NSAIDs, medications for pain and inflammation, like ibuprofen or naproxen Potassium salts or potassium supplements This list may not describe all possible interactions. Give your health care provider a list of all the medicines, herbs, non-prescription drugs, or dietary supplements you use. Also tell them if you smoke, drink alcohol, or use illegal drugs. Some items may interact with your medicine. What should I watch for while using this medication? Visit your care team for regular checks on your progress. Check your blood pressure as directed. Ask your care team what your blood pressure should be and when you should contact them. Call your care team if you notice an irregular or fast heartbeat. Women should inform their care team if they wish to become pregnant or think they might be pregnant. There is a potential for serious side effects to an unborn child, particularly in the second or third trimester. Talk to your care team or pharmacist for more information. You may get drowsy or dizzy. Do not drive, use machinery, or do anything that needs mental alertness until you know how this medication affects you. Do not stand or sit up quickly, especially if you are an older patient. This reduces the risk of dizzy or fainting spells. Avoid salt substitutes unless you are told otherwise  by your care team. Do not treat yourself for coughs, colds, or pain while you are taking this medication without asking your care team for advice. Some ingredients may increase your blood pressure. What side effects may I notice from receiving this medication? Side effects that you should report to your care team as soon as possible: Allergic reactions--skin rash, itching, hives, swelling of the face, lips, tongue, or throat High potassium level--muscle weakness, fast or irregular heartbeat Kidney injury--decrease in the amount of urine, swelling of the ankles, hands, or feet Low blood pressure--dizziness, feeling faint or lightheaded, blurry vision Side effects that usually do not require medical attention (report to your care team if they continue or are bothersome): Back pain Dizziness Fatigue Headache Runny or stuffy nose Sore throat This list may not describe all possible side effects. Call your doctor for medical advice about side effects. You may report side effects to FDA at 1-800-FDA-1088. Where should I keep my medication? Keep out of the reach of children and pets. Store at room temperature below 30 degrees C (86 degrees F). Do not freeze. Throw away any unused medication after the expiration date. NOTE: This sheet is a summary. It may not cover all possible information. If you have questions about this medicine, talk to your doctor, pharmacist, or health care provider.  2024  Elsevier/Gold Standard (2021-03-10 00:00:00)

## 2023-01-20 NOTE — Progress Notes (Signed)
[facility]    Provider:  Dr Lucia Gaskins Requesting Provider: Sonny Masters, FNP Primary Care Provider:  Sonny Masters, FNP  CC:  migraines  HPI:  [name] is a 37 y.o. female here as requested by Sonny Masters, FNP for migraines started at 12 and went to neurologist then and has tried multiple medications throughout the year every one > 3 months and has been ineffective or partially effective. has Bipolar affective disorder, currently depressed, moderate (HCC); Borderline personality disorder (HCC); Hyperlipidemia; Morbid obesity (HCC); Bipolar 1 disorder (HCC); History of bilateral tubal ligation; S/P C-section; Vitamin D deficiency; Hidradenitis suppurativa; and Migraines on their problem list.   She sees kaleidoscope, rainbows, tunnel vison before the migraine pain. They start in the occipital region, can be unilateral and radiates to behind the eyes, throbbing/pulsating/pounding/severe, naiuse and vomoiting, dizziness, light and sound sensitivity, hurts to move, neck and shoulder very tight, a dark quiet room helps, hurts to move. Unknwon triggers Can happen even with driving and has to get to home quickly before pulling out. She had worsening during pregnancy and had to go the emergency room. In 2017 and had imaging but nit MRI of the brain. She has had hemiplegic migraines. Over the last year, she has daily headaches. 15 moderate to severe migraines and 5 of those are so sever she can;t get out the bed, care for her children, devasting to her life quality. She can snore, she wakes up with headaches, she is fatigues. Can last 24 houra and she had one that lasted 5 days. No other focal neurologic deficits, associated symptoms, inciting events or modifiable factors.  Reviewed notes, labs and imaging from outside physicians, which showed  Meds tried > 3 months: imitrex, fioricet, propranol and b beta blockas are contraindicated due to asthma, doxycline(TCA), gabapentin,  topamax, lamictal, trazone, maxal, relpax, venlafaxine and cymbalta, seroquel, depakote,   2017:   CLINICAL DATA:  37 year old female with right face and upper extremity numbness. Speech and visual disturbance. Initial encounter.   EXAM: MRA HEAD WITHOUT CONTRAST   TECHNIQUE: Angiographic images of the Circle of Willis were obtained using MRA technique without intravenous contrast.   COMPARISON:  Intracranial MRA 1202 hours today. Brain MRI 09/16/2015.   Neck MRA from today reported separately.   FINDINGS: Antegrade flow in codominant distal vertebral arteries which are tortuous but otherwise normal to the vertebrobasilar junction. Normal right PICA and dominant appearing left AICA origins. Mildly tortuous proximal basilar artery. Otherwise normal basilar artery. Normal SCA and PCA origins. Posterior communicating arteries are diminutive or absent. Bilateral PCA branches are normal.   Antegrade flow in both ICA siphons. No siphon stenosis. Ophthalmic artery origins are normal. Carotid termini are normal. MCA and ACA origins are normal. Anterior communicating artery and visualized ACA branches are within normal limits. MCA M1 segments, bifurcations, and visualized MCA branches are within normal limits. Incidental are early right M1 bifurcation (normal variant).   No intracranial mass effect or ventriculomegaly. Normal cerebral volume.   IMPRESSION: Negative intracranial MRA. MRV:  IMPRESSION: Negative intracranial MRV; no evidence of dural sinus thrombosis.    2021:CT cervical and CT head CLINICAL DATA:  Trauma.  Neck pain.   EXAM: CT HEAD WITHOUT CONTRAST   CT CERVICAL SPINE WITHOUT CONTRAST   TECHNIQUE: Multidetector CT imaging of the head and cervical spine was performed following the standard protocol without intravenous contrast. Multiplanar CT image reconstructions of the cervical spine were also generated.   COMPARISON:  Head MRI  report from 09/16/2015  (images not available)   FINDINGS: CT HEAD FINDINGS   Brain: There is no evidence of an acute infarct, intracranial hemorrhage, mass, midline shift, or extra-axial fluid collection. The ventricles and sulci are normal.   Vascular: No hyperdense vessel.   Skull: No fracture or suspicious osseous lesion.   Sinuses/Orbits: Visualized paranasal sinuses and mastoid air cells are clear. Unremarkable orbits.   Other: None.   CT CERVICAL SPINE FINDINGS   Alignment: Cervical spine straightening.  No listhesis.   Skull base and vertebrae: No acute fracture or suspicious osseous lesion.   Soft tissues and spinal canal: No prevertebral fluid or swelling. No visible canal hematoma.   Disc levels:  Unremarkable.   Upper chest: Clear lung apices.   Other: None.   IMPRESSION: 1. Negative head CT. 2. No evidence of acute fracture or subluxation in the cervical spine.  Review of Systems: Patient complains of symptoms per HPI as well as the following symptoms none. Pertinent negatives and positives per HPI. All others negative.   Social History   Socioeconomic History   Marital status: Divorced    Spouse name: Not on file   Number of children: Not on file   Years of education: Not on file   Highest education level: Some college, no degree  Occupational History   Not on file  Tobacco Use   Smoking status: Never   Smokeless tobacco: Never  Vaping Use   Vaping status: Never Used  Substance and Sexual Activity   Alcohol use: No   Drug use: Not Currently    Types: Marijuana    Comment: last used 02/20/13   Sexual activity: Yes    Birth control/protection: None  Other Topics Concern   Not on file  Social History Narrative   Caffiene 300mg  daily   Working : delivery service    Social Determinants of Health   Financial Resource Strain: Low Risk  (09/02/2022)   Overall Financial Resource Strain (CARDIA)    Difficulty of Paying Living Expenses: Not very hard  Food  Insecurity: No Food Insecurity (09/02/2022)   Hunger Vital Sign    Worried About Running Out of Food in the Last Year: Never true    Ran Out of Food in the Last Year: Never true  Transportation Needs: No Transportation Needs (09/02/2022)   PRAPARE - Administrator, Civil Service (Medical): No    Lack of Transportation (Non-Medical): No  Physical Activity: Sufficiently Active (09/02/2022)   Exercise Vital Sign    Days of Exercise per Week: 5 days    Minutes of Exercise per Session: 60 min  Stress: Stress Concern Present (09/02/2022)   [employer] of Occupational Health - Occupational Stress Questionnaire    Feeling of Stress : Very much  Social Connections: Moderately Isolated (09/02/2022)   Social Connection and Isolation Panel [NHANES]    Frequency of Communication with Friends and Family: More than three times a week    Frequency of Social Gatherings with Friends and Family: Once a week    Attends Religious Services: Never    Database administrator or Organizations: No    Attends Engineer, structural: Not on file    Marital Status: Living with partner  Intimate Partner Violence: Not on file    Family History  Problem Relation Age of Onset   Migraines Mother    Hypertension Mother    COPD Mother    Healthy Father    Migraines Maternal Grandmother  Stroke Maternal Grandmother    Diabetes Maternal Grandfather    Hypertension Maternal Grandfather     Past Medical History:  Diagnosis Date   Borderline personality disorder (HCC)    Hyperlipidemia 09/25/2021   Hypertension in pregnancy    Knee pain    Nephrolithiasis    Postpartum depression    Preeclampsia     Patient Active Problem List   Diagnosis Date Noted   Hidradenitis suppurativa 10/05/2022   Vitamin D deficiency 09/14/2022   Morbid obesity (HCC) 09/29/2021   Borderline personality disorder (HCC) 09/27/2021   Hyperlipidemia 09/25/2021   Bipolar affective disorder, currently depressed,  moderate (HCC) 03/22/2016   S/P C-section 03/22/2016   History of bilateral tubal ligation 02/25/2016   Migraines 10/21/2015   Bipolar 1 disorder (HCC) 09/01/2005    Past Surgical History:  Procedure Laterality Date   APPENDECTOMY     CESAREAN SECTION     CYSTOSCOPY/URETEROSCOPY/HOLMIUM LASER/STENT PLACEMENT Right 04/16/2021   Procedure: CYSTOSCOPY RIGHT RETROGRADE PYELOGRAM  URETEROSCOPY/HOLMIUM LASER/STENT PLACEMENT;  Surgeon: Bjorn Pippin, MD;  Location: [facility];  Service: Urology;  Laterality: Right;   HOLMIUM LASER APPLICATION Right 04/16/2021   Procedure: HOLMIUM LASER APPLICATION;  Surgeon: Bjorn Pippin, MD;  Location: [facility];  Service: Urology;  Laterality: Right;   KIDNEY STONE SURGERY  04/16/2021   r-kidney   KNEE SURGERY Right    scoped x2   WISDOM TOOTH EXTRACTION  2010    Current Outpatient Medications  Medication Sig Dispense Refill   acetaminophen-caffeine (EXCEDRIN TENSION HEADACHE) 500-65 MG TABS per tablet Take 2 tablets by mouth 3 (three) times daily as needed. 60 tablet 0   albuterol (VENTOLIN HFA) 108 (90 Base) MCG/ACT inhaler Inhale 1-2 puffs into the lungs every 6 (six) hours as needed for wheezing or shortness of breath. 18 g 0   candesartan (ATACAND) 4 MG tablet Take 1 tablet (4 mg total) by mouth daily. 30 tablet 4   clindamycin (CLEOCIN T) 1 % SWAB Apply topically 2 (two) times daily.     doxycycline (ADOXA) 100 MG tablet Take 100 mg by mouth 2 (two) times daily.     fluconazole (DIFLUCAN) 150 MG tablet Take 1 tablet PO once. Repeat in 3 days if needed. 2 tablet 0   gabapentin (NEURONTIN) 100 MG capsule Take 2 capsules (200 mg total) by mouth 3 (three) times daily as needed (anxiety). 180 capsule 2   Galcanezumab-gnlm (EMGALITY) 120 MG/ML SOAJ Inject 120 mg into the skin every 30 (thirty) days. 1.12 mL 11   lamoTRIgine (LAMICTAL) 100 MG tablet Take 1 tablet (100 mg total) by mouth daily. 30 tablet 2   nitrofurantoin,  macrocrystal-monohydrate, (MACROBID) 100 MG capsule Take 1 capsule (100 mg total) by mouth 2 (two) times daily. 10 capsule 0   nystatin cream (MYCOSTATIN) Apply 1 Application topically 2 (two) times daily. (Patient taking differently: Apply 1 Application topically 2 (two) times daily as needed.) 30 g 0   Rimegepant Sulfate (NURTEC) 75 MG TBDP Take 1 tablet (75 mg total) by mouth daily as needed. For migraines. Take as close to onset of migraine as possible. One daily maximum. 10 tablet 6   rosuvastatin (CRESTOR) 40 MG tablet Take 1 tablet (40 mg total) by mouth daily. 90 tablet 4   Semaglutide-Weight Management 0.25 MG/0.5ML SOAJ Inject 0.25 mg into the skin once a week for 28 days. 2 mL 0   traZODone (DESYREL) 100 MG tablet Take 1 tablet (100 mg total) by mouth at bedtime.  30 tablet 2   Vitamin D, Ergocalciferol, (DRISDOL) 1.25 MG (50000 UNIT) CAPS capsule Take 1 capsule (50,000 Units total) by mouth every 7 (seven) days. 5 capsule 3   No current facility-administered medications for this visit.    Allergies as of 01/20/2023 - Review Complete 01/19/2023  Allergen Reaction Noted   Sodium hypochlorite Shortness Of Breath 10/15/2015   Keflex [cephalexin]  09/17/2015   Latex  09/17/2015   Skin bleaching [hydroquinone]  06/05/2019    Vitals: BP (!) 143/96   Pulse 77   Ht 5\' 6"  (1.676 m)   Wt 247 lb 6.4 oz (112.2 kg)   BMI 39.93 kg/m  Last Weight:  Wt Readings from Last 1 Encounters:  01/20/23 247 lb 6.4 oz (112.2 kg)   Last Height:   Ht Readings from Last 1 Encounters:  01/20/23 5\' 6"  (1.676 m)     Physical exam: Exam: Gen: NAD, conversant, well nourised, obese, well groomed                     CV: RRR, no MRG. No Carotid Bruits. No peripheral edema, warm, nontender Eyes: Conjunctivae clear without exudates or hemorrhage  Neuro: Detailed Neurologic Exam  Speech:    Speech is normal; fluent and spontaneous with normal comprehension.  Cognition:    The patient is oriented to  person, place, and time;     recent and remote memory intact;     language fluent;     normal attention, concentration,     fund of knowledge Cranial Nerves:    The pupils are equal, round, and reactive to light. The fundi are normal . Extraocular movements are intact. Trigeminal sensation is intact and the muscles of mastication are normal. The face is symmetric. The palate elevates in the midline. Hearing intact. Voice is normal. Shoulder shrug is normal. The tongue has normal motion without fasciculations.   Coordination: nml  Gait: nml  Motor Observation:    No asymmetry, no atrophy, and no involuntary movements noted. Tone:    Normal muscle tone.    Posture:    Posture is normal. normal erect    Strength:    Strength is V/V in the upper and lower limbs.      Sensation: intact to LT     Reflex Exam:  DTR's:    Deep tendon reflexes in the upper and lower extremities are normal bilaterally.   Toes:    The toes are downgoing bilaterally.   Clonus:    Clonus is absent.    Assessment/Plan:  Patient with chronic migraines. Morbid obesity  Prevention Migraine with and without aura: Will try candesartan, start emgality for migraines with and without aura daily headaches and 15 total migraine days a week, She had tubal ligation no need for pregnancy test Migraines with aure: She has 5 migraine day a month with aura. Will try to get Nurtec or Bernita Raisin, she has tried multiple triptans and they are  Sleep study: Over the last year, she has daily headaches. 15 moderate to severe migraines and 5 of those are so sever she can;t get out the bed, care for her children, devasting to her life quality. She can snore, she wakes up with headaches, she is fatigues. Partner says she snores, she dozes off on the couch, and they tell her she snores even sitting whem falling asleep in front of the TV with frequent awakwining at night. BMI almost 40. BMI almost 40: Recommend Wegovy. Medicaid  recently said  they will approve. She has tried a formal weight loss program for > 3 months and has not been able to lost more than 5% of her body weight. She has multiple reasons to address her weight including migraines(obesity can be a factor in chronification), Losing weight is recommended, she has HTN and Hypercholerolemia in addition to these risk factors migraine with aura chronification risk is increased with obesity, migraine with aura also increased stroke risk.  She has had multiple imaging in the past but low threshold to repeat MRI brain HLD: increase creator due to stroke risk in migriane with aura need to address all modifiable risk favtors like HYN and HLD Test HgbA1c  Discussed:  There is increased risk for stroke in women with migraine with aura and a contraindication for the combined contraceptive pill for use by women who have migraine with aura. The risk for women with migraine without aura is lower. However other risk factors like smoking are far more likely to increase stroke risk than migraine. There is a recommendation for no smoking and for the use of OCPs without estrogen such as progestogen only pills particularly for women with migraine with aura.Marland Kitchen People who have migraine headaches with auras may be 3 times more likely to have a stroke caused by a blood clot, compared to migraine patients who don't see auras. Women who take hormone-replacement therapy may be 30 percent more likely to suffer a clot-based stroke than women not taking medication containing estrogen. Other risk factors like smoking and high blood pressure may be  much more important. And stroke is still a rare complication due to migraine aura and is controversial and lower doses may not cause a risk.  Orders Placed This Encounter  Procedures   Hemoglobin A1c   Ambulatory referral to Sleep Studies   Meds ordered this encounter  Medications   Semaglutide-Weight Management 0.25 MG/0.5ML SOAJ    Sig: Inject 0.25  mg into the skin once a week for 28 days.    Dispense:  2 mL    Refill:  0   rosuvastatin (CRESTOR) 40 MG tablet    Sig: Take 1 tablet (40 mg total) by mouth daily.    Dispense:  90 tablet    Refill:  4   candesartan (ATACAND) 4 MG tablet    Sig: Take 1 tablet (4 mg total) by mouth daily.    Dispense:  30 tablet    Refill:  4   Galcanezumab-gnlm (EMGALITY) 120 MG/ML SOAJ    Sig: Inject 120 mg into the skin every 30 (thirty) days.    Dispense:  1.12 mL    Refill:  11   Rimegepant Sulfate (NURTEC) 75 MG TBDP    Sig: Take 1 tablet (75 mg total) by mouth daily as needed. For migraines. Take as close to onset of migraine as possible. One daily maximum.    Dispense:  10 tablet    Refill:  6    Cc: Rakes, Doralee Albino, FNP,  Rakes, Doralee Albino, FNP  Naomie Dean, MD  [facility] [address] [place], [place] [address]  Phone [phone] Fax [phone]  I spent over 60 minutes of face-to-face and non-face-to-face time with patient on the  1. Chronic migraine without aura, with intractable migraine, so stated, with status migrainosus   2. Morning headache   3. Excessive daytime sleepiness   4. Morbid obesity (HCC)   5. Primary hypertension   6. Hyperlipidemia, unspecified hyperlipidemia type   7. Migraine  with aura and with status migrainosus, not intractable   8. Elevated glucose    diagnosis.  This included previsit chart review, lab review, study review, order entry, electronic health record documentation, patient education on the different diagnostic and therapeutic options, counseling and coordination of care, risks and benefits of management, compliance, or risk factor reduction

## 2023-01-21 ENCOUNTER — Encounter: Payer: Self-pay | Admitting: Neurology

## 2023-01-21 LAB — HEMOGLOBIN A1C
Est. average glucose Bld gHb Est-mCnc: 103 mg/dL
Hgb A1c MFr Bld: 5.2 % (ref 4.8–5.6)

## 2023-01-23 ENCOUNTER — Encounter: Payer: Self-pay | Admitting: Neurology

## 2023-01-24 DIAGNOSIS — N898 Other specified noninflammatory disorders of vagina: Secondary | ICD-10-CM | POA: Diagnosis not present

## 2023-01-24 DIAGNOSIS — Z20822 Contact with and (suspected) exposure to covid-19: Secondary | ICD-10-CM | POA: Diagnosis not present

## 2023-01-24 DIAGNOSIS — B349 Viral infection, unspecified: Secondary | ICD-10-CM | POA: Diagnosis not present

## 2023-01-24 DIAGNOSIS — R300 Dysuria: Secondary | ICD-10-CM | POA: Diagnosis not present

## 2023-01-24 DIAGNOSIS — B3731 Acute candidiasis of vulva and vagina: Secondary | ICD-10-CM | POA: Diagnosis not present

## 2023-01-25 ENCOUNTER — Other Ambulatory Visit (HOSPITAL_COMMUNITY): Payer: Self-pay

## 2023-01-25 ENCOUNTER — Telehealth: Payer: Self-pay

## 2023-01-25 NOTE — Telephone Encounter (Signed)
Pharmacy Patient Advocate Encounter   Received notification from CoverMyMeds that prior authorization for Nurtec 75MG  dispersible tablets is required/requested.   Insurance verification completed.   The patient is insured through [facility] .   Per test claim: PA required; PA submitted to [employer] via CoverMyMeds Key/confirmation #/EOC [id number] Status is pending

## 2023-01-25 NOTE — Telephone Encounter (Signed)
Pharmacy Patient Advocate Encounter   Received notification from CoverMyMeds that prior authorization for Candesartan Cilexetil 4MG  tablets is required/requested.   Insurance verification completed.   The patient is insured through [facility] .   Per test claim: PA required; PA started via CoverMyMeds. KEY [id number] . Waiting for clinical questions to populate.

## 2023-01-25 NOTE — Telephone Encounter (Signed)
Pharmacy Patient Advocate Encounter   Received notification from CoverMyMeds that prior authorization for [facility] 0.25MG /0.5ML auto-injectors is required/requested.   Insurance verification completed.   The patient is insured through [facility] .   Per test claim: PA required; PA started via CoverMyMeds. KEY [id number] . Waiting for clinical questions to populate.

## 2023-01-25 NOTE — Telephone Encounter (Signed)
Received a PA request via CMM Key: [id number]  Will PT need the loading dose?

## 2023-01-26 ENCOUNTER — Other Ambulatory Visit (HOSPITAL_COMMUNITY): Payer: Self-pay

## 2023-01-26 NOTE — Telephone Encounter (Signed)
Clinical questions have been submitted-awaiting determination

## 2023-01-26 NOTE — Telephone Encounter (Signed)
Please advise 

## 2023-01-26 NOTE — Telephone Encounter (Signed)
I spoke with the patient.  She states yes she is and will continue lifestyle modifications.  She states she does low impact exercises, cardio, walking daily.  She states she is eating healthier by eating more chicken and vegetables, avoiding red meat, bread, junk food and heavy starches.

## 2023-01-26 NOTE — Telephone Encounter (Signed)
Pharmacy Patient Advocate Encounter   Received notification from CoverMyMeds that prior authorization for Emgality 120MG /ML auto-injectors (migraine) is required/requested.   Insurance verification completed.   The patient is insured through [facility] .   Per test claim: PA required; PA submitted to [employer] via CoverMyMeds Key/confirmation #/EOC [id number] Status is pending

## 2023-01-26 NOTE — Telephone Encounter (Signed)
I spoke with patient. She states she already received the 2 Emgality pens already so we only need approval for the maintenance of 1 pen per 30 days.

## 2023-01-26 NOTE — Telephone Encounter (Signed)
Clinical questions have been submitted-awaiting determination.

## 2023-01-27 ENCOUNTER — Other Ambulatory Visit (HOSPITAL_COMMUNITY): Payer: Self-pay

## 2023-01-27 NOTE — Telephone Encounter (Signed)
Pharmacy Patient Advocate Encounter  Received notification from [facility] that Prior Authorization for Nurtec 75MG  dispersible tablets has been DENIED.  Full denial letter will be uploaded to the media tab. See denial reason below.   PA #/Case ID/Reference #: PA Case ID #: [id number]

## 2023-01-27 NOTE — Telephone Encounter (Signed)
Pharmacy Patient Advocate Encounter  Received notification from [facility] that Prior Authorization for Emgality 120MG /ML auto-injectors (migraine) has been APPROVED from 01/26/2023 to 04/28/2023. Ran test claim, Copay is $4.00. This test claim was processed through [facility]- copay amounts may vary at other pharmacies due to pharmacy/plan contracts, or as the patient moves through the different stages of their insurance plan.   PA #/Case ID/Reference #: N/A

## 2023-01-27 NOTE — Telephone Encounter (Signed)
Pharmacy Patient Advocate Encounter  Received notification from [facility] that Prior Authorization for Candesartan Cilexetil 4MG  tablets has been DENIED.  Full denial letter will be uploaded to the media tab. See denial reason below.   PA #/Case ID/Reference #: PA Case ID #: [id number]

## 2023-01-30 NOTE — Telephone Encounter (Signed)
He can get generic through goodrx for a low price thanks

## 2023-01-30 NOTE — Telephone Encounter (Signed)
Unfortunately will have to wait for emgality to start working and then we can try again for ubrelvy, I did discuss this with patient and said we would try but if not approved we would have to continue with the emgality for a few months luckily it may help quickly but we have to wait thanks

## 2023-01-30 NOTE — Telephone Encounter (Signed)
Clinical questions submitted

## 2023-01-30 NOTE — Telephone Encounter (Signed)
Previous PA expired- Resubmitted-awaiting clinical questions-(Key: [id number])

## 2023-01-31 ENCOUNTER — Ambulatory Visit: Payer: Medicaid Other | Admitting: Neurology

## 2023-01-31 NOTE — Telephone Encounter (Signed)
Addressed in other encounter 

## 2023-01-31 NOTE — Telephone Encounter (Signed)
I called pt and relayed that since her insurance denied the nurtec, emagility hopefully will start working soon for her (may be couple months).  She stated that she feels that it has made a difference for her already,  migraines not as severe as before.  She appreciated call back and understood about denial from her insurance.

## 2023-02-04 ENCOUNTER — Other Ambulatory Visit: Payer: Self-pay | Admitting: Family Medicine

## 2023-02-04 DIAGNOSIS — E559 Vitamin D deficiency, unspecified: Secondary | ICD-10-CM

## 2023-02-09 NOTE — Telephone Encounter (Signed)
PA expired-had to resubmit- Pharmacy Patient Advocate Encounter   Received notification from Physician's Office that prior authorization for Wegovy 0.25MG /0.5ML auto-injectors is required/requested.   Insurance verification completed.   The patient is insured through [facility] .   Per test claim: PA required; PA started via CoverMyMeds. KEY [id number] . Waiting for clinical questions to populate.

## 2023-02-10 ENCOUNTER — Other Ambulatory Visit (HOSPITAL_COMMUNITY): Payer: Self-pay

## 2023-02-10 NOTE — Telephone Encounter (Signed)
Pharmacy Patient Advocate Encounter  Received notification from [facility] that Prior Authorization for Wegovy 0.25MG /0.5ML auto-injectors has been APPROVED from 02/09/2023 to 08/09/2023. Ran test claim, Copay is $4.00. This test claim was processed through [facility]- copay amounts may vary at other pharmacies due to pharmacy/plan contracts, or as the patient moves through the different stages of their insurance plan.   PA #/Case ID/Reference #: N/A

## 2023-02-11 ENCOUNTER — Telehealth: Payer: Medicaid Other | Admitting: Family

## 2023-02-11 DIAGNOSIS — J069 Acute upper respiratory infection, unspecified: Secondary | ICD-10-CM

## 2023-02-11 MED ORDER — FLUTICASONE PROPIONATE 50 MCG/ACT NA SUSP
2.0000 | Freq: Every day | NASAL | 6 refills | Status: DC
Start: 2023-02-11 — End: 2023-02-22

## 2023-02-11 MED ORDER — CETIRIZINE HCL 10 MG PO TABS
10.0000 mg | ORAL_TABLET | Freq: Every day | ORAL | 1 refills | Status: DC
Start: 2023-02-11 — End: 2023-02-22

## 2023-02-11 MED ORDER — BENZONATATE 100 MG PO CAPS
100.0000 mg | ORAL_CAPSULE | Freq: Three times a day (TID) | ORAL | 0 refills | Status: DC | PRN
Start: 2023-02-11 — End: 2023-02-22

## 2023-02-11 NOTE — Progress Notes (Signed)

## 2023-02-13 DIAGNOSIS — R070 Pain in throat: Secondary | ICD-10-CM | POA: Diagnosis not present

## 2023-02-13 DIAGNOSIS — Z20822 Contact with and (suspected) exposure to covid-19: Secondary | ICD-10-CM | POA: Diagnosis not present

## 2023-02-13 DIAGNOSIS — R051 Acute cough: Secondary | ICD-10-CM | POA: Diagnosis not present

## 2023-02-13 DIAGNOSIS — B9689 Other specified bacterial agents as the cause of diseases classified elsewhere: Secondary | ICD-10-CM | POA: Diagnosis not present

## 2023-02-13 DIAGNOSIS — J988 Other specified respiratory disorders: Secondary | ICD-10-CM | POA: Diagnosis not present
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
## 2023-02-22 ENCOUNTER — Ambulatory Visit: Payer: Medicaid Other | Admitting: Neurology

## 2023-02-22 VITALS — BP 139/87 | HR 78 | Ht 66.0 in | Wt 246.0 lb

## 2023-02-22 DIAGNOSIS — G43101 Migraine with aura, not intractable, with status migrainosus: Secondary | ICD-10-CM

## 2023-02-22 DIAGNOSIS — Z62810 Personal history of physical and sexual abuse in childhood: Secondary | ICD-10-CM | POA: Diagnosis not present

## 2023-02-22 DIAGNOSIS — G4719 Other hypersomnia: Secondary | ICD-10-CM | POA: Insufficient documentation

## 2023-02-22 DIAGNOSIS — G43711 Chronic migraine without aura, intractable, with status migrainosus: Secondary | ICD-10-CM | POA: Diagnosis not present

## 2023-02-22 DIAGNOSIS — F514 Sleep terrors [night terrors]: Secondary | ICD-10-CM | POA: Insufficient documentation

## 2023-02-22 DIAGNOSIS — F5104 Psychophysiologic insomnia: Secondary | ICD-10-CM | POA: Diagnosis not present

## 2023-02-22 DIAGNOSIS — R519 Headache, unspecified: Secondary | ICD-10-CM | POA: Insufficient documentation
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
## 2023-02-23 ENCOUNTER — Encounter (HOSPITAL_COMMUNITY): Payer: Self-pay | Admitting: Psychiatry

## 2023-02-23 ENCOUNTER — Telehealth (HOSPITAL_COMMUNITY): Payer: Medicaid Other | Admitting: Psychiatry

## 2023-02-23 DIAGNOSIS — F603 Borderline personality disorder: Secondary | ICD-10-CM

## 2023-02-23 DIAGNOSIS — F4310 Post-traumatic stress disorder, unspecified: Secondary | ICD-10-CM

## 2023-02-23 DIAGNOSIS — F411 Generalized anxiety disorder: Secondary | ICD-10-CM

## 2023-02-23 MED ORDER — LAMOTRIGINE 100 MG PO TABS: 150.0000 mg | ORAL_TABLET | Freq: Every day | ORAL | 2 refills | Status: DC

## 2023-02-23 MED ORDER — VORTIOXETINE HBR 5 MG PO TABS: 5.0000 mg | ORAL_TABLET | Freq: Every day | ORAL | 2 refills | Status: DC

## 2023-02-24 ENCOUNTER — Encounter (HOSPITAL_COMMUNITY): Payer: Self-pay

## 2023-02-24 ENCOUNTER — Ambulatory Visit: Payer: Medicaid Other | Admitting: Family Medicine

## 2023-02-24 ENCOUNTER — Encounter: Payer: Self-pay | Admitting: Neurology

## 2023-02-24 ENCOUNTER — Encounter: Payer: Self-pay | Admitting: Family Medicine

## 2023-02-24 VITALS — BP 101/72 | HR 90 | Temp 97.3°F | Ht 66.0 in | Wt 244.6 lb

## 2023-02-24 DIAGNOSIS — M25561 Pain in right knee: Secondary | ICD-10-CM

## 2023-02-24 DIAGNOSIS — Z23 Encounter for immunization: Secondary | ICD-10-CM

## 2023-02-24 DIAGNOSIS — K644 Residual hemorrhoidal skin tags: Secondary | ICD-10-CM | POA: Diagnosis not present

## 2023-02-24 DIAGNOSIS — G8929 Other chronic pain: Secondary | ICD-10-CM | POA: Diagnosis not present

## 2023-02-24 DIAGNOSIS — M238X1 Other internal derangements of right knee: Secondary | ICD-10-CM | POA: Diagnosis not present

## 2023-02-24 MED ORDER — HYDROCORTISONE ACETATE 25 MG RE SUPP: 25.0000 mg | Freq: Two times a day (BID) | RECTAL | 0 refills | Status: AC

## 2023-02-24 MED ORDER — POLYETHYLENE GLYCOL 3350 17 GM/SCOOP PO POWD: 17.0000 g | Freq: Every day | ORAL | 1 refills | Status: DC

## 2023-02-24 NOTE — Progress Notes (Signed)
Subjective:  Patient ID: [name], female    DOB: 19-Jul-1985, 37 y.o.   MRN: [id number]  Patient Care Team: Sonny Masters, FNP as PCP - General (Family Medicine)   Chief Complaint:  Joint Swelling (Right swelling and pain since August 2024. States it has been ongoing pain. ) and Hematuria (X 2 months )   HPI: [name] is a 37 y.o. female presenting on 02/24/2023 for Joint Swelling (Right swelling and pain since August 2024. States it has been ongoing pain. ) and Hematuria (X 2 months )   Discussed the use of AI scribe software for clinical note transcription with the patient, who gave verbal consent to proceed.  History of Present Illness   The patient, with a history of knee dislocation and two prior surgeries, presents with recurrent knee dislocation. The initial surgeries, performed when the patient was [age], involved an arthroscopic procedure to shave the bone and remove torn cartilage, followed by a lateral release to tighten the muscle. The knee remained stable until 2014 when it dislocated during play with the patient's child. At that time, the patient was advised that further treatment would likely involve complete reconstruction, but she opted not to pursue this. Recently, the knee has been consistently dislocating without any new injuries, causing the patient to fall.  In addition to the knee issue, the patient reports a history of hemorrhoids since her last pregnancy. Recently, around September, the hemorrhoids have become more problematic with increased bleeding and two visible hemorrhoids protruding most of the time. The blood is described as bright red. The patient has been managing symptoms with Tucks wipes and has not been taking any fiber supplements. The hemorrhoids have become painful, with one episode of severe pain that disrupted sleep.     Relevant past medical, surgical, family, and social history reviewed and updated as indicated.  Allergies and  medications reviewed and updated. Data reviewed: Chart in Epic.   Past Medical History:  Diagnosis Date   Borderline personality disorder (HCC)    Hyperlipidemia 09/25/2021   Hypertension in pregnancy    Knee pain    Nephrolithiasis    Postpartum depression    Preeclampsia     Past Surgical History:  Procedure Laterality Date   APPENDECTOMY     CESAREAN SECTION     CYSTOSCOPY/URETEROSCOPY/HOLMIUM LASER/STENT PLACEMENT Right 04/16/2021   Procedure: CYSTOSCOPY RIGHT RETROGRADE PYELOGRAM  URETEROSCOPY/HOLMIUM LASER/STENT PLACEMENT;  Surgeon: Bjorn Pippin, MD;  Location: [facility];  Service: Urology;  Laterality: Right;   HOLMIUM LASER APPLICATION Right 04/16/2021   Procedure: HOLMIUM LASER APPLICATION;  Surgeon: Bjorn Pippin, MD;  Location: [facility];  Service: Urology;  Laterality: Right;   KIDNEY STONE SURGERY  04/16/2021   r-kidney   KNEE SURGERY Right    scoped x2   WISDOM TOOTH EXTRACTION  2010    Social History   Socioeconomic History   Marital status: Divorced    Spouse name: Not on file   Number of children: Not on file   Years of education: Not on file   Highest education level: Some college, no degree  Occupational History   Not on file  Tobacco Use   Smoking status: Never   Smokeless tobacco: Never  Vaping Use   Vaping status: Never Used  Substance and Sexual Activity   Alcohol use: No   Drug use: Not Currently    Types: Marijuana    Comment: last used 02/20/13   Sexual  activity: Yes    Birth control/protection: None  Other Topics Concern   Not on file  Social History Narrative   Caffiene 300mg  daily   Working : delivery service    Social Determinants of Health   Financial Resource Strain: High Risk (02/22/2023)   Overall Financial Resource Strain (CARDIA)    Difficulty of Paying Living Expenses: Hard  Food Insecurity: Food Insecurity Present (02/22/2023)   Hunger Vital Sign    Worried About Running Out of Food in  the Last Year: Sometimes true    Ran Out of Food in the Last Year: Sometimes true  Transportation Needs: No Transportation Needs (02/22/2023)   PRAPARE - Administrator, Civil Service (Medical): No    Lack of Transportation (Non-Medical): No  Physical Activity: Sufficiently Active (02/22/2023)   Exercise Vital Sign    Days of Exercise per Week: 5 days    Minutes of Exercise per Session: 30 min  Stress: Stress Concern Present (02/22/2023)   [employer] of Occupational Health - Occupational Stress Questionnaire    Feeling of Stress : Rather much  Social Connections: Unknown (02/22/2023)   Social Connection and Isolation Panel [NHANES]    Frequency of Communication with Friends and Family: More than three times a week    Frequency of Social Gatherings with Friends and Family: Once a week    Attends Religious Services: Never    Database administrator or Organizations: Patient declined    Attends Banker Meetings: Not on file    Marital Status: Living with partner  Intimate Partner Violence: Not on file    Outpatient Encounter Medications as of 02/24/2023  Medication Sig   albuterol (VENTOLIN HFA) 108 (90 Base) MCG/ACT inhaler Inhale 1-2 puffs into the lungs every 6 (six) hours as needed for wheezing or shortness of breath.   candesartan (ATACAND) 4 MG tablet Take 1 tablet (4 mg total) by mouth daily.   clindamycin (CLEOCIN T) 1 % SWAB Apply topically 2 (two) times daily.   doxycycline (VIBRAMYCIN) 100 MG capsule Take 100 mg by mouth daily.   gabapentin (NEURONTIN) 100 MG capsule Take 2 capsules (200 mg total) by mouth 3 (three) times daily as needed (anxiety).   Galcanezumab-gnlm (EMGALITY) 120 MG/ML SOAJ Inject 120 mg into the skin every 30 (thirty) days.   hydrocortisone (ANUSOL-HC) 25 MG suppository Place 1 suppository (25 mg total) rectally 2 (two) times daily for 3 days.   lamoTRIgine (LAMICTAL) 100 MG tablet Take 1.5 tablets (150 mg total) by mouth  daily.   nystatin cream (MYCOSTATIN) Apply 1 Application topically 2 (two) times daily. (Patient taking differently: Apply 1 Application topically 2 (two) times daily as needed.)   polyethylene glycol powder (GLYCOLAX/MIRALAX) 17 GM/SCOOP powder Take 17 g by mouth daily.   rosuvastatin (CRESTOR) 40 MG tablet Take 1 tablet (40 mg total) by mouth daily.   traZODone (DESYREL) 100 MG tablet Take 1 tablet (100 mg total) by mouth at bedtime.   Vitamin D, Ergocalciferol, (DRISDOL) 1.25 MG (50000 UNIT) CAPS capsule TAKE 1 CAPSULE (50,000 UNITS TOTAL) BY MOUTH EVERY 7 (SEVEN) DAYS   vortioxetine HBr (TRINTELLIX) 5 MG TABS tablet Take 1 tablet (5 mg total) by mouth daily. Start on 11/21   WEGOVY 0.25 MG/0.5ML SOAJ Inject 0.25 mg into the skin once a week.   No facility-administered encounter medications on file as of 02/24/2023.    Allergies  Allergen Reactions   Sodium Hypochlorite Shortness Of Breath   Keflex [Cephalexin]  Latex    Skin Bleaching [Hydroquinone]     Facial swelling, SOB    Pertinent ROS per HPI, otherwise unremarkable      Objective:  BP 101/72   Pulse 90   Temp (!) 97.3 F (36.3 C) (Temporal)   Ht 5\' 6"  (1.676 m)   Wt 244 lb 9.6 oz (110.9 kg)   SpO2 96%   BMI 39.48 kg/m    Wt Readings from Last 3 Encounters:  02/24/23 244 lb 9.6 oz (110.9 kg)  02/22/23 246 lb (111.6 kg)  01/20/23 247 lb 6.4 oz (112.2 kg)    Physical Exam Vitals and nursing note reviewed.  Constitutional:      General: She is not in acute distress.    Appearance: Normal appearance. She is obese. She is not ill-appearing, toxic-appearing or diaphoretic.  HENT:     Head: Normocephalic and atraumatic.     Nose: Nose normal.     Mouth/Throat:     Mouth: Mucous membranes are moist.  Eyes:     Conjunctiva/sclera: Conjunctivae normal.     Pupils: Pupils are equal, round, and reactive to light.  Cardiovascular:     Rate and Rhythm: Normal rate and regular rhythm.     Heart sounds: Normal  heart sounds.  Pulmonary:     Effort: Pulmonary effort is normal.     Breath sounds: Normal breath sounds.  Musculoskeletal:     Right upper leg: Normal.     Right knee: Swelling and effusion present. Decreased range of motion. LCL laxity and MCL laxity present.     Right lower leg: Normal. No edema.     Left lower leg: No edema.  Skin:    General: Skin is warm and dry.     Capillary Refill: Capillary refill takes less than 2 seconds.  Neurological:     General: No focal deficit present.     Mental Status: She is alert and oriented to person, place, and time.  Psychiatric:        Mood and Affect: Mood normal.        Behavior: Behavior normal.        Thought Content: Thought content normal.        Judgment: Judgment normal.    Physical Exam   MUSCULOSKELETAL: Knee exhibits swelling, effusion, and loose patella. SKIN: Hemorrhoids present, described as bright red.        Results for orders placed or performed in visit on 01/20/23  Hemoglobin A1c  Result Value Ref Range   Hgb A1c MFr Bld 5.2 4.8 - 5.6 %   Est. average glucose Bld gHb Est-mCnc 103 mg/dL       Pertinent labs & imaging results that were available during my care of the patient were reviewed by me and considered in my medical decision making.  Assessment & Plan:  Alianna was seen today for joint swelling and hematuria.  Diagnoses and all orders for this visit:  Chronic pain of right knee -     Ambulatory referral to Orthopedic Surgery  Joint laxity of right knee -     Ambulatory referral to Orthopedic Surgery  External hemorrhoids -     Ambulatory referral to Gastroenterology -     polyethylene glycol powder (GLYCOLAX/MIRALAX) 17 GM/SCOOP powder; Take 17 g by mouth daily. -     hydrocortisone (ANUSOL-HC) 25 MG suppository; Place 1 suppository (25 mg total) rectally 2 (two) times daily for 3 days.  Need for influenza vaccination -  Flu vaccine trivalent PF, 6mos and  older(Flulaval,Afluria,Fluarix,Fluzone)     Assessment and Plan    Recurrent Patellar Dislocation Chronic recurrent patellar dislocation, previously managed with arthroscopic surgery and lateral release in 2003 and 2004. Condition has worsened since 2014 with frequent dislocations and instability. Current management includes knee brace for stability. Discussed referral to Dr. Madelon Lips for continuity of care and potential surgical intervention. No immediate imaging as orthopedic surgeon likely to order MRI. - Refer to orthopedic surgeon (Dr. Madelon Lips) - Continue wearing knee brace for stability - Defer imaging to orthopedic surgeon for MRI  Hemorrhoids Chronic hemorrhoids, initially noted during pregnancy, worsened since September with increased bleeding and visible external hemorrhoids. Reports significant pain and discomfort. Discussed use of Miralax to soften stools and Anusol suppositories for symptom relief. Referral to GI specialist for further evaluation and potential excision. Advised to seek immediate care if severe pain or thrombosis occurs. - Prescribe Miralax daily to soften stools - Prescribe Anusol suppositories for 3 days - Refer to GI specialist for further evaluation and potential excision - Provide educational materials on hemorrhoid management - Advise to seek immediate care if severe pain or thrombosis occurs  Behavioral Health Management Continues behavioral health services with recent medication adjustments. No specific issues reported. - Continue current behavioral health management  Follow-up - Follow up with orthopedic surgeon within a week if no contact - Follow up with GI specialist as scheduled.          Continue all other maintenance medications.  Follow up plan: Return if symptoms worsen or fail to improve.   Continue healthy lifestyle choices, including diet (rich in fruits, vegetables, and lean proteins, and low in salt and simple carbohydrates) and  exercise (at least 30 minutes of moderate physical activity daily).  Educational handout given for hemorrhoids   The above assessment and management plan was discussed with the patient. The patient verbalized understanding of and has agreed to the management plan. Patient is aware to call the clinic if they develop any new symptoms or if symptoms persist or worsen. Patient is aware when to return to the clinic for a follow-up visit. Patient educated on when it is appropriate to go to the emergency department.   Kari Baars, FNP-C [facility] [phone]

## 2023-02-27 ENCOUNTER — Telehealth (HOSPITAL_COMMUNITY): Payer: Self-pay

## 2023-02-27 NOTE — Telephone Encounter (Signed)
Patients [employer] was approved from 02/27/2023 to 02/27/2024. PA # [id number]

## 2023-02-28 ENCOUNTER — Encounter (INDEPENDENT_AMBULATORY_CARE_PROVIDER_SITE_OTHER): Payer: Self-pay | Admitting: *Deleted

## 2023-02-28 ENCOUNTER — Telehealth: Payer: Self-pay | Admitting: Neurology

## 2023-02-28 DIAGNOSIS — M25561 Pain in right knee: Secondary | ICD-10-CM | POA: Diagnosis not present

## 2023-02-28 NOTE — Telephone Encounter (Signed)
[facility] [employer] pending faxed notes

## 2023-02-28 NOTE — Telephone Encounter (Signed)
[facility] [employer] no auth req via fax form

## 2023-03-02 ENCOUNTER — Other Ambulatory Visit: Payer: Self-pay | Admitting: Neurology

## 2023-03-02 DIAGNOSIS — E559 Vitamin D deficiency, unspecified: Secondary | ICD-10-CM

## 2023-03-02 MED ORDER — WEGOVY 0.5 MG/0.5ML ~~LOC~~ SOAJ: 0.5000 mg | SUBCUTANEOUS | 2 refills | Status: DC

## 2023-03-07 ENCOUNTER — Ambulatory Visit: Payer: Medicaid Other | Admitting: Physical Therapy

## 2023-03-08 ENCOUNTER — Encounter: Payer: Self-pay | Admitting: Physical Therapy

## 2023-03-08 ENCOUNTER — Ambulatory Visit: Payer: Medicaid Other | Attending: Orthopedic Surgery | Admitting: Physical Therapy

## 2023-03-08 ENCOUNTER — Other Ambulatory Visit: Payer: Self-pay

## 2023-03-08 DIAGNOSIS — G8929 Other chronic pain: Secondary | ICD-10-CM | POA: Diagnosis not present

## 2023-03-08 DIAGNOSIS — M6281 Muscle weakness (generalized): Secondary | ICD-10-CM | POA: Diagnosis not present

## 2023-03-08 DIAGNOSIS — M25561 Pain in right knee: Secondary | ICD-10-CM | POA: Diagnosis not present
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
## 2023-03-14 ENCOUNTER — Ambulatory Visit: Payer: Medicaid Other | Attending: Orthopedic Surgery | Admitting: Physical Therapy

## 2023-03-14 DIAGNOSIS — G8929 Other chronic pain: Secondary | ICD-10-CM | POA: Insufficient documentation

## 2023-03-14 DIAGNOSIS — M6281 Muscle weakness (generalized): Secondary | ICD-10-CM | POA: Insufficient documentation

## 2023-03-14 DIAGNOSIS — M25561 Pain in right knee: Secondary | ICD-10-CM | POA: Insufficient documentation
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
## 2023-03-15 ENCOUNTER — Encounter (INDEPENDENT_AMBULATORY_CARE_PROVIDER_SITE_OTHER): Payer: Self-pay | Admitting: Gastroenterology

## 2023-03-16 DIAGNOSIS — M25561 Pain in right knee: Secondary | ICD-10-CM | POA: Diagnosis not present

## 2023-03-17 ENCOUNTER — Ambulatory Visit: Payer: Medicaid Other

## 2023-03-17 DIAGNOSIS — G8929 Other chronic pain: Secondary | ICD-10-CM | POA: Diagnosis not present

## 2023-03-17 DIAGNOSIS — M25561 Pain in right knee: Secondary | ICD-10-CM | POA: Diagnosis not present

## 2023-03-17 DIAGNOSIS — M6281 Muscle weakness (generalized): Secondary | ICD-10-CM | POA: Diagnosis not present
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
## 2023-03-20 ENCOUNTER — Telehealth: Payer: Medicaid Other | Admitting: Physician Assistant

## 2023-03-20 DIAGNOSIS — J069 Acute upper respiratory infection, unspecified: Secondary | ICD-10-CM | POA: Diagnosis not present

## 2023-03-20 MED ORDER — LIDOCAINE VISCOUS HCL 2 % MT SOLN: OROMUCOSAL | 0 refills | Status: DC

## 2023-03-20 MED ORDER — PSEUDOEPH-BROMPHEN-DM 30-2-10 MG/5ML PO SYRP: 5.0000 mL | ORAL_SOLUTION | Freq: Four times a day (QID) | ORAL | 0 refills | Status: DC | PRN

## 2023-03-20 MED ORDER — FLUTICASONE PROPIONATE 50 MCG/ACT NA SUSP: 2.0000 | Freq: Every day | NASAL | 0 refills | Status: DC

## 2023-03-20 NOTE — Progress Notes (Signed)
E-Visit for Upper Respiratory Infection   We are sorry you are not feeling well.  Here is how we plan to help!  Based on what you have shared with me, it looks like you may have a viral upper respiratory infection.  Upper respiratory infections are caused by a large number of viruses; however, rhinovirus is the most common cause.   Symptoms vary from person to person, with common symptoms including sore throat, cough, fatigue or lack of energy and feeling of general discomfort.  A low-grade fever of up to 100.4 may present, but is often uncommon.  Symptoms vary however, and are closely related to a person's age or underlying illnesses.  The most common symptoms associated with an upper respiratory infection are nasal discharge or congestion, cough, sneezing, headache and pressure in the ears and face.  These symptoms usually persist for about 3 to 10 days, but can last up to 2 weeks.  It is important to know that upper respiratory infections do not cause serious illness or complications in most cases.    Upper respiratory infections can be transmitted from person to person, with the most common method of transmission being a person's hands.  The virus is able to live on the skin and can infect other persons for up to 2 hours after direct contact.  Also, these can be transmitted when someone coughs or sneezes; thus, it is important to cover the mouth to reduce this risk.  To keep the spread of the illness at bay, good hand hygiene is very important.  This is an infection that is most likely caused by a virus. There are no specific treatments other than to help you with the symptoms until the infection runs its course.  We are sorry you are not feeling well.  Here is how we plan to help!   For nasal congestion, you may use an oral decongestants such as Mucinex D or if you have glaucoma or high blood pressure use plain Mucinex.  Saline nasal spray or nasal drops can help and can safely be used as often as  needed for congestion.  For your congestion, I have prescribed Fluticasone nasal spray one spray in each nostril twice a day  If you do not have a history of heart disease, hypertension, diabetes or thyroid disease, prostate/bladder issues or glaucoma, you may also use Sudafed to treat nasal congestion.  It is highly recommended that you consult with a pharmacist or your primary care physician to ensure this medication is safe for you to take.     For cough I have prescribed for you Bromfed DM Take 5mL every 6 hours as needed for cough and congestion.  If you have a sore or scratchy throat, use a saltwater gargle-  to  teaspoon of salt dissolved in a 4-ounce to 8-ounce glass of warm water.  Gargle the solution for approximately 15-30 seconds and then spit.  It is important not to swallow the solution.  You can also use throat lozenges/cough drops and Chloraseptic spray to help with throat pain or discomfort.  Warm or cold liquids can also be helpful in relieving throat pain. I have prescribed Viscous Lidocaine 2% Swallow 5-9mL every 6 hours as needed for sore throat.  For headache, pain or general discomfort, you can use Ibuprofen or Tylenol as directed.   Some authorities believe that zinc sprays or the use of Echinacea may shorten the course of your symptoms.   HOME CARE Only take medications as instructed  by your medical team. Be sure to drink plenty of fluids. Water is fine as well as fruit juices, sodas and electrolyte beverages. You may want to stay away from caffeine or alcohol. If you are nauseated, try taking small sips of liquids. How do you know if you are getting enough fluid? Your urine should be a pale yellow or almost colorless. Get rest. Taking a steamy shower or using a humidifier may help nasal congestion and ease sore throat pain. You can place a towel over your head and breathe in the steam from hot water coming from a faucet. Using a saline nasal spray works much the same  way. Cough drops, hard candies and sore throat lozenges may ease your cough. Avoid close contacts especially the very young and the elderly Cover your mouth if you cough or sneeze Always remember to wash your hands.   GET HELP RIGHT AWAY IF: You develop worsening fever. If your symptoms do not improve within 10 days You develop yellow or green discharge from your nose over 3 days. You have coughing fits You develop a severe head ache or visual changes. You develop shortness of breath, difficulty breathing or start having chest pain Your symptoms persist after you have completed your treatment plan  MAKE SURE YOU  Understand these instructions. Will watch your condition. Will get help right away if you are not doing well or get worse.  Thank you for choosing an e-visit.  Your e-visit answers were reviewed by a board certified advanced clinical practitioner to complete your personal care plan. Depending upon the condition, your plan could have included both over the counter or prescription medications.  Please review your pharmacy choice. Make sure the pharmacy is open so you can pick up prescription now. If there is a problem, you may contact your provider through [employer] and have the prescription routed to another pharmacy.  Your safety is important to [place]. If you have drug allergies check your prescription carefully.   For the next 24 hours you can use MyChart to ask questions about today's visit, request a non-urgent call back, or ask for a work or school excuse. You will get an email in the next two days asking about your experience. I hope that your e-visit has been valuable and will speed your recovery.    I have spent 5 minutes in review of e-visit questionnaire, review and updating patient chart, medical decision making and response to patient.   Margaretann Loveless, PA-C

## 2023-03-21 ENCOUNTER — Encounter: Payer: Self-pay | Admitting: *Deleted

## 2023-03-21 ENCOUNTER — Ambulatory Visit: Payer: Medicaid Other | Admitting: *Deleted

## 2023-03-21 DIAGNOSIS — M6281 Muscle weakness (generalized): Secondary | ICD-10-CM | POA: Diagnosis not present

## 2023-03-21 DIAGNOSIS — G8929 Other chronic pain: Secondary | ICD-10-CM

## 2023-03-21 DIAGNOSIS — M25561 Pain in right knee: Secondary | ICD-10-CM | POA: Diagnosis not present
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
## 2023-03-23 ENCOUNTER — Ambulatory Visit: Payer: Medicaid Other

## 2023-03-23 DIAGNOSIS — M25561 Pain in right knee: Secondary | ICD-10-CM | POA: Diagnosis not present

## 2023-03-23 DIAGNOSIS — M6281 Muscle weakness (generalized): Secondary | ICD-10-CM | POA: Diagnosis not present

## 2023-03-23 DIAGNOSIS — G8929 Other chronic pain: Secondary | ICD-10-CM | POA: Diagnosis not present
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
## 2023-03-28 ENCOUNTER — Ambulatory Visit: Payer: Medicaid Other

## 2023-03-28 DIAGNOSIS — G8929 Other chronic pain: Secondary | ICD-10-CM

## 2023-03-28 DIAGNOSIS — M6281 Muscle weakness (generalized): Secondary | ICD-10-CM

## 2023-03-28 DIAGNOSIS — M25561 Pain in right knee: Secondary | ICD-10-CM | POA: Diagnosis not present
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
## 2023-03-29 ENCOUNTER — Ambulatory Visit: Payer: Medicaid Other | Admitting: Neurology

## 2023-03-29 DIAGNOSIS — G43101 Migraine with aura, not intractable, with status migrainosus: Secondary | ICD-10-CM

## 2023-03-29 DIAGNOSIS — G4710 Hypersomnia, unspecified: Secondary | ICD-10-CM

## 2023-03-29 DIAGNOSIS — F514 Sleep terrors [night terrors]: Secondary | ICD-10-CM

## 2023-03-29 DIAGNOSIS — R519 Headache, unspecified: Secondary | ICD-10-CM

## 2023-03-29 DIAGNOSIS — G4719 Other hypersomnia: Secondary | ICD-10-CM

## 2023-03-29 DIAGNOSIS — G43711 Chronic migraine without aura, intractable, with status migrainosus: Secondary | ICD-10-CM
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
## 2023-03-30 ENCOUNTER — Ambulatory Visit: Payer: Medicaid Other | Admitting: Physical Therapy

## 2023-03-30 DIAGNOSIS — M6281 Muscle weakness (generalized): Secondary | ICD-10-CM | POA: Diagnosis not present

## 2023-03-30 DIAGNOSIS — G8929 Other chronic pain: Secondary | ICD-10-CM

## 2023-03-30 DIAGNOSIS — M25561 Pain in right knee: Secondary | ICD-10-CM | POA: Diagnosis not present

## 2023-03-30 NOTE — Progress Notes (Signed)
[facility] 37 year old female 1986-01-22   HOME SLEEP TEST REPORT ( by Watch PAT)   STUDY DATE:  Mail out test 0 data load on 03-30-2023    Melvyn Novas, MD  REFERRING CLINICIAN: Dr Naomie Dean, MD ,  PCP   CLINICAL INFORMATION/HISTORY: [name] is a 37 y.o. female patient who is seen upon Dr Trevor Mace referral on 02/22/2023  for a sleep evaluation.    Chief concern according to patient :  Trouble to fall asleep, stay asleep, vivid dreams, some terrifying hallucinations, also having frequent migraines with aura and with status migrainosus.    [name] :   02/22/23 a right -handed female patient ,followed for migraine and morning headaches by Dr Lucia Gaskins, with a possible organic sleep disorder and long psychiatric history.  There have been various sleep disturbances reported including insomnia, nocturnal headaches and morning headaches.  The migraine frequency has recently increased over the last 8 months and at one time she was in status migrainosus reportedly for 14 days and barely functioning.  Anxiety and mood are followed by MD and she reports infrequent dreams but these can sometimes be nightmares.  At one time she was a sleep walker at age 56.   She does still have frequent nightmares, she sometimes has night terrors, the onset was around age 34 and her night terrors started with an initial screen follow-up with palpitations and becoming tachycardic, breaking out in sweat.  She reports that she has not left the bed when she had these terrifying episodes at night.   The patient has been on Emgality and Nurtec and sometimes takes over-the-counter medication such as Excedrin headache.   Sleep relevant medical history: Obesity, Bipolar , borderline personality, orthostatic  lightheadedness, Nocturia; 1-2, history of childhood Sleep walking/ night terrors as adult , Night terrors with initial scream,  no Tonsillectomy or other ENT surgery,  sinusitis, URI- asthma, allergic.   Family medical /sleep history: MGF and mother  on CPAP with OSA, Both and a sister have insomnia, no sleep walkers.     Sleep related headaches , not typical cluster but  migraines with aura, nausea , photophobia, She has experienced some headaches that wake her out of sleep- these were characterized as a sensation of a "tight band around the head "and "pain behind the right eye".  Had been in status migrainosus, has almost daily morning headaches - needing to rule out sleep apnea, sleep hypoxia.    Her associated apnea and hypopnea risk factors for OSA : the elevated BMI ( 40), Neck size, abdominal girth, impaired core strength.         Epworth sleepiness score: 16/ 24 points   FSS endorsed at 57/ 63 points.    BMI: 40 kg/m   Neck Circumference: 16"   FINDINGS:   Sleep Summary:   Total Recording Time (hours, min):   10 hours 6 minutes      Total Sleep Time (hours, min):    9 hours 7 minutes             Percent REM (%):   30.4%                                     Respiratory Indices:   Calculated pAHI (per hour):     7.0/h  REM pAHI:    13.7/h                                             NREM pAHI: 4.1/h                            Supine AHI:   The patient slept exclusively in supine with the associated AHI of 7/h.   Snoring reached a volume of 41 dB and was present for 23% of total recorded sleep time.                                               Oxygen Saturation Statistics:   Oxygen Saturation (%) Mean:      94%               O2 Saturation Range (%):        Between the nadir at 89% with a maximal saturation of 98%.                               O2 Saturation (minutes) <89%:   0 minutes        Pulse Rate Statistics:   Pulse Mean (bpm): 77 bpm               Pulse Range:    Between 64 and 120 bpm             IMPRESSION:  This HST confirms the presence of very mild and all obstructive sleep apnea.  This  apnea is not associated with hypoxia or bradycardia, and sleep was recorded in supine position only.  The main finding is the strong REM sleep accentuation, or REM sleep dependence.  I cannot assure the patient that CPAP therapy will help her headaches yet I think it is worth trying.  In addition weight loss is always recommended then REM sleep dependent apnea is seen and avoiding supine sleep may also help to reduce apnea further..       RECOMMENDATION: Autotitration CPAP device with a range between 5 and 15 cm water pressure, 2 cm EPR, heated humidification and interface to be fitted.    INTERPRETING PHYSICIAN:   Melvyn Novas, MD  [facility] Board certified by The [employer] of Sleep Medicine and Diplomate of the [employer] of Sleep Medicine. Board certified In Neurology through the ABPN, Fellow of the [employer] of Neu[employer].
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
## 2023-04-02 ENCOUNTER — Encounter: Payer: Self-pay | Admitting: Neurology

## 2023-04-02 NOTE — Addendum Note (Signed)
Addended by: Melvyn Novas on: 04/02/2023 08:01 PM   Modules accepted: Orders

## 2023-04-02 NOTE — Procedures (Signed)
[facility] 37 year old female [birth year]   HOME SLEEP TEST REPORT ( by Watch PAT)   STUDY DATE:  Mail out test 0 data load on 03-30-2023    Colleen Novas, [name]  REFERRING CLINICIAN: Dr Naomie Dean, [name] ,  PCP   CLINICAL INFORMATION/HISTORY: [name] is a 37 y.o. female patient who is seen upon Dr Trevor Mace referral on 02/22/2023  for a sleep evaluation.    Chief concern according to patient :  Trouble to fall asleep, stay asleep, vivid dreams, some terrifying hallucinations, also having frequent migraines with aura and with status migrainosus.    [name] :   02/22/23 a right -handed female patient ,followed for migraine and morning headaches by Dr Lucia Gaskins, with a possible organic sleep disorder and long psychiatric history.  There have been various sleep disturbances reported including insomnia, nocturnal headaches and morning headaches.  The migraine frequency has recently increased over the last 8 months and at one time she was in status migrainosus reportedly for 14 days and barely functioning.  Anxiety and mood are followed by [name] and she reports infrequent dreams but these can sometimes be nightmares.  At one time she was a sleep walker at age 89.   She does still have frequent nightmares, she sometimes has night terrors, the onset was around age 81 and her night terrors started with an initial screen follow-up with palpitations and becoming tachycardic, breaking out in sweat.  She reports that she has not left the bed when she had these terrifying episodes at night.   The patient has been on Emgality and Nurtec and sometimes takes over-the-counter medication such as Excedrin headache.   Sleep relevant medical history: Obesity, Bipolar , borderline personality, orthostatic  lightheadedness, Nocturia; 1-2, history of childhood Sleep walking/ night terrors as adult , Night terrors with initial scream,  no Tonsillectomy or other ENT surgery,  sinusitis, URI- asthma, allergic.   Family medical /sleep history: MGF and mother  on CPAP with OSA, Both and a sister have insomnia, no sleep walkers.     Sleep related headaches , not typical cluster but  migraines with aura, nausea , photophobia, She has experienced some headaches that wake her out of sleep- these were characterized as a sensation of a "tight band around the head "and "pain behind the right eye".  Had been in status migrainosus, has almost daily morning headaches - needing to rule out sleep apnea, sleep hypoxia.    Her associated apnea and hypopnea risk factors for OSA : the elevated BMI ( 40), Neck size, abdominal girth, impaired core strength.         Epworth sleepiness score: 16/ 24 points   FSS endorsed at 57/ 63 points.    BMI: 40 kg/m   Neck Circumference: 16"   FINDINGS:   Sleep Summary:   Total Recording Time (hours, min):   10 hours 6 minutes      Total Sleep Time (hours, min):    9 hours 7 minutes             Percent REM (%):   30.4%                                     Respiratory Indices:   Calculated pAHI (per hour):     7.0/h  REM pAHI:    13.7/h                                             NREM pAHI: 4.1/h                            Supine AHI:   The patient slept exclusively in supine with the associated AHI of 7/h.   Snoring reached a volume of 41 dB and was present for 23% of total recorded sleep time.                                               Oxygen Saturation Statistics:   Oxygen Saturation (%) Mean:      94%               O2 Saturation Range (%):        Between the nadir at 89% with a maximal saturation of 98%.                               O2 Saturation (minutes) <89%:   0 minutes        Pulse Rate Statistics:   Pulse Mean (bpm): 77 bpm               Pulse Range:    Between 64 and 120 bpm             IMPRESSION:  This HST confirms the presence of very mild and all obstructive sleep apnea.  This  apnea is not associated with hypoxia or bradycardia, and sleep was recorded in supine position only.  The main finding is the strong REM sleep accentuation, or REM sleep dependence.  I cannot assure the patient that CPAP therapy will help her headaches yet I think it is worth trying.  In addition weight loss is always recommended then REM sleep dependent apnea is seen and avoiding supine sleep may also help to reduce apnea further..       RECOMMENDATION: Autotitration CPAP device with a range between 5 and 15 cm water pressure, 2 cm EPR, heated humidification and interface to be fitted.    INTERPRETING PHYSICIAN:   Colleen Novas, [name]  [facility] Sleep Board certified by The [employer] of Sleep Medicine and Diplomate of the [employer] of [employer] Medicine. Board certified In Neurology through the ABPN, Fellow of the [employer] of Neu[employer].

## 2023-04-04 ENCOUNTER — Telehealth (HOSPITAL_COMMUNITY): Payer: Medicaid Other | Admitting: Psychiatry

## 2023-04-04 ENCOUNTER — Encounter (HOSPITAL_COMMUNITY): Payer: Self-pay | Admitting: Psychiatry

## 2023-04-04 ENCOUNTER — Telehealth: Payer: Medicaid Other | Admitting: Physician Assistant

## 2023-04-04 DIAGNOSIS — H6503 Acute serous otitis media, bilateral: Secondary | ICD-10-CM | POA: Diagnosis not present

## 2023-04-04 DIAGNOSIS — F411 Generalized anxiety disorder: Secondary | ICD-10-CM

## 2023-04-04 DIAGNOSIS — F4310 Post-traumatic stress disorder, unspecified: Secondary | ICD-10-CM | POA: Diagnosis not present

## 2023-04-04 DIAGNOSIS — F603 Borderline personality disorder: Secondary | ICD-10-CM | POA: Diagnosis not present

## 2023-04-04 DIAGNOSIS — M25561 Pain in right knee: Secondary | ICD-10-CM | POA: Diagnosis not present

## 2023-04-04 MED ORDER — LAMOTRIGINE 100 MG PO TABS: 200.0000 mg | ORAL_TABLET | Freq: Every day | ORAL | 0 refills | Status: DC

## 2023-04-04 MED ORDER — VORTIOXETINE HBR 10 MG PO TABS: 10.0000 mg | ORAL_TABLET | Freq: Every day | ORAL | 0 refills | Status: DC

## 2023-04-04 MED ORDER — GABAPENTIN 100 MG PO CAPS: 200.0000 mg | ORAL_CAPSULE | Freq: Three times a day (TID) | ORAL | 2 refills | Status: DC | PRN

## 2023-04-04 MED ORDER — FLUTICASONE PROPIONATE 50 MCG/ACT NA SUSP: 2.0000 | Freq: Every day | NASAL | 0 refills | Status: DC

## 2023-04-04 MED ORDER — TRAZODONE HCL 100 MG PO TABS: 100.0000 mg | ORAL_TABLET | Freq: Every day | ORAL | 0 refills | Status: DC

## 2023-04-04 MED ORDER — AMOXICILLIN-POT CLAVULANATE 875-125 MG PO TABS: 1.0000 | ORAL_TABLET | Freq: Two times a day (BID) | ORAL | 0 refills | Status: DC

## 2023-04-04 NOTE — Progress Notes (Signed)

## 2023-04-06 ENCOUNTER — Encounter: Payer: Self-pay | Admitting: Physical Therapy

## 2023-04-06 ENCOUNTER — Encounter: Payer: Medicaid Other | Admitting: *Deleted

## 2023-04-08 ENCOUNTER — Other Ambulatory Visit (HOSPITAL_COMMUNITY): Payer: Self-pay | Admitting: Psychiatry

## 2023-04-08 DIAGNOSIS — F411 Generalized anxiety disorder: Secondary | ICD-10-CM

## 2023-04-08 DIAGNOSIS — F603 Borderline personality disorder: Secondary | ICD-10-CM

## 2023-04-08 DIAGNOSIS — F4310 Post-traumatic stress disorder, unspecified: Secondary | ICD-10-CM

## 2023-04-11 ENCOUNTER — Ambulatory Visit (INDEPENDENT_AMBULATORY_CARE_PROVIDER_SITE_OTHER): Payer: Medicaid Other | Admitting: Gastroenterology

## 2023-04-14 DIAGNOSIS — G4730 Sleep apnea, unspecified: Secondary | ICD-10-CM | POA: Insufficient documentation

## 2023-04-14 DIAGNOSIS — G4733 Obstructive sleep apnea (adult) (pediatric): Secondary | ICD-10-CM | POA: Diagnosis not present

## 2023-04-23 ENCOUNTER — Encounter: Payer: Self-pay | Admitting: Neurology

## 2023-04-25 ENCOUNTER — Other Ambulatory Visit: Payer: Self-pay | Admitting: Neurology

## 2023-04-25 ENCOUNTER — Other Ambulatory Visit: Payer: Self-pay

## 2023-04-25 ENCOUNTER — Encounter (HOSPITAL_BASED_OUTPATIENT_CLINIC_OR_DEPARTMENT_OTHER): Payer: Self-pay | Admitting: Orthopaedic Surgery

## 2023-04-25 MED ORDER — WEGOVY 1 MG/0.5ML ~~LOC~~ SOAJ: 1.0000 mg | SUBCUTANEOUS | 2 refills | Status: DC

## 2023-04-25 NOTE — Progress Notes (Signed)
   04/25/23 1232  PAT Phone Screen  Is the patient taking a GLP-1 receptor agonist? (S)  Yes  Has the patient been informed on holding medication? (S)  Yes (1/13 last dose)  Do You Have Diabetes? No  Do You Have Hypertension? No (PIH)  Have You Ever Been to the ER for Asthma? No  Have You Taken Oral Steroids in the Past 3 Months? No  Do you Take Phenteramine or any Other Diet Drugs? No  Recent  Lab Work, EKG, CXR? No  Do you have a history of heart problems? No  Any Recent Hospitalizations? No  Height 5' 6 (1.676 m)  Weight 106.1 kg  Pat Appointment Scheduled No  Reason for No Appointment Not Needed

## 2023-04-27 ENCOUNTER — Telehealth: Payer: Self-pay

## 2023-04-27 ENCOUNTER — Other Ambulatory Visit: Payer: Self-pay | Admitting: Neurology

## 2023-04-27 ENCOUNTER — Other Ambulatory Visit (HOSPITAL_COMMUNITY): Payer: Self-pay

## 2023-04-27 DIAGNOSIS — E785 Hyperlipidemia, unspecified: Secondary | ICD-10-CM

## 2023-04-27 DIAGNOSIS — G43711 Chronic migraine without aura, intractable, with status migrainosus: Secondary | ICD-10-CM

## 2023-04-27 DIAGNOSIS — R519 Headache, unspecified: Secondary | ICD-10-CM

## 2023-04-27 DIAGNOSIS — G4719 Other hypersomnia: Secondary | ICD-10-CM

## 2023-04-27 DIAGNOSIS — I10 Essential (primary) hypertension: Secondary | ICD-10-CM

## 2023-04-27 MED ORDER — CANDESARTAN CILEXETIL 8 MG PO TABS: 8.0000 mg | ORAL_TABLET | Freq: Every day | ORAL | 6 refills | Status: DC

## 2023-04-27 NOTE — Telephone Encounter (Signed)
Pharmacy Patient Advocate Encounter   Received notification from CoverMyMeds that prior authorization for Emgality 120MG /ML auto-injectors (migraine)  is required/requested.   Insurance verification completed.   The patient is insured through [facility] .   Per test claim: Refill too soon. PA is not needed at this time. Medication was filled 04/13/2023. Next eligible fill date is 05/05/2023.

## 2023-04-27 NOTE — Telephone Encounter (Signed)
Pharmacy Patient Advocate Encounter  Received notification from [facility] that Prior Authorization for Nurtec 75MG  dispersible tablets  has been CANCELLED due to medication being discontinued   PA #/Case ID/Reference #: [id number]

## 2023-05-01 ENCOUNTER — Ambulatory Visit (INDEPENDENT_AMBULATORY_CARE_PROVIDER_SITE_OTHER): Payer: Medicaid Other | Admitting: Gastroenterology

## 2023-05-03 NOTE — Discharge Instructions (Signed)
Ramond Marrow MD, MPH Alfonse Alpers, PA-C [facility] [address], [address] [phone] (tel)   [phone] (fax)   POST-OPERATIVE INSTRUCTIONS - MPFL RECONSTRUCTION  WOUND CARE You may remove the Operative Dressing on Post-Op Day #3 (72hrs after surgery).   Leave steri strips in place.   If you feel more comfortable with it you can leave all dressings in place till your 1 week follow-up with me.   KEEP THE INCISIONS CLEAN AND DRY. An ACE wrap may be used to control swelling, do not wrap this too tight.  If the initial ACE wrap feels too tight or constricting you may loosen it. There may be a small amount of fluid/bleeding leaking at the surgical site.  This is normal; the knee is filled with fluid during the procedure and can leak for 24-48hrs after surgery. You may change/reinforce the bandage as needed.  Use the Cryocuff, GameReady or Ice as often as possible for the first 3-4 days, then as needed for pain relief. Always keep a towel, ACE wrap or other barrier between the cooling unit and your skin.  You may shower on Post-Op Day #3. Gently pat the area dry.  Do not soak the knee in water.  Do not go swimming in the pool or ocean until 4 weeks after surgery or when otherwise instructed.  BRACE/AMBULATION Your leg will be placed in a brace post-operatively.  You may remove for hygiene only! You will need to wear your brace at all times until we discuss it further.  It should be locked in full extension (0 degrees) if adjustable.   You will be instructed on further bracing after your first visit. Use crutches for comfort but you can put your full weight on the leg as tolerated.  PHYSICAL THERAPY - You will begin physical therapy soon after surgery (unless otherwise specified) - Please call to set up an appointment, if you do not already have one  - Let our office if there are any issues with scheduling your therapy   - A PT referral was sent to [facility]  Outpatient PT in [facility]  REGIONAL ANESTHESIA (NERVE BLOCKS) The anesthesia team may have performed a nerve block for you this is a great tool used to minimize pain.   The block may start wearing off overnight (between 8-24 hours postop) When the block wears off, your pain may go from nearly zero to the pain you would have had postop without the block. This is an abrupt transition but nothing dangerous is happening.   This can be a challenging period but utilize your as needed pain medications to try and manage this period. We suggest you use the pain medication the first night prior to going to bed, to ease this transition.  You may take an extra dose of narcotic when this happens if needed  POST-OP MEDICATIONS- Multimodal approach to pain control In general your pain will be controlled with a combination of substances.  Prescriptions unless otherwise discussed are electronically sent to your pharmacy.  This is a carefully made plan we use to minimize narcotic use.     Celebrex - Anti-inflammatory medication taken on a scheduled basis Acetaminophen - Non-narcotic pain medicine taken on a scheduled basis  Oxycodone - This is a strong narcotic, to be used only on an "as needed" basis for SEVERE pain. Aspirin 81mg  - This medicine is used to minimize the risk of blood clots after surgery.   Zofran - take as needed for  nausea   FOLLOW-UP Please call the office to schedule a follow-up appointment for your incision check if you do not already have one, 7-10 days post-operatively. IF YOU HAVE ANY QUESTIONS, PLEASE FEEL FREE TO CALL OUR OFFICE.  HELPFUL INFORMATION  Keep your leg elevated to decrease swelling, which will then in turn decrease your pain. I would elevate the foot of your bed by putting a couple of couch pillows between your mattress and box spring. I would not keep pillow directly under your ankle.  You must wear the brace locked while sleeping and ambulating until follow-up.    There will be MORE swelling on days 1-3 than there is on the day of surgery.  This also is normal. The swelling will decrease with the anti-inflammatory medication, ice and keeping it elevated. The swelling will make it more difficult to bend your knee. As the swelling goes down your motion will become easier  You may develop swelling and bruising that extends from your knee down to your calf and perhaps even to your foot over the next week. Do not be alarmed. This too is normal, and it is due to gravity  There may be some numbness adjacent to the incision site. This may last for 6-12 months or longer in some patients and is expected.  You may return to sedentary work/school in the next couple of days when you feel up to it. You will need to keep your leg elevated as much as possible   You should wean off your narcotic medicines as soon as you are able.  Most patients will be off narcotics before their first postop appointment.   We suggest you use the pain medication the first night prior to going to bed, in order to ease any pain when the anesthesia wears off. You should avoid taking pain medications on an empty stomach as it will make you nauseous.  Do not drink alcoholic beverages or take illicit drugs when taking pain medications.  It is against the law to drive while taking narcotics. You cannot drive if your Right leg is in brace locked in extension.  Pain medication may make you constipated.  Below are a few solutions to try in this order: Decrease the amount of pain medication if you aren't having pain. Drink lots of decaffeinated fluids. Drink prune juice and/or eat dried prunes  If the first 3 don't work start with additional solutions Take Colace - an over-the-counter stool softener Take Senokot - an over-the-counter laxative Take Miralax - a stronger over-the-counter laxative   For more information including helpful videos and documents visit our website:    [email or web address]     No Tylenol before 4:15pm today. No NSAIDS/Ibuprofen before 9pm tonight. You had 10mg  of Oxycodone at 2:30pm.   Post Anesthesia Home Care Instructions  Activity: Get plenty of rest for the remainder of the day. A responsible individual must stay with you for 24 hours following the procedure.  For the next 24 hours, DO NOT: -Drive a car -Advertising copywriter -Drink alcoholic beverages -Take any medication unless instructed by your physician -Make any legal decisions or sign important papers.  Meals: Start with liquid foods such as gelatin or soup. Progress to regular foods as tolerated. Avoid greasy, spicy, heavy foods. If nausea and/or vomiting occur, drink only clear liquids until the nausea and/or vomiting subsides. Call your physician if vomiting continues.  Special Instructions/Symptoms: Your throat may feel dry or sore from the anesthesia or the breathing tube placed in  your throat during surgery. If this causes discomfort, gargle with warm salt water. The discomfort should disappear within 24 hours.  If you had a scopolamine patch placed behind your ear for the management of post- operative nausea and/or vomiting:  1. The medication in the patch is effective for 72 hours, after which it should be removed.  Wrap patch in a tissue and discard in the trash. Wash hands thoroughly with soap and water. 2. You may remove the patch earlier than 72 hours if you experience unpleasant side effects which may include dry mouth, dizziness or visual disturbances. 3. Avoid touching the patch. Wash your hands with soap and water after contact with the patch.

## 2023-05-03 NOTE — H&P (Signed)
PREOPERATIVE H&P  Chief Complaint: RIGHT MEDIAL PATELLOFEMORAL LIGAMENT TEAR  HPI: [name] is a 38 y.o. female who is scheduled for, Procedure(s): MEDIAL PATELLA FEMORAL LIGAMENT RECONSTRUCTION WITH OSTEOCHONDRAL AUTOGRAFT.   Patient has a past medical history significant for HLD.   The patient is a 38 year old who has had two surgeries for patellofemoral instability including medial patellofemoral ligament reefing and lateral release. 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 She has tried multiple things including bracing and physical therapy for an extended period of time.  She is now unhappy with her knee  Symptoms are rated as moderate to severe, and have been worsening.  This is significantly impairing activities of daily living.    Please see clinic note for further details on this patient's care.    She has elected for surgical management.   Past Medical History:  Diagnosis Date   Borderline personality disorder (HCC)    Headache    Hyperlipidemia 09/25/2021   Hypertension in pregnancy    Knee pain    Nephrolithiasis    Postpartum depression    Preeclampsia    Sleep apnea    Past Surgical History:  Procedure Laterality Date   APPENDECTOMY     CESAREAN SECTION     CYSTOSCOPY/URETEROSCOPY/HOLMIUM LASER/STENT PLACEMENT Right 04/16/2021   Procedure: CYSTOSCOPY RIGHT RETROGRADE PYELOGRAM  URETEROSCOPY/HOLMIUM LASER/STENT PLACEMENT;  Surgeon: Bjorn Pippin, MD;  Location: [facility];  Service: Urology;  Laterality: Right;   HOLMIUM LASER APPLICATION Right 04/16/2021   Procedure: HOLMIUM LASER APPLICATION;  Surgeon: Bjorn Pippin, MD;  Location: [facility];  Service: Urology;  Laterality: Right;   KIDNEY STONE SURGERY  04/16/2021   r-kidney   KNEE SURGERY Right    scoped x2   WISDOM TOOTH EXTRACTION  2010   Social History   Socioeconomic History   Marital status: Significant Other    Spouse name: Not on file   Number of  children: Not on file   Years of education: Not on file   Highest education level: Some college, no degree  Occupational History   Not on file  Tobacco Use   Smoking status: Never   Smokeless tobacco: Never  Vaping Use   Vaping status: Never Used  Substance and Sexual Activity   Alcohol use: No   Drug use: Not Currently   Sexual activity: Yes    Birth control/protection: None  Other Topics Concern   Not on file  Social History Narrative   Caffiene 300mg  daily   Working : delivery service    Social Drivers of Health   Financial Resource Strain: High Risk (02/22/2023)   Overall Financial Resource Strain (CARDIA)    Difficulty of Paying Living Expenses: Hard  Food Insecurity: Food Insecurity Present (02/22/2023)   Hunger Vital Sign    Worried About Running Out of Food in the Last Year: Sometimes true    Ran Out of Food in the Last Year: Sometimes true  Transportation Needs: No Transportation Needs (02/22/2023)   PRAPARE - Administrator, Civil Service (Medical): No    Lack of Transportation (Non-Medical): No  Physical Activity: Sufficiently Active (02/22/2023)   Exercise Vital Sign    Days of Exercise per Week: 5 days    Minutes of Exercise per Session: 30 min  Stress: Stress Concern Present (02/22/2023)   [name] [employer] Occupational Health - Occupational Stress Questionnaire    Feeling of Stress : Rather much  Social Connections: Unknown (02/22/2023)  Social Advertising account executive [NHANES]    Frequency of Communication with Friends and Family: More than three times a week    Frequency of Social Gatherings with Friends and Family: Once a week    Attends Religious Services: Never    Diplomatic Services operational officer: Patient declined    Attends Engineer, structural: Not on file    Marital Status: Living with partner   Family History  Problem Relation Age of Onset   Migraines Mother    Hypertension Mother    COPD Mother     Healthy Father    Migraines Maternal Grandmother    Stroke Maternal Grandmother    Diabetes Maternal Grandfather    Hypertension Maternal Grandfather    Allergies  Allergen Reactions   Sodium Hypochlorite Shortness Of Breath   Keflex [Cephalexin]    Latex    Skin Bleaching [Hydroquinone]     Facial swelling, SOB   Prior to Admission medications   Medication Sig Start Date End Date Taking? Authorizing Provider  doxycycline (VIBRA-TABS) 100 MG tablet Take 100 mg by mouth 2 (two) times daily.   Yes [provider]  gabapentin (NEURONTIN) 100 MG capsule Take 2 capsules (200 mg total) by mouth 3 (three) times daily as needed (anxiety). 04/04/23  Yes Stasia Cavalier, MD  Galcanezumab-gnlm [facility]) 120 MG/ML SOAJ Inject 120 mg into the skin every 30 (thirty) days. 01/20/23  Yes Anson Fret, MD  lamoTRIgine (LAMICTAL) 100 MG tablet Take 2 tablets (200 mg total) by mouth daily. 04/04/23 07/03/23 Yes Stasia Cavalier, MD  rosuvastatin (CRESTOR) 40 MG tablet Take 1 tablet (40 mg total) by mouth daily. 01/20/23  Yes Anson Fret, MD  traZODone (DESYREL) 100 MG tablet Take 1 tablet (100 mg total) by mouth at bedtime. 04/04/23  Yes Stasia Cavalier, MD  Vitamin D, Ergocalciferol, (DRISDOL) 1.25 MG (50000 UNIT) CAPS capsule TAKE 1 CAPSULE (50,000 UNITS TOTAL) BY MOUTH EVERY 7 (SEVEN) DAYS 02/06/23  Yes Rakes, Doralee Albino, FNP  vortioxetine HBr (TRINTELLIX) 10 MG TABS tablet Take 1 tablet (10 mg total) by mouth daily. Start on 11/21 Patient taking differently: Take 10 mg by mouth daily. Start on 11/21 04/04/23  Yes Stasia Cavalier, MD  albuterol (VENTOLIN HFA) 108 (90 Base) MCG/ACT inhaler Inhale 1-2 puffs into the lungs every 6 (six) hours as needed for wheezing or shortness of breath. 07/25/22   Valentino Nose, NP  candesartan (ATACAND) 8 MG tablet Take 1 tablet (8 mg total) by mouth daily. 04/27/23   Anson Fret, MD  clindamycin (CLEOCIN T) 1 % SWAB Apply topically 2 (two) times  daily.    [provider]  nystatin cream (MYCOSTATIN) Apply 1 Application topically 2 (two) times daily. Patient taking differently: Apply 1 Application topically 2 (two) times daily as needed. 09/26/22   Waldon Merl, PA-C  polyethylene glycol powder (GLYCOLAX/MIRALAX) 17 GM/SCOOP powder Take 17 g by mouth daily. 02/24/23   Sonny Masters, FNP  Semaglutide-Weight Management (WEGOVY) 1 MG/0.5ML SOAJ Inject 1 mg into the skin once a week. 04/25/23   Anson Fret, MD    ROS: All other systems have been reviewed and were otherwise negative with the exception of those mentioned in the HPI and as above.  Physical Exam: General: Alert, no acute distress Cardiovascular: No pedal edema Respiratory: No cyanosis, no use of accessory musculature GI: No organomegaly, abdomen is soft and non-tender Skin: No lesions in the area of  chief complaint Neurologic: Sensation intact distally Psychiatric: Patient is competent for consent with normal mood and affect Lymphatic: No axillary or cervical lymphadenopathy  MUSCULOSKELETAL:  The range of motion of the knee is full.  She has an obvious J sign. She has significant apprehension with lateral translation patella.   Imaging: MRI is reviewed and demonstrates a deJour  Type B trochlea. Caton Deschamps ratio of 1.2 without significant patella alta. There is thinning of the medial retinacular structures consistent with recurrent  instability  Assessment: RIGHT MEDIAL PATELLOFEMORAL LIGAMENT TEAR  Plan: Plan for Procedure(s): MEDIAL PATELLA FEMORAL LIGAMENT RECONSTRUCTION WITH OSTEOCHONDRAL AUTOGRAFT  The risks benefits and alternatives were discussed with the patient including but not limited to the risks of nonoperative treatment, versus surgical intervention including infection, bleeding, nerve injury,  blood clots, cardiopulmonary complications, morbidity, mortality, among others, and they were willing to proceed.   The patient  acknowledged the explanation, agreed to proceed with the plan and consent was signed.   Operative Plan: Right knee scope with MPFL reconstruction and possible trochleoplasty  Discharge Medications: standard DVT Prophylaxis: aspirin Physical Therapy: outpatient PT Special Discharge needs: [place] (need to order). [employer]   Vernetta Honey, PA-C  05/03/2023 9:08 PM

## 2023-05-04 ENCOUNTER — Ambulatory Visit (HOSPITAL_BASED_OUTPATIENT_CLINIC_OR_DEPARTMENT_OTHER)
Admission: RE | Admit: 2023-05-04 | Discharge: 2023-05-04 | Disposition: A | Discharge status: Home / Self Care | Payer: Medicaid Other | Attending: Orthopaedic Surgery | Admitting: Orthopaedic Surgery

## 2023-05-04 ENCOUNTER — Ambulatory Visit (HOSPITAL_BASED_OUTPATIENT_CLINIC_OR_DEPARTMENT_OTHER): Payer: Medicaid Other | Admitting: Anesthesiology

## 2023-05-04 ENCOUNTER — Other Ambulatory Visit: Payer: Self-pay

## 2023-05-04 ENCOUNTER — Encounter (HOSPITAL_BASED_OUTPATIENT_CLINIC_OR_DEPARTMENT_OTHER): Payer: Self-pay | Admitting: Orthopaedic Surgery

## 2023-05-04 ENCOUNTER — Ambulatory Visit (HOSPITAL_BASED_OUTPATIENT_CLINIC_OR_DEPARTMENT_OTHER): Payer: Medicaid Other

## 2023-05-04 ENCOUNTER — Encounter (HOSPITAL_BASED_OUTPATIENT_CLINIC_OR_DEPARTMENT_OTHER)
Admission: RE | Disposition: A | Discharge status: Home / Self Care | Payer: Self-pay | Source: Home / Self Care | Attending: Orthopaedic Surgery

## 2023-05-04 DIAGNOSIS — M21951 Unspecified acquired deformity of right thigh: Secondary | ICD-10-CM | POA: Diagnosis not present

## 2023-05-04 DIAGNOSIS — M2351 Chronic instability of knee, right knee: Secondary | ICD-10-CM | POA: Diagnosis present

## 2023-05-04 DIAGNOSIS — F319 Bipolar disorder, unspecified: Secondary | ICD-10-CM | POA: Diagnosis not present

## 2023-05-04 DIAGNOSIS — Z5986 Financial insecurity: Secondary | ICD-10-CM | POA: Insufficient documentation

## 2023-05-04 DIAGNOSIS — G4730 Sleep apnea, unspecified: Secondary | ICD-10-CM | POA: Diagnosis not present

## 2023-05-04 DIAGNOSIS — E785 Hyperlipidemia, unspecified: Secondary | ICD-10-CM | POA: Diagnosis not present

## 2023-05-04 DIAGNOSIS — Z01818 Encounter for other preprocedural examination: Secondary | ICD-10-CM

## 2023-05-04 DIAGNOSIS — M25361 Other instability, right knee: Secondary | ICD-10-CM | POA: Diagnosis not present

## 2023-05-04 DIAGNOSIS — M93261 Osteochondritis dissecans, right knee: Secondary | ICD-10-CM | POA: Diagnosis not present

## 2023-05-04 DIAGNOSIS — S838X1A Sprain of other specified parts of right knee, initial encounter: Secondary | ICD-10-CM | POA: Diagnosis not present

## 2023-05-04 DIAGNOSIS — S83401A Sprain of unspecified collateral ligament of right knee, initial encounter: Secondary | ICD-10-CM | POA: Diagnosis not present

## 2023-05-04 HISTORY — PX: MEDIAL PATELLOFEMORAL LIGAMENT REPAIR: SHX2020

## 2023-05-04 HISTORY — DX: Sleep apnea, unspecified: G47.30

## 2023-05-04 HISTORY — DX: Headache, unspecified: R51.9

## 2023-05-04 LAB — POCT PREGNANCY, URINE
Preg Test, Ur: NEGATIVE
Preg Test, Ur: NEGATIVE

## 2023-05-04 SURGERY — RECONSTRUCTION, LIGAMENT, MEDIAL PATELLOFEMORAL
Anesthesia: General | Site: Knee | Laterality: Right

## 2023-05-04 MED ORDER — FENTANYL CITRATE (PF) 100 MCG/2ML IJ SOLN
INTRAMUSCULAR | Status: AC
  Filled 2023-05-04: qty 2

## 2023-05-04 MED ORDER — DEXAMETHASONE SODIUM PHOSPHATE 10 MG/ML IJ SOLN
INTRAMUSCULAR | Status: AC
  Filled 2023-05-04: qty 1

## 2023-05-04 MED ORDER — CEFAZOLIN SODIUM-DEXTROSE 2-4 GM/100ML-% IV SOLN
INTRAVENOUS | Status: AC
  Filled 2023-05-04: qty 100

## 2023-05-04 MED ORDER — SODIUM CHLORIDE 0.9 % IV SOLN: INTRAVENOUS | Status: DC | PRN

## 2023-05-04 MED ORDER — HYDROMORPHONE HCL 1 MG/ML IJ SOLN
INTRAMUSCULAR | Status: AC
  Filled 2023-05-04: qty 0.5

## 2023-05-04 MED ORDER — BUPIVACAINE HCL (PF) 0.25 % IJ SOLN
INTRAMUSCULAR | Status: DC | PRN
  Administered 2023-05-04: 20 mL

## 2023-05-04 MED ORDER — LIDOCAINE 2% (20 MG/ML) 5 ML SYRINGE
INTRAMUSCULAR | Status: DC | PRN
  Administered 2023-05-04: 60 mg via INTRAVENOUS

## 2023-05-04 MED ORDER — KETOROLAC TROMETHAMINE 30 MG/ML IJ SOLN
INTRAMUSCULAR | Status: DC | PRN
  Administered 2023-05-04: 30 mg via INTRAVENOUS

## 2023-05-04 MED ORDER — KETOROLAC TROMETHAMINE 30 MG/ML IJ SOLN: 30.0000 mg | Freq: Once | INTRAMUSCULAR | Status: AC | PRN

## 2023-05-04 MED ORDER — ROCURONIUM BROMIDE 10 MG/ML (PF) SYRINGE
PREFILLED_SYRINGE | INTRAVENOUS | Status: AC
  Filled 2023-05-04: qty 10

## 2023-05-04 MED ORDER — PROPOFOL 10 MG/ML IV BOLUS
INTRAVENOUS | Status: AC
  Filled 2023-05-04: qty 20

## 2023-05-04 MED ORDER — AMISULPRIDE (ANTIEMETIC) 5 MG/2ML IV SOLN: 10.0000 mg | Freq: Once | INTRAVENOUS | Status: DC | PRN

## 2023-05-04 MED ORDER — VANCOMYCIN HCL 1 G IV SOLR
INTRAVENOUS | Status: DC | PRN
  Administered 2023-05-04: 1000 mg via TOPICAL

## 2023-05-04 MED ORDER — FENTANYL CITRATE (PF) 100 MCG/2ML IJ SOLN
25.0000 ug | INTRAMUSCULAR | Status: DC | PRN
  Administered 2023-05-04: 25 ug via INTRAVENOUS
  Administered 2023-05-04 (×2): 50 ug via INTRAVENOUS
  Administered 2023-05-04: 25 ug via INTRAVENOUS

## 2023-05-04 MED ORDER — DEXAMETHASONE SODIUM PHOSPHATE 10 MG/ML IJ SOLN
INTRAMUSCULAR | Status: DC | PRN
  Administered 2023-05-04: 8 mg via INTRAVENOUS
  Administered 2023-05-04: 10 mg via INTRAVENOUS

## 2023-05-04 MED ORDER — ROCURONIUM BROMIDE 10 MG/ML (PF) SYRINGE
PREFILLED_SYRINGE | INTRAVENOUS | Status: DC | PRN
  Administered 2023-05-04: 20 mg via INTRAVENOUS

## 2023-05-04 MED ORDER — VANCOMYCIN HCL 1000 MG IV SOLR
INTRAVENOUS | Status: AC
  Filled 2023-05-04: qty 20

## 2023-05-04 MED ORDER — ONDANSETRON HCL 4 MG/2ML IJ SOLN
INTRAMUSCULAR | Status: DC | PRN
  Administered 2023-05-04: 4 mg via INTRAVENOUS

## 2023-05-04 MED ORDER — PROPOFOL 10 MG/ML IV BOLUS
INTRAVENOUS | Status: DC | PRN
  Administered 2023-05-04: 50 mg via INTRAVENOUS
  Administered 2023-05-04: 200 mg via INTRAVENOUS
  Administered 2023-05-04: 50 mg via INTRAVENOUS

## 2023-05-04 MED ORDER — ACETAMINOPHEN 500 MG PO TABS
1000.0000 mg | ORAL_TABLET | Freq: Once | ORAL | Status: AC
  Administered 2023-05-04: 1000 mg via ORAL

## 2023-05-04 MED ORDER — HYDROMORPHONE HCL 1 MG/ML IJ SOLN
INTRAMUSCULAR | Status: DC | PRN
  Administered 2023-05-04: .5 mg via INTRAVENOUS

## 2023-05-04 MED ORDER — BUPIVACAINE HCL (PF) 0.25 % IJ SOLN
INTRAMUSCULAR | Status: AC
  Filled 2023-05-04: qty 30

## 2023-05-04 MED ORDER — ACETAMINOPHEN 500 MG PO TABS: 1000.0000 mg | ORAL_TABLET | Freq: Three times a day (TID) | ORAL | 0 refills | Status: AC

## 2023-05-04 MED ORDER — FENTANYL CITRATE (PF) 100 MCG/2ML IJ SOLN
INTRAMUSCULAR | Status: DC | PRN
  Administered 2023-05-04 (×2): 50 ug via INTRAVENOUS

## 2023-05-04 MED ORDER — ONDANSETRON HCL 4 MG/2ML IJ SOLN
INTRAMUSCULAR | Status: AC
  Filled 2023-05-04: qty 2

## 2023-05-04 MED ORDER — GABAPENTIN 300 MG PO CAPS
ORAL_CAPSULE | ORAL | Status: AC
  Filled 2023-05-04: qty 1

## 2023-05-04 MED ORDER — KETOROLAC TROMETHAMINE 30 MG/ML IJ SOLN
INTRAMUSCULAR | Status: AC
  Filled 2023-05-04: qty 1

## 2023-05-04 MED ORDER — LACTATED RINGERS IV SOLN: INTRAVENOUS | Status: DC

## 2023-05-04 MED ORDER — ONDANSETRON HCL 4 MG PO TABS: 4.0000 mg | ORAL_TABLET | Freq: Three times a day (TID) | ORAL | 0 refills | Status: AC | PRN

## 2023-05-04 MED ORDER — OXYCODONE HCL 5 MG PO TABS
10.0000 mg | ORAL_TABLET | Freq: Once | ORAL | Status: AC | PRN
  Administered 2023-05-04: 10 mg via ORAL

## 2023-05-04 MED ORDER — OXYCODONE HCL 5 MG/5ML PO SOLN: 5.0000 mg | Freq: Once | ORAL | Status: AC | PRN

## 2023-05-04 MED ORDER — SODIUM CHLORIDE 0.9 % IR SOLN
Status: DC | PRN
  Administered 2023-05-04: 500 mL

## 2023-05-04 MED ORDER — GABAPENTIN 300 MG PO CAPS
300.0000 mg | ORAL_CAPSULE | Freq: Once | ORAL | Status: AC
  Administered 2023-05-04: 300 mg via ORAL

## 2023-05-04 MED ORDER — CELECOXIB 100 MG PO CAPS: 100.0000 mg | ORAL_CAPSULE | Freq: Two times a day (BID) | ORAL | 0 refills | Status: AC

## 2023-05-04 MED ORDER — OXYCODONE HCL 5 MG PO TABS: ORAL_TABLET | ORAL | 0 refills | Status: AC

## 2023-05-04 MED ORDER — DEXMEDETOMIDINE HCL IN NACL 80 MCG/20ML IV SOLN
INTRAVENOUS | Status: DC | PRN
  Administered 2023-05-04: 8 ug via INTRAVENOUS
  Administered 2023-05-04 (×2): 4 ug via INTRAVENOUS

## 2023-05-04 MED ORDER — ACETAMINOPHEN 500 MG PO TABS
ORAL_TABLET | ORAL | Status: AC
  Filled 2023-05-04: qty 2

## 2023-05-04 MED ORDER — OXYCODONE HCL 5 MG PO TABS
ORAL_TABLET | ORAL | Status: AC
  Filled 2023-05-04: qty 2

## 2023-05-04 MED ORDER — EPHEDRINE 5 MG/ML INJ
INTRAVENOUS | Status: AC
  Filled 2023-05-04: qty 5

## 2023-05-04 MED ORDER — HYDROMORPHONE HCL 1 MG/ML IJ SOLN
0.5000 mg | INTRAMUSCULAR | Status: AC | PRN
Start: 2023-05-04 — End: 2023-05-04
  Administered 2023-05-04 (×4): 0.5 mg via INTRAVENOUS

## 2023-05-04 MED ORDER — MIDAZOLAM HCL 2 MG/2ML IJ SOLN
INTRAMUSCULAR | Status: AC
  Filled 2023-05-04: qty 2

## 2023-05-04 MED ORDER — CEFAZOLIN SODIUM-DEXTROSE 2-4 GM/100ML-% IV SOLN
2.0000 g | INTRAVENOUS | Status: AC
  Administered 2023-05-04: 2 g via INTRAVENOUS

## 2023-05-04 MED ORDER — ASPIRIN 81 MG PO CHEW: 81.0000 mg | CHEWABLE_TABLET | Freq: Two times a day (BID) | ORAL | 0 refills | Status: AC

## 2023-05-04 MED ORDER — MIDAZOLAM HCL 5 MG/5ML IJ SOLN
INTRAMUSCULAR | Status: DC | PRN
  Administered 2023-05-04: 2 mg via INTRAVENOUS

## 2023-05-04 MED ORDER — EPHEDRINE SULFATE (PRESSORS) 50 MG/ML IJ SOLN
INTRAMUSCULAR | Status: DC | PRN
  Administered 2023-05-04: 20 mg via INTRAVENOUS

## 2023-05-04 MED ORDER — LIDOCAINE 2% (20 MG/ML) 5 ML SYRINGE
INTRAMUSCULAR | Status: AC
  Filled 2023-05-04: qty 5

## 2023-05-04 MED ORDER — OXYCODONE HCL 5 MG PO TABS: 5.0000 mg | ORAL_TABLET | Freq: Once | ORAL | Status: AC | PRN

## 2023-05-04 SURGICAL SUPPLY — 58 items
ANCH DBL 2.6 SLF-PNCH FIBERTAK (Anchor) ×3 IMPLANT
ANCHOR DBL 2.6 SLF-PNCH FIBRTK (Anchor) IMPLANT
ANCHOR PSHLK BCMP PEK 3.5X19.5 (Anchor) IMPLANT
ANCHOR PUSHLOCK PEEK 3.5X19.5 (Anchor) ×5 IMPLANT
BLADE SHAVER BONE 5.0X13 (MISCELLANEOUS) IMPLANT
BLADE SURG 10 STRL SS (BLADE) ×2 IMPLANT
BLADE SURG 15 STRL LF DISP TIS (BLADE) ×2 IMPLANT
BNDG ELASTIC 6INX 5YD STR LF (GAUZE/BANDAGES/DRESSINGS) ×2 IMPLANT
BURR OVAL 8 FLU 4.0X13 (MISCELLANEOUS) IMPLANT
CHLORAPREP W/TINT 26 (MISCELLANEOUS) ×2 IMPLANT
CLSR STERI-STRIP ANTIMIC 1/2X4 (GAUZE/BANDAGES/DRESSINGS) ×2 IMPLANT
COOLER ICEMAN CLASSIC (MISCELLANEOUS) ×2 IMPLANT
CUFF TRNQT CYL 34X4.125X (TOURNIQUET CUFF) ×2 IMPLANT
DISSECTOR 3.5MM X 13CM CVD (MISCELLANEOUS) IMPLANT
DISSECTOR 4.0MMX13CM CVD (MISCELLANEOUS) ×2 IMPLANT
DRAPE C-ARM 42X72 X-RAY (DRAPES) IMPLANT
DRAPE C-ARMOR (DRAPES) IMPLANT
DRAPE OEC MINIVIEW 54X84 (DRAPES) IMPLANT
DRAPE U-SHAPE 47X51 STRL (DRAPES) ×2 IMPLANT
DRAPE-T ARTHROSCOPY W/POUCH (DRAPES) ×2 IMPLANT
ELECT REM PT RETURN 9FT ADLT (ELECTROSURGICAL) ×1
ELECTRODE REM PT RTRN 9FT ADLT (ELECTROSURGICAL) ×2 IMPLANT
GAUZE SPONGE 4X4 12PLY STRL (GAUZE/BANDAGES/DRESSINGS) ×4 IMPLANT
GLOVE BIO SURGEON STRL SZ 6.5 (GLOVE) ×2 IMPLANT
GLOVE BIOGEL PI IND STRL 6.5 (GLOVE) ×2 IMPLANT
GLOVE BIOGEL PI IND STRL 8 (GLOVE) ×2 IMPLANT
GLOVE ECLIPSE 8.0 STRL XLNG CF (GLOVE) ×2 IMPLANT
GLOVE SURG SS PI 6.5 STRL IVOR (GLOVE) IMPLANT
GLOVE SURG SYN 7.5 E (GLOVE) ×1
GLOVE SURG SYN 7.5 PF PI (GLOVE) IMPLANT
GLOVE SURG SYN 8.0 (GLOVE) ×1
GLOVE SURG SYN 8.0 PF PI (GLOVE) IMPLANT
GOWN STRL REUS W/ TWL LRG LVL3 (GOWN DISPOSABLE) ×4 IMPLANT
GOWN STRL REUS W/TWL XL LVL3 (GOWN DISPOSABLE) ×2 IMPLANT
GRAFT TISS SEMITEND 4-8 (Bone Implant) IMPLANT
KIT ANCHOR SHOULDER 3.5MX7ML (MISCELLANEOUS) IMPLANT
KIT BURR TROCHL W/2.9 BURR (KITS) IMPLANT
KIT KNEE FIBERTAK DISP (KITS) IMPLANT
MANIFOLD NEPTUNE II (INSTRUMENTS) ×2 IMPLANT
NDL SUT 6 .5 CRC .975X.05 MAYO (NEEDLE) ×2 IMPLANT
PACK ARTHROSCOPY DSU (CUSTOM PROCEDURE TRAY) ×2 IMPLANT
PACK BASIN DAY SURGERY FS (CUSTOM PROCEDURE TRAY) ×2 IMPLANT
PAD COLD SHLDR WRAP-ON (PAD) ×2 IMPLANT
PENCIL SMOKE EVACUATOR (MISCELLANEOUS) ×2 IMPLANT
PUTTY DBM STAGRAFT 5CC (Putty) IMPLANT
SPONGE T-LAP 18X18 ~~LOC~~+RFID (SPONGE) IMPLANT
SPONGE T-LAP 4X18 ~~LOC~~+RFID (SPONGE) ×2 IMPLANT
SUT FIBERWIRE #2 38 T-5 BLUE (SUTURE)
SUT MNCRL AB 4-0 PS2 18 (SUTURE) ×2 IMPLANT
SUT VIC AB 0 CT1 27XBRD ANBCTR (SUTURE) ×2 IMPLANT
SUT VIC AB 1 CT1 27XBRD ANBCTR (SUTURE) IMPLANT
SUT VIC AB 3-0 SH 27X BRD (SUTURE) ×2 IMPLANT
SUTURE FIBERWR #2 38 T-5 BLUE (SUTURE) IMPLANT
TENDON SEMI-TENDINOSUS (Bone Implant) ×1 IMPLANT
TOWEL GREEN STERILE FF (TOWEL DISPOSABLE) ×4 IMPLANT
TUBE SUCTION HIGH CAP CLEAR NV (SUCTIONS) ×2 IMPLANT
TUBING ARTHROSCOPY IRRIG 16FT (MISCELLANEOUS) ×2 IMPLANT
WAND ABLATOR APOLLO I90 (BUR) IMPLANT

## 2023-05-04 NOTE — Transfer of Care (Signed)
Immediate Anesthesia Transfer of Care Note  Patient: [name]  Procedure(s) Performed: MEDIAL PATELLA FEMORAL LIGAMENT RECONSTRUCTION WITH OSTEOCHONDRAL AUTOGRAFT (Right: Knee)  Patient Location: PACU  Anesthesia Type:General  Level of Consciousness: drowsy  Airway & Oxygen Therapy: Patient Spontanous Breathing and Patient connected to face mask oxygen  Post-op Assessment: Report given to RN and Post -op Vital signs reviewed and stable  Post vital signs: Reviewed and stable  Last Vitals:  Vitals Value Taken Time  BP 87/55 05/04/23 1330  Temp 36.3 C 05/04/23 1330  Pulse 86 05/04/23 1336  Resp 17 05/04/23 1336  SpO2 97 % 05/04/23 1336  Vitals shown include unfiled device data.  Last Pain:  Vitals:   05/04/23 1004  TempSrc: Oral  PainSc: 4       Patients Stated Pain Goal: 7 (05/04/23 1004)  Complications: No notable events documented.

## 2023-05-04 NOTE — Anesthesia Procedure Notes (Signed)
Procedure Name: LMA Insertion Date/Time: 05/04/2023 11:37 AM  Performed by: Yolanda Bonine, CRNAPre-anesthesia Checklist: Patient identified, Emergency Drugs available, Suction available and Patient being monitored Patient Re-evaluated:Patient Re-evaluated prior to induction Oxygen Delivery Method: Circle system utilized Preoxygenation: Pre-oxygenation with 100% oxygen Induction Type: IV induction Ventilation: Mask ventilation without difficulty LMA: LMA inserted LMA Size: 4.0 Dental Injury: Teeth and Oropharynx as per pre-operative assessment

## 2023-05-04 NOTE — Anesthesia Preprocedure Evaluation (Addendum)
Anesthesia Evaluation  Patient identified by MRN, date of birth, ID band Patient awake    Reviewed: Allergy & Precautions, NPO status , Patient's Chart, lab work & pertinent test results  Airway Mallampati: II  TM Distance: >3 FB Neck ROM: Full    Dental no notable dental hx.    Pulmonary sleep apnea and Continuous Positive Airway Pressure Ventilation    Pulmonary exam normal        Cardiovascular Normal cardiovascular exam     Neuro/Psych  Headaches PSYCHIATRIC DISORDERS  Depression Bipolar Disorder      GI/Hepatic negative GI ROS, Neg liver ROS,,,  Endo/Other  Patient on GLP-1 Agonist  Renal/GU negative Renal ROS     Musculoskeletal negative musculoskeletal ROS (+)    Abdominal  (+) + obese  Peds  Hematology negative hematology ROS (+)   Anesthesia Other Findings RIGHT MEDIAL PATELLOFEMORAL LIGAMENT TEAR  Reproductive/Obstetrics Hcg negative                             Anesthesia Physical Anesthesia Plan  ASA: 2  Anesthesia Plan: General   Post-op Pain Management:    Induction: Intravenous  PONV Risk Score and Plan: 3 and Ondansetron, Dexamethasone, Midazolam and Treatment may vary due to age or medical condition  Airway Management Planned: LMA  Additional Equipment:   Intra-op Plan:   Post-operative Plan: Extubation in OR  Informed Consent: I have reviewed the patients History and Physical, chart, labs and discussed the procedure including the risks, benefits and alternatives for the proposed anesthesia with the patient or authorized representative who has indicated his/her understanding and acceptance.     Dental advisory given  Plan Discussed with: CRNA  Anesthesia Plan Comments: (Potential post-op adductor canal block discussed)       Anesthesia Quick Evaluation

## 2023-05-04 NOTE — Op Note (Signed)
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  Upon examination the joint there was some mild fraying of the undersurface patella but the trochlea was intact and quite flat.  Based on her preop planning we felt that a 8 mm correction and deepening of the trochlea was appropriate.  Case went routinely and good tracking was noted of the patella even without MPFL tension was improved with MPFL under tension.  Successful completion of the planned procedure.    Post-operative plan: The patient will be dc home.  The patient will be weightbearing to tolerance.  DVT prophylaxis Aspirin 81 mg twice daily for 6 weeks.  Pain control with PRN pain medication preferring oral medicines.  Follow up plan will be scheduled in approximately 7 days for incision check and XR.  Post-Op Diagnosis: Same Surgeons:Primary: Bjorn Pippin, MD Assistants:Caroline McBane, PA-C Location: [facility] OR ROOM 1 Anesthesia: General with local Antibiotics: Ancef 2 g with local vancomycin powder 1 g at the surgical site Tourniquet time: 70 Estimated Blood Loss: 50 Complications: None Specimens: None Implants: Implant Name Type Inv. Item Serial No. Manufacturer Lot No. LRB No. Used Action  ANCH DBL 2.6 SLF-PNCH FIBERTAK - [id number] Anchor ANCH DBL 2.6 SLF-PNCH FIBERTAK  [employer]  Right 1 Implanted  TENDON SEMI-TENDINOSUS - [id number] Bone Implant TENDON SEMI-TENDINOSUS [id number] [facility] [id number] Right 1 Implanted  [facility] DBL 2.6 SLF-PNCH FIBERTAK - [id number] Anchor ANCH DBL 2.6 SLF-PNCH  FIBERTAK  [employer] [id number] Right 1 Implanted  ANCH DBL 2.6 SLF-PNCH FIBERTAK - [id number] Anchor ANCH DBL 2.6 SLF-PNCH FIBERTAK  [employer] [id number] Right 1 Implanted  ANCH DBL 2.6 SLF-PNCH FIBERTAK - [id number] Anchor ANCH DBL 2.6 SLF-PNCH FIBERTAK  [employer] [id number] Right 1 Implanted  ANCHOR PUSHLOCK PEEK 3.5X19.5 - [id number] Anchor ANCHOR PUSHLOCK PEEK 3.5X19.5  [employer] [id number] Right 1 Implanted  ANCHOR PUSHLOCK PEEK 3.5X19.5 - [id number] Anchor ANCHOR PUSHLOCK PEEK 3.5X19.5  [employer] [id number] Right 1 Implanted  ANCHOR PUSHLOCK PEEK 3.5X19.5 - [id number] Anchor ANCHOR PUSHLOCK PEEK 3.5X19.5  [employer] [id number] Right 1 Implanted  ANCHOR PUSHLOCK PEEK 3.5X19.5 - [id number] Anchor ANCHOR PUSHLOCK PEEK 3.5X19.5  [employer] [id number] Right 1 Implanted  ANCHOR PUSHLOCK PEEK 3.5X19.5 - [id number] Anchor ANCHOR PUSHLOCK PEEK 3.5X19.5  [employer] [id number] Right 1 Implanted    Indications for Surgery:   [name] is a 38 y.o. female with recurrent patellofemoral instability.  Benefits and risks of operative and nonoperative management were discussed prior to surgery with patient/guardian(s) and informed consent form was completed.  Specific risks including infection, need for additional surgery, recurrent instability, stiffness amongst others.   Procedure:   The patient was identified properly. Informed consent was obtained and the surgical site was marked. The patient was taken up to suite where general anesthesia was induced. The patient was placed in the supine position with a post against the surgical leg and a nonsterile tourniquet applied. The surgical leg was then prepped and draped usual sterile fashion.  A standard surgical timeout was performed.  2 standard anterior portals were made and diagnostic arthroscopy performed. Please note the findings as noted above.  We cleared the joint as above.  Gentle chondroplasty  performed of the patella.  Synovectomy performed of the  anterior, medial and lateral compartments.    Attention was turned to the proximal medial patella where a proximal medial patellar skin incision was made and carried down through the skin and subcutaneous tissue.  The medial border of the patella was exposed down to layer 3.  We tagged the superficial tissue which was consistent with the attenuated MPFL remnant.  The joint was not entered.  We then used 2 - 2.6 mm arthrex fiber tack anchor placed at the proximal 25% and 50% marks of the patella from proximal to distal transversely.  These would be used to hold our graft in place using a luggage loop type suture pass.    Our graft was prepped in the form of a doubled over semitendinosus that passed through a 7.28mm tunnel.   This was secured as above to the patella at its mid portion and the two loose tails were then passed under layer 2 to the medial epicondyle.  We then made a 3 cm approach starting at the medial epicondyle extending just proximal and posterior.  We took care to dissect the superficial tissues bluntly and used blunt retraction to ensure that the neurovascular structures were out of our field.   We identified the medial epicondyle.  Blunt dissection was performed below the fascia outside of the capsule from the medial patella to the adductor tubercle.    Using a  pin under fluoroscopy image intensification, the  pin was placed at [place] point and placed from a posterior to anterior and distal to proximal direction.  Good position was noted on the fluoroscopic views.  We placed a 2.6 fiber tack anchor in this position.  Attention was turned to the trochleoplasty.  We extended our lateral portal incision longitudinally.  Went through skin sharply chief hemostasis we progressed.  We identified the lateral retinaculum and made a small cuff off the lateral border the patella.  Went through the fascial layer without issue.  At that point we were able to identify the joint.  We performed a  series of releases in order to protect the patellar tendon and dislocate the patella medially.  We used a 4 mm Steinmann pin to hold in this position.  That point we identified that the trochlea was appropriate for osteotomy.  We used a series of osteotome and a bur device from [employer] to create a distal femoral osteotomy and elevate the anterior portion of the trochlea.  We then were able to contour the distal femur and reshape the area transplanting this cartilage into the newly formed groove.  This was essentially performing a distal femoral osteotomy and osteochondral autograft transplant.  We had to fix her transplanted cartilage using a series of three 3.2 mm peek push locks in the new groove which was created about 1 cm deep and 1 cm lateral to the original groove.  We Daisy chain linked #1 Vicryl and used 4 strands in the central groove portion of the fixation.  We bone grafted laterally to create a lateral bumper underneath our graft.  We then brought to additional 3.2 push locks over the far lateral and far medial aspect of the flap.  We created a deep well-fixed groove with good centering of the patella through range of motion.  Attention was turned back to the MPFL.  We shuttled our sutures from the patellar incision to the femoral incision.  We then were able to use a 2.6 fiber tack anchor  to secure with an onlay staple type configuration the graft in the appropriate position.  With the knee in 30 degrees of flexion, the graft was appropriately tensioned to allow for appropriate medial lateral stability with approximately 10mm of lateral translation without being excessively tight.  Excellent tension was noted.    There was adequate medial lateral stability, but the patella was not excessively tight.  The arthroscope was placed back in the joint to check position and translation of the patella before and after graft fixation noting it to be stable and articulating within the trochlea.  The  native MPFL tissue was repaired at both its patellar and femoral origins in a pants over vest style fashion to imbricate this loose tissue with 0 Vicryl.  All incisions were irrigated copiously and vancomycin powder was placed prior to closure in a multilayer fashion with absorbable suture.  Lateral incision was closed taking care to not over tension the lateral retinaculum.  Finishing layers were absorbable suture.  Sterile dressing and knee immobilizer hinge were placed locked in extension.  Patient was woken and taken to PACU in stable condition.  Incisions closed with absorbable suture. The patient was awoken from general anesthesia and taken to the PACU in stable condition without complication.   Alfonse Alpers, PA-C, present and scrubbed throughout the case, critical for completion in a timely fashion, and for retraction, instrumentation, closure.

## 2023-05-04 NOTE — Anesthesia Postprocedure Evaluation (Signed)
Anesthesia Post Note  Patient: [name]  Procedure(s) Performed: MEDIAL PATELLA FEMORAL LIGAMENT RECONSTRUCTION WITH OSTEOCHONDRAL AUTOGRAFT (Right: Knee)     Patient location during evaluation: PACU Anesthesia Type: General Level of consciousness: awake Pain management: pain level controlled Vital Signs Assessment: post-procedure vital signs reviewed and stable Respiratory status: spontaneous breathing, nonlabored ventilation and respiratory function stable Cardiovascular status: blood pressure returned to baseline and stable Postop Assessment: no apparent nausea or vomiting Anesthetic complications: no   No notable events documented.  Last Vitals:  Vitals:   05/04/23 1530 05/04/23 1547  BP: 96/64 111/89  Pulse: 92 90  Resp: 18 16  Temp:  (!) 36.3 C  SpO2: 97% 95%    Last Pain:  Vitals:   05/04/23 1547  TempSrc:   PainSc: 6                  Terresa Marlett P Dequan Kindred

## 2023-05-04 NOTE — Interval H&P Note (Signed)
All questions answered, patient wants to proceed with procedure. ? ?

## 2023-05-05 ENCOUNTER — Encounter: Payer: Self-pay | Admitting: Family Medicine

## 2023-05-05 ENCOUNTER — Ambulatory Visit (INDEPENDENT_AMBULATORY_CARE_PROVIDER_SITE_OTHER): Payer: Medicaid Other | Admitting: Family Medicine

## 2023-05-05 VITALS — BP 102/63 | HR 111 | Temp 97.0°F | Ht 66.0 in | Wt 243.0 lb

## 2023-05-05 DIAGNOSIS — E782 Mixed hyperlipidemia: Secondary | ICD-10-CM | POA: Diagnosis not present

## 2023-05-05 DIAGNOSIS — Z1159 Encounter for screening for other viral diseases: Secondary | ICD-10-CM | POA: Diagnosis not present

## 2023-05-05 DIAGNOSIS — Z0001 Encounter for general adult medical examination with abnormal findings: Secondary | ICD-10-CM | POA: Diagnosis not present

## 2023-05-05 DIAGNOSIS — Z0000 Encounter for general adult medical examination without abnormal findings: Secondary | ICD-10-CM | POA: Diagnosis not present

## 2023-05-05 DIAGNOSIS — Z79899 Other long term (current) drug therapy: Secondary | ICD-10-CM

## 2023-05-05 DIAGNOSIS — Z1329 Encounter for screening for other suspected endocrine disorder: Secondary | ICD-10-CM | POA: Diagnosis not present

## 2023-05-05 DIAGNOSIS — E559 Vitamin D deficiency, unspecified: Secondary | ICD-10-CM

## 2023-05-05 LAB — BAYER DCA HB A1C WAIVED: HB A1C (BAYER DCA - WAIVED): 5.4 % (ref 4.8–5.6)
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
## 2023-05-08 ENCOUNTER — Encounter: Payer: Self-pay | Admitting: Family Medicine

## 2023-05-08 LAB — CMP14+EGFR
ALT: 42 [IU]/L — ABNORMAL HIGH (ref 0–32)
AST: 27 [IU]/L (ref 0–40)
Albumin: 4.3 g/dL (ref 3.9–4.9)
Alkaline Phosphatase: 71 [IU]/L (ref 44–121)
BUN/Creatinine Ratio: 7 — ABNORMAL LOW (ref 9–23)
BUN: 7 mg/dL (ref 6–20)
Bilirubin Total: 0.6 mg/dL (ref 0.0–1.2)
CO2: 18 mmol/L — ABNORMAL LOW (ref 20–29)
Calcium: 9.5 mg/dL (ref 8.7–10.2)
Chloride: 104 mmol/L (ref 96–106)
Creatinine, Ser: 0.94 mg/dL (ref 0.57–1.00)
Globulin, Total: 1.9 g/dL (ref 1.5–4.5)
Glucose: 120 mg/dL — ABNORMAL HIGH (ref 70–99)
Potassium: 4.1 mmol/L (ref 3.5–5.2)
Sodium: 139 mmol/L (ref 134–144)
Total Protein: 6.2 g/dL (ref 6.0–8.5)
eGFR: 81 mL/min/{1.73_m2} (ref >59)

## 2023-05-08 LAB — CBC WITH DIFFERENTIAL/PLATELET
Basophils Absolute: 0 10*3/uL (ref 0.0–0.2)
Basos: 0 %
EOS (ABSOLUTE): 0 10*3/uL (ref 0.0–0.4)
Eos: 0 %
Hematocrit: 34.3 % (ref 34.0–46.6)
Hemoglobin: 11.5 g/dL (ref 11.1–15.9)
Immature Grans (Abs): 0.1 10*3/uL (ref 0.0–0.1)
Immature Granulocytes: 1 %
Lymphocytes Absolute: 0.9 10*3/uL (ref 0.7–3.1)
Lymphs: 9 %
MCH: 31.9 pg (ref 26.6–33.0)
MCHC: 33.5 g/dL (ref 31.5–35.7)
MCV: 95 fL (ref 79–97)
Monocytes Absolute: 0.6 10*3/uL (ref 0.1–0.9)
Monocytes: 6 %
Neutrophils Absolute: 8.5 10*3/uL — ABNORMAL HIGH (ref 1.4–7.0)
Neutrophils: 84 %
Platelets: 281 10*3/uL (ref 150–450)
RBC: 3.61 x10E6/uL — ABNORMAL LOW (ref 3.77–5.28)
RDW: 12.8 % (ref 11.7–15.4)
WBC: 10.1 10*3/uL (ref 3.4–10.8)

## 2023-05-08 LAB — THYROID PANEL WITH TSH
Free Thyroxine Index: 2.8 (ref 1.2–4.9)
T3 Uptake Ratio: 26 % (ref 24–39)
T4, Total: 10.7 ug/dL (ref 4.5–12.0)
TSH: 0.348 u[IU]/mL — ABNORMAL LOW (ref 0.450–4.500)

## 2023-05-08 LAB — LAMOTRIGINE LEVEL: Lamotrigine Lvl: 6 ug/mL (ref 2.0–20.0)

## 2023-05-08 LAB — LIPID PANEL
Chol/HDL Ratio: 2.2 {ratio} (ref 0.0–4.4)
Cholesterol, Total: 123 mg/dL (ref 100–199)
HDL: 57 mg/dL (ref >39)
LDL Chol Calc (NIH): 49 mg/dL (ref 0–99)
Triglycerides: 88 mg/dL (ref 0–149)
VLDL Cholesterol Cal: 17 mg/dL (ref 5–40)

## 2023-05-08 LAB — HEPATITIS C ANTIBODY

## 2023-05-15 ENCOUNTER — Encounter: Payer: Self-pay | Admitting: Neurology

## 2023-05-15 DIAGNOSIS — G4733 Obstructive sleep apnea (adult) (pediatric): Secondary | ICD-10-CM | POA: Diagnosis not present

## 2023-05-16 ENCOUNTER — Encounter (HOSPITAL_BASED_OUTPATIENT_CLINIC_OR_DEPARTMENT_OTHER): Payer: Self-pay | Admitting: Orthopaedic Surgery

## 2023-05-17 ENCOUNTER — Other Ambulatory Visit (HOSPITAL_COMMUNITY): Payer: Self-pay

## 2023-05-17 ENCOUNTER — Telehealth: Payer: Self-pay

## 2023-05-17 NOTE — Telephone Encounter (Signed)
*  [facility]  Pharmacy Patient Advocate Encounter   Received notification from Patient Advice Request messages that prior authorization for Emgality  120MG /ML auto-injectors (migraine)  is required/requested.   Insurance verification completed.   The patient is insured through [employer] .   Per test claim: PA required; PA submitted to above mentioned insurance via CoverMyMeds Key/confirmation #/EOC [facility] Status is pending

## 2023-05-17 NOTE — Telephone Encounter (Signed)
 PA request has been Submitted. New Encounter created for follow up. For additional info see Pharmacy Prior Auth telephone encounter from 02/05.

## 2023-05-17 NOTE — Telephone Encounter (Signed)
 Pharmacy Patient Advocate Encounter  Received notification from [facility] that Prior Authorization for EMGALITY  120MG  has been APPROVED from 2.5.25 to 2.5.26. Ran test claim, Copay is $4. This test claim was processed through [facility]- copay amounts may vary at other pharmacies due to pharmacy/plan contracts, or as the patient moves through the different stages of their insurance plan.

## 2023-05-17 NOTE — Telephone Encounter (Signed)
*  [facility]  Pharmacy Patient Advocate Encounter   Received notification from CoverMyMeds that prior authorization for Candesartan  Cilexetil 8MG  tablets  is required/requested.   Insurance verification completed.   The patient is insured through [facility] .   Per test claim: PA required; PA submitted to above mentioned insurance via CoverMyMeds Key/confirmation #/[id number] Status is pending

## 2023-05-18 ENCOUNTER — Other Ambulatory Visit: Payer: Self-pay

## 2023-05-18 ENCOUNTER — Ambulatory Visit: Payer: Medicaid Other | Attending: Orthopaedic Surgery

## 2023-05-18 DIAGNOSIS — M25661 Stiffness of right knee, not elsewhere classified: Secondary | ICD-10-CM | POA: Insufficient documentation

## 2023-05-18 DIAGNOSIS — M6281 Muscle weakness (generalized): Secondary | ICD-10-CM | POA: Diagnosis not present

## 2023-05-18 DIAGNOSIS — M25561 Pain in right knee: Secondary | ICD-10-CM | POA: Insufficient documentation
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
## 2023-05-18 NOTE — Telephone Encounter (Signed)
 Pharmacy Patient Advocate Encounter  Received notification from [facility] that Prior Authorization for Candesartan  Cilexetil 8MG  tablets has been DENIED.  Full denial letter will be uploaded to the media tab. See denial reason below.   PA #/Case ID/Reference #: PA Case ID #: [id number]

## 2023-05-23 ENCOUNTER — Ambulatory Visit: Payer: Medicaid Other | Admitting: *Deleted

## 2023-05-23 DIAGNOSIS — M25561 Pain in right knee: Secondary | ICD-10-CM

## 2023-05-23 DIAGNOSIS — M25661 Stiffness of right knee, not elsewhere classified: Secondary | ICD-10-CM

## 2023-05-23 DIAGNOSIS — M6281 Muscle weakness (generalized): Secondary | ICD-10-CM
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
## 2023-05-24 ENCOUNTER — Telehealth: Payer: Self-pay | Admitting: Neurology

## 2023-05-24 ENCOUNTER — Telehealth (INDEPENDENT_AMBULATORY_CARE_PROVIDER_SITE_OTHER): Payer: Medicaid Other | Admitting: Neurology

## 2023-05-24 ENCOUNTER — Encounter: Payer: Self-pay | Admitting: Neurology

## 2023-05-24 DIAGNOSIS — G43709 Chronic migraine without aura, not intractable, without status migrainosus: Secondary | ICD-10-CM | POA: Diagnosis not present

## 2023-05-24 DIAGNOSIS — R519 Headache, unspecified: Secondary | ICD-10-CM

## 2023-05-24 DIAGNOSIS — E782 Mixed hyperlipidemia: Secondary | ICD-10-CM

## 2023-05-24 DIAGNOSIS — I10 Essential (primary) hypertension: Secondary | ICD-10-CM | POA: Diagnosis not present

## 2023-05-24 DIAGNOSIS — E785 Hyperlipidemia, unspecified: Secondary | ICD-10-CM

## 2023-05-24 DIAGNOSIS — G43711 Chronic migraine without aura, intractable, with status migrainosus: Secondary | ICD-10-CM

## 2023-05-24 DIAGNOSIS — G4719 Other hypersomnia: Secondary | ICD-10-CM

## 2023-05-24 MED ORDER — WEGOVY 1.7 MG/0.75ML ~~LOC~~ SOAJ: 1.7000 mg | SUBCUTANEOUS | 3 refills | Status: DC

## 2023-05-24 MED ORDER — SODIUM CHLORIDE 0.9 % IV SOLN: 100.0000 mg | INTRAVENOUS | 4 refills | Status: DC
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
## 2023-05-24 NOTE — Telephone Encounter (Signed)
Vyepti order has been signed (by Dr Lucia Gaskins) and given to [facility] for processing.

## 2023-05-24 NOTE — Telephone Encounter (Signed)
Please start vyepti protocol  She has improvement but still with 15 moderate migraine days a month and At least and > 15 total headache days a month . 3 months . Will stop emgality and start vyepti.     sodium chloride 0.9 % SOLN 100 mL with Eptinezumab-jjmr 100 MG/ML SOLN 100 mg    Sig: Inject 100 mg into the vein every 3 (three) months.    Dispense:  100 mg    Refill:  4   G43.711  Meds tried > 3 months: imitrex, fioricet, propranol and b beta blockas are contraindicated due to asthma, doxycline(TCA), gabapentin, topamax, lamictal, trazone, maxal, relpax, venlafaxine and cymbalta, seroquel, depakote, candesartan, emgality 4 injections so far and 5th this month, aimovig and qulipta contraindicated due to constipation

## 2023-05-24 NOTE — Telephone Encounter (Signed)
LVM and sent MyChart msg asking pt to cb and schedule 4 month VV.

## 2023-05-24 NOTE — Telephone Encounter (Signed)
4ish month video with dr Lucia Gaskins please thank you !

## 2023-05-24 NOTE — Telephone Encounter (Signed)
Pt called to schedule her 4 mo f/u, she said she was told it could be another my chart vv with MD

## 2023-05-24 NOTE — Patient Instructions (Addendum)
START VYEPTI  Options for better migraine prevention  If we can get vyepti and it is paid for I would switch emgality directly to vyepti(spoke to [place] at [place] will try) If for some reason this is not possible I would try Ajovy, also botox for migraines is an option as is qulipta Stay on candesartan, helping, no side effects Continue emgality until start vyepti and you can overlap a month She had tubal ligation no need for pregnancy test  2. For botox in the future if needed she would still qualify she does have migraine with aura but at least 10 migraine days a month without aura and > 15 total headache days a month   Obesity and OSA:  Sleep study: she was diagnosed with OSA and on cpap now  BMI almost 40: She is on 1mg  wegovy and we can continue to increase as tolerated she has lost 10 pounds She has had multiple imaging in the past but low threshold to repeat MRI brain HLD: increase creator due to stroke risk in migriane with aura need to address all modifiable risk favtors like HTN and HLD Cut out sodas and other migraine triggers the diet soda does not seem to bother her the splenda Test HgbA1c 5.2 We increased cholesterol med and she doing better  Weight loss will help with medical problems and migraines She is very compliant and lovely  Eptinezumab Injection What is this medication? EPTINEZUMAB (EP ti NEZ ue mab) prevents migraines. It works by blocking a substance in the body that causes migraines. It is a monoclonal antibody. This medicine may be used for other purposes; ask your health care provider or pharmacist if you have questions. COMMON BRAND NAME(S): Vyepti What should I tell my care team before I take this medication? They need to know if you have any of these conditions: An unusual or allergic reaction to eptinezumab, other medications, foods, dyes, or preservatives Pregnant or trying to get pregnant Breast-feeding How should I use this medication? This  medication is injected into a vein. It is given by your care team in a hospital or clinic setting. Talk to your care team about the use of this medication in children. Special care may be needed. Overdosage: If you think you have taken too much of this medicine contact a poison control center or emergency room at once. NOTE: This medicine is only for you. Do not share this medicine with others. What if I miss a dose? Keep appointments for follow-up doses. It is important not to miss your dose. Call your care team if you are unable to keep an appointment. What may interact with this medication? Interactions are not expected. This list may not describe all possible interactions. Give your health care provider a list of all the medicines, herbs, non-prescription drugs, or dietary supplements you use. Also tell them if you smoke, drink alcohol, or use illegal drugs. Some items may interact with your medicine. What should I watch for while using this medication? Your condition will be monitored carefully while you are receiving this medication. What side effects may I notice from receiving this medication? Side effects that you should report to your care team as soon as possible: Allergic reactions or angioedema--skin rash, itching or hives, swelling of the face, eyes, lips, tongue, arms, or legs, trouble swallowing or breathing Side effects that usually do not require medical attention (report to your care team if they continue or are bothersome): Runny or stuffy nose This list may not  describe all possible side effects. Call your doctor for medical advice about side effects. You may report side effects to FDA at 1-800-FDA-1088. Where should I keep my medication? This medication is given in a hospital or clinic. It will not be stored at home. NOTE: This sheet is a summary. It may not cover all possible information. If you have questions about this medicine, talk to your doctor, pharmacist, or health  care provider.  2024 Elsevier/Gold Standard (2021-05-24 00:00:00)

## 2023-05-24 NOTE — Telephone Encounter (Signed)
Vyepti 100 mg IV every 3 months order form completed and is pending Dr Trevor Mace signature.

## 2023-05-26 ENCOUNTER — Ambulatory Visit: Payer: Medicaid Other

## 2023-05-26 DIAGNOSIS — M25661 Stiffness of right knee, not elsewhere classified: Secondary | ICD-10-CM

## 2023-05-26 DIAGNOSIS — M6281 Muscle weakness (generalized): Secondary | ICD-10-CM | POA: Diagnosis not present

## 2023-05-26 DIAGNOSIS — M25561 Pain in right knee: Secondary | ICD-10-CM

## 2023-05-26 DIAGNOSIS — R400 Somnolence: Secondary | ICD-10-CM | POA: Diagnosis not present

## 2023-05-26 DIAGNOSIS — G4733 Obstructive sleep apnea (adult) (pediatric): Secondary | ICD-10-CM | POA: Diagnosis not present
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
## 2023-05-29 ENCOUNTER — Ambulatory Visit: Payer: Medicaid Other | Admitting: Physical Therapy

## 2023-05-29 DIAGNOSIS — M25661 Stiffness of right knee, not elsewhere classified: Secondary | ICD-10-CM | POA: Diagnosis not present

## 2023-05-29 DIAGNOSIS — M6281 Muscle weakness (generalized): Secondary | ICD-10-CM | POA: Diagnosis not present

## 2023-05-29 DIAGNOSIS — M25561 Pain in right knee: Secondary | ICD-10-CM
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
## 2023-05-31 ENCOUNTER — Other Ambulatory Visit: Payer: Self-pay

## 2023-05-31 DIAGNOSIS — G43709 Chronic migraine without aura, not intractable, without status migrainosus: Secondary | ICD-10-CM
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
## 2023-06-01 ENCOUNTER — Encounter: Payer: Medicaid Other | Admitting: *Deleted

## 2023-06-01 ENCOUNTER — Encounter (HOSPITAL_COMMUNITY): Payer: Self-pay | Admitting: Psychiatry

## 2023-06-01 ENCOUNTER — Telehealth (HOSPITAL_COMMUNITY): Payer: Medicaid Other | Admitting: Psychiatry

## 2023-06-01 DIAGNOSIS — F4310 Post-traumatic stress disorder, unspecified: Secondary | ICD-10-CM | POA: Diagnosis not present

## 2023-06-01 DIAGNOSIS — F603 Borderline personality disorder: Secondary | ICD-10-CM

## 2023-06-01 DIAGNOSIS — F411 Generalized anxiety disorder: Secondary | ICD-10-CM | POA: Diagnosis not present

## 2023-06-01 MED ORDER — GABAPENTIN 100 MG PO CAPS: 200.0000 mg | ORAL_CAPSULE | Freq: Three times a day (TID) | ORAL | 2 refills | Status: DC | PRN

## 2023-06-01 MED ORDER — LAMOTRIGINE 100 MG PO TABS: 200.0000 mg | ORAL_TABLET | Freq: Every day | ORAL | 0 refills | Status: DC

## 2023-06-01 MED ORDER — VORTIOXETINE HBR 10 MG PO TABS: 10.0000 mg | ORAL_TABLET | Freq: Every day | ORAL | 2 refills | Status: DC

## 2023-06-01 MED ORDER — TRAZODONE HCL 100 MG PO TABS: 100.0000 mg | ORAL_TABLET | Freq: Every day | ORAL | 0 refills | Status: DC

## 2023-06-06 ENCOUNTER — Ambulatory Visit: Payer: Medicaid Other | Admitting: *Deleted

## 2023-06-06 ENCOUNTER — Encounter: Payer: Self-pay | Admitting: *Deleted

## 2023-06-06 DIAGNOSIS — M25561 Pain in right knee: Secondary | ICD-10-CM | POA: Diagnosis not present

## 2023-06-06 DIAGNOSIS — M6281 Muscle weakness (generalized): Secondary | ICD-10-CM

## 2023-06-06 DIAGNOSIS — M25661 Stiffness of right knee, not elsewhere classified: Secondary | ICD-10-CM | POA: Diagnosis not present
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
## 2023-06-07 NOTE — Telephone Encounter (Signed)
 Received word from [facility] that pt's insurance (medicaid) doesn't cover Vyepti. Per Dr Trevor Mace office note, would try Ajovy next.

## 2023-06-08 ENCOUNTER — Ambulatory Visit: Payer: Medicaid Other

## 2023-06-08 ENCOUNTER — Encounter: Payer: Self-pay | Admitting: Neurology

## 2023-06-08 DIAGNOSIS — M25661 Stiffness of right knee, not elsewhere classified: Secondary | ICD-10-CM | POA: Diagnosis not present

## 2023-06-08 DIAGNOSIS — M25561 Pain in right knee: Secondary | ICD-10-CM | POA: Diagnosis not present

## 2023-06-08 DIAGNOSIS — M6281 Muscle weakness (generalized): Secondary | ICD-10-CM | POA: Diagnosis not present

## 2023-06-08 MED ORDER — CANDESARTAN CILEXETIL 8 MG PO TABS: 8.0000 mg | ORAL_TABLET | Freq: Every day | ORAL | 2 refills | Status: DC
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
## 2023-06-08 NOTE — Addendum Note (Signed)
 Addended by: Bertram Savin on: 06/08/2023 04:21 PM   Modules accepted: Orders

## 2023-06-09 ENCOUNTER — Other Ambulatory Visit (HOSPITAL_COMMUNITY): Payer: Self-pay

## 2023-06-09 ENCOUNTER — Telehealth: Payer: Self-pay | Admitting: Pharmacist

## 2023-06-09 ENCOUNTER — Telehealth: Payer: Self-pay

## 2023-06-09 ENCOUNTER — Other Ambulatory Visit: Payer: Self-pay | Admitting: Neurology

## 2023-06-09 DIAGNOSIS — G43709 Chronic migraine without aura, not intractable, without status migrainosus: Secondary | ICD-10-CM

## 2023-06-09 MED ORDER — AJOVY 225 MG/1.5ML ~~LOC~~ SOAJ: 225.0000 mg | SUBCUTANEOUS | 11 refills | Status: AC

## 2023-06-09 NOTE — Telephone Encounter (Signed)
 As of right now, I do not see any openings on NP schedule in this timeframe or on Dr. Vickey Huger schedule. Will forward to POD 3 to see if pt can be worked in somewhere

## 2023-06-09 NOTE — Telephone Encounter (Signed)
 Pharmacy Patient Advocate Encounter  Received notification from [facility] that Prior Authorization for Candesartan cilexetil 8 mg tablet has been denied.  PA #/Case ID/Reference #: [id number]

## 2023-06-09 NOTE — Telephone Encounter (Signed)
 Pharmacy Patient Advocate Encounter   Received notification from Patient Pharmacy that prior authorization for Candesartan Cilexetil 8MG  tablets is required/requested.   Insurance verification completed.   The patient is insured through [facility] .   Per test claim: PA required; PA submitted to above mentioned insurance via CoverMyMeds Key/confirmation #/EOC [id number] Status is pending

## 2023-06-09 NOTE — Telephone Encounter (Signed)
 Per resmed airview, she was set up with autopap machine 04/14/23. Needs appt between 05/15/23-07/13/23.

## 2023-06-12 ENCOUNTER — Ambulatory Visit: Payer: Medicaid Other | Attending: Orthopaedic Surgery | Admitting: Physical Therapy

## 2023-06-12 DIAGNOSIS — G8929 Other chronic pain: Secondary | ICD-10-CM | POA: Diagnosis not present

## 2023-06-12 DIAGNOSIS — M6281 Muscle weakness (generalized): Secondary | ICD-10-CM | POA: Insufficient documentation

## 2023-06-12 DIAGNOSIS — G4733 Obstructive sleep apnea (adult) (pediatric): Secondary | ICD-10-CM | POA: Diagnosis not present

## 2023-06-12 DIAGNOSIS — M25561 Pain in right knee: Secondary | ICD-10-CM | POA: Insufficient documentation

## 2023-06-12 DIAGNOSIS — M25661 Stiffness of right knee, not elsewhere classified: Secondary | ICD-10-CM | POA: Diagnosis not present
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
## 2023-06-12 NOTE — Telephone Encounter (Signed)
 Pharmacy Patient Advocate Encounter  Received notification from [facility] that Prior Authorization for Candesartan 8mg  Tablet has been DENIED.  Full denial letter will be uploaded to the media tab. See denial reason below.   PA #/Case ID/Reference #: [id number]

## 2023-06-12 NOTE — Telephone Encounter (Signed)
 Noted thanks. I notified the patient. She can continue to use a good rx coupon.

## 2023-06-12 NOTE — Telephone Encounter (Signed)
 Noted thanks. I have told patient.

## 2023-06-15 ENCOUNTER — Ambulatory Visit: Payer: Medicaid Other | Admitting: Physical Therapy

## 2023-06-15 DIAGNOSIS — M6281 Muscle weakness (generalized): Secondary | ICD-10-CM

## 2023-06-15 DIAGNOSIS — M25661 Stiffness of right knee, not elsewhere classified: Secondary | ICD-10-CM

## 2023-06-15 DIAGNOSIS — G8929 Other chronic pain: Secondary | ICD-10-CM | POA: Diagnosis not present

## 2023-06-15 DIAGNOSIS — M25561 Pain in right knee: Secondary | ICD-10-CM | POA: Diagnosis not present
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
## 2023-06-20 ENCOUNTER — Ambulatory Visit

## 2023-06-20 DIAGNOSIS — M6281 Muscle weakness (generalized): Secondary | ICD-10-CM | POA: Diagnosis not present

## 2023-06-20 DIAGNOSIS — M25661 Stiffness of right knee, not elsewhere classified: Secondary | ICD-10-CM | POA: Diagnosis not present

## 2023-06-20 DIAGNOSIS — G8929 Other chronic pain: Secondary | ICD-10-CM

## 2023-06-20 DIAGNOSIS — M25561 Pain in right knee: Secondary | ICD-10-CM | POA: Diagnosis not present
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
## 2023-06-22 ENCOUNTER — Ambulatory Visit

## 2023-06-22 DIAGNOSIS — M6281 Muscle weakness (generalized): Secondary | ICD-10-CM

## 2023-06-22 DIAGNOSIS — G8929 Other chronic pain: Secondary | ICD-10-CM | POA: Diagnosis not present

## 2023-06-22 DIAGNOSIS — M25661 Stiffness of right knee, not elsewhere classified: Secondary | ICD-10-CM | POA: Diagnosis not present

## 2023-06-22 DIAGNOSIS — M25561 Pain in right knee: Secondary | ICD-10-CM | POA: Diagnosis not present
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
## 2023-06-23 DIAGNOSIS — R400 Somnolence: Secondary | ICD-10-CM | POA: Diagnosis not present

## 2023-06-23 DIAGNOSIS — G4733 Obstructive sleep apnea (adult) (pediatric): Secondary | ICD-10-CM | POA: Diagnosis not present

## 2023-06-27 ENCOUNTER — Ambulatory Visit

## 2023-06-27 DIAGNOSIS — M25661 Stiffness of right knee, not elsewhere classified: Secondary | ICD-10-CM | POA: Diagnosis not present

## 2023-06-27 DIAGNOSIS — M6281 Muscle weakness (generalized): Secondary | ICD-10-CM

## 2023-06-27 DIAGNOSIS — M25561 Pain in right knee: Secondary | ICD-10-CM

## 2023-06-27 DIAGNOSIS — G8929 Other chronic pain: Secondary | ICD-10-CM | POA: Diagnosis not present
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
## 2023-06-29 ENCOUNTER — Ambulatory Visit

## 2023-06-29 DIAGNOSIS — M25661 Stiffness of right knee, not elsewhere classified: Secondary | ICD-10-CM | POA: Diagnosis not present

## 2023-06-29 DIAGNOSIS — M6281 Muscle weakness (generalized): Secondary | ICD-10-CM | POA: Diagnosis not present

## 2023-06-29 DIAGNOSIS — M25561 Pain in right knee: Secondary | ICD-10-CM

## 2023-06-29 DIAGNOSIS — G8929 Other chronic pain: Secondary | ICD-10-CM | POA: Diagnosis not present
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
## 2023-06-29 NOTE — Progress Notes (Signed)
 Setup date: 04/14/23

## 2023-07-02 NOTE — Progress Notes (Unsigned)
 PATIENT: [name] DOB: Jan 13, 1986  REASON FOR VISIT: follow up HISTORY FROM: patient PRIMARY NEUROLOGIST:   HISTORY OF PRESENT ILLNESS: Today 07/02/23:    HISTORY   REVIEW OF SYSTEMS: Out of a complete 14 system review of symptoms, the patient complains only of the following symptoms, and all other reviewed systems are negative.  FSS ESS  ALLERGIES: Allergies  Allergen Reactions   Skin Bleaching [Hydroquinone] Anaphylaxis and Shortness Of Breath    Any bleach products; Facial swelling, SOB   Sodium Hypochlorite Shortness Of Breath    Any bleach products    Keflex [Cephalexin] Rash   Latex Rash    HOME MEDICATIONS: Outpatient Medications Prior to Visit  Medication Sig Dispense Refill   albuterol (VENTOLIN HFA) 108 (90 Base) MCG/ACT inhaler Inhale 1-2 puffs into the lungs every 6 (six) hours as needed for wheezing or shortness of breath. 18 g 0   candesartan (ATACAND) 8 MG tablet Take 1 tablet (8 mg total) by mouth daily. 60 tablet 2   clindamycin (CLEOCIN T) 1 % SWAB Apply topically 2 (two) times daily.     doxycycline (VIBRA-TABS) 100 MG tablet Take 100 mg by mouth 2 (two) times daily.     Fremanezumab-vfrm (AJOVY) 225 MG/1.5ML SOAJ Inject 225 mg into the skin every 30 (thirty) days. 1.5 mL 11   gabapentin (NEURONTIN) 100 MG capsule Take 2 capsules (200 mg total) by mouth 3 (three) times daily as needed (anxiety). 180 capsule 2   lamoTRIgine (LAMICTAL) 100 MG tablet Take 2 tablets (200 mg total) by mouth daily. 180 tablet 0   rosuvastatin (CRESTOR) 40 MG tablet Take 1 tablet (40 mg total) by mouth daily. 90 tablet 4   Semaglutide-Weight Management (WEGOVY) 1.7 MG/0.75ML SOAJ Inject 1.7 mg into the skin once a week. 3 mL 3   sodium chloride 0.9 % SOLN 100 mL with Eptinezumab-jjmr 100 MG/ML SOLN 100 mg Inject 100 mg into the vein every 3 (three) months. 100 mg 4   traZODone (DESYREL) 100 MG tablet Take 1 tablet (100 mg total) by mouth at bedtime. 90 tablet 0    Vitamin D, Ergocalciferol, (DRISDOL) 1.25 MG (50000 UNIT) CAPS capsule TAKE 1 CAPSULE (50,000 UNITS TOTAL) BY MOUTH EVERY 7 (SEVEN) DAYS 5 capsule 5   vortioxetine HBr (TRINTELLIX) 10 MG TABS tablet Take 1 tablet (10 mg total) by mouth daily. 30 tablet 2   No facility-administered medications prior to visit.    PAST MEDICAL HISTORY: Past Medical History:  Diagnosis Date   Borderline personality disorder (HCC)    Headache    Hyperlipidemia 09/25/2021   Hypertension in pregnancy    Knee pain    Nephrolithiasis    Postpartum depression    Preeclampsia    Sleep apnea     PAST SURGICAL HISTORY: Past Surgical History:  Procedure Laterality Date   APPENDECTOMY     CESAREAN SECTION     CYSTOSCOPY/URETEROSCOPY/HOLMIUM LASER/STENT PLACEMENT Right 04/16/2021   Procedure: CYSTOSCOPY RIGHT RETROGRADE PYELOGRAM  URETEROSCOPY/HOLMIUM LASER/STENT PLACEMENT;  Surgeon: Bjorn Pippin, MD;  Location: [facility];  Service: Urology;  Laterality: Right;   HOLMIUM LASER APPLICATION Right 04/16/2021   Procedure: HOLMIUM LASER APPLICATION;  Surgeon: Bjorn Pippin, MD;  Location: [facility];  Service: Urology;  Laterality: Right;   KIDNEY STONE SURGERY  04/16/2021   r-kidney   KNEE SURGERY Right    scoped x2   MEDIAL PATELLOFEMORAL LIGAMENT REPAIR Right 05/04/2023   Procedure: MEDIAL PATELLA FEMORAL LIGAMENT RECONSTRUCTION  WITH OSTEOCHONDRAL AUTOGRAFT;  Surgeon: Bjorn Pippin, MD;  Location: [facility];  Service: Orthopedics;  Laterality: Right;   WISDOM TOOTH EXTRACTION  2010    FAMILY HISTORY: Family History  Problem Relation Age of Onset   Migraines Mother    Hypertension Mother    COPD Mother    Healthy Father    Migraines Maternal Grandmother    Stroke Maternal Grandmother    Diabetes Maternal Grandfather    Hypertension Maternal Grandfather     SOCIAL HISTORY: Social History   Socioeconomic History   Marital status: Significant Other     Spouse name: Not on file   Number of children: Not on file   Years of education: Not on file   Highest education level: Some college, no degree  Occupational History   Not on file  Tobacco Use   Smoking status: Never   Smokeless tobacco: Never  Vaping Use   Vaping status: Never Used  Substance and Sexual Activity   Alcohol use: No   Drug use: Not Currently   Sexual activity: Yes    Birth control/protection: None  Other Topics Concern   Not on file  Social History Narrative   Caffiene 300mg  daily   Working : delivery service    Social Drivers of Health   Financial Resource Strain: High Risk (05/03/2023)   Overall Financial Resource Strain (CARDIA)    Difficulty of Paying Living Expenses: Hard  Food Insecurity: Food Insecurity Present (05/03/2023)   Hunger Vital Sign    Worried About Running Out of Food in the Last Year: Sometimes true    Ran Out of Food in the Last Year: Sometimes true  Transportation Needs: No Transportation Needs (05/03/2023)   PRAPARE - Administrator, Civil Service (Medical): No    Lack of Transportation (Non-Medical): No  Physical Activity: Unknown (05/03/2023)   Exercise Vital Sign    Days of Exercise per Week: Patient declined    Minutes of Exercise per Session: 30 min  Stress: Stress Concern Present (05/03/2023)   [employer] of Occupational Health - Occupational Stress Questionnaire    Feeling of Stress : Rather much  Social Connections: Unknown (05/03/2023)   Social Connection and Isolation Panel [NHANES]    Frequency of Communication with Friends and Family: More than three times a week    Frequency of Social Gatherings with Friends and Family: Once a week    Attends Religious Services: Patient declined    Database administrator or Organizations: No    Attends Engineer, structural: Not on file    Marital Status: Living with partner  Intimate Partner Violence: Not on file      PHYSICAL EXAM  There were no vitals  filed for this visit. There is no height or weight on file to calculate BMI.  Generalized: Well developed, in no acute distress  Chest: Lungs clear to auscultation bilaterally  Neurological examination  Mentation: Alert oriented to time, place, history taking. Follows all commands speech and language fluent Cranial nerve II-XII: Extraocular movements were full, visual field were full on confrontational test Head turning and shoulder shrug  were normal and symmetric. Motor: The motor testing reveals 5 over 5 strength of all 4 extremities. Good symmetric motor tone is noted throughout.  Sensory: Sensory testing is intact to soft touch on all 4 extremities. No evidence of extinction is noted.  Gait and station: Gait is normal.    DIAGNOSTIC DATA (LABS, IMAGING, TESTING) -  I reviewed patient records, labs, notes, testing and imaging myself where available.  Lab Results  Component Value Date   WBC 10.1 05/05/2023   HGB 11.5 05/05/2023   HCT 34.3 05/05/2023   MCV 95 05/05/2023   PLT 281 05/05/2023      Component Value Date/Time   NA 139 05/05/2023 0914   K 4.1 05/05/2023 0914   CL 104 05/05/2023 0914   CO2 18 (L) 05/05/2023 0914   GLUCOSE 120 (H) 05/05/2023 0914   GLUCOSE 88 04/16/2021 1026   BUN 7 05/05/2023 0914   CREATININE 0.94 05/05/2023 0914   CALCIUM 9.5 05/05/2023 0914   PROT 6.2 05/05/2023 0914   ALBUMIN 4.3 05/05/2023 0914   AST 27 05/05/2023 0914   ALT 42 (H) 05/05/2023 0914   ALKPHOS 71 05/05/2023 0914   BILITOT 0.6 05/05/2023 0914   GFRNONAA >60 03/27/2020 2340   GFRAA >60 08/06/2019 0759   Lab Results  Component Value Date   CHOL 123 05/05/2023   HDL 57 05/05/2023   LDLCALC 49 05/05/2023   TRIG 88 05/05/2023   CHOLHDL 2.2 05/05/2023   Lab Results  Component Value Date   HGBA1C 5.4 05/05/2023   No results found for: "VITAMINB12" Lab Results  Component Value Date   TSH 0.348 (L) 05/05/2023      ASSESSMENT AND PLAN 38 y.o. year old female  has a  past medical history of Borderline personality disorder (HCC), Headache, Hyperlipidemia (09/25/2021), Hypertension in pregnancy, Knee pain, Nephrolithiasis, Postpartum depression, Preeclampsia, and Sleep apnea. here with:  OSA on CPAP  - CPAP compliance excellent - Good treatment of AHI  - Encourage patient to use CPAP nightly and > 4 hours each night - F/U in 1 year or sooner if needed   I spent *** minutes of face-to-face and non-face-to-face time with patient.  This included previsit chart review, lab review, study review, order entry, electronic health record documentation, patient education.  Butch Penny, MSN, NP-C 07/02/2023, 8:24 PM [facility] [address], [address] [place], [place] [address] [phone]

## 2023-07-03 ENCOUNTER — Telehealth (INDEPENDENT_AMBULATORY_CARE_PROVIDER_SITE_OTHER): Admitting: Adult Health

## 2023-07-03 ENCOUNTER — Telehealth

## 2023-07-03 DIAGNOSIS — G4733 Obstructive sleep apnea (adult) (pediatric): Secondary | ICD-10-CM | POA: Diagnosis not present

## 2023-07-03 DIAGNOSIS — J069 Acute upper respiratory infection, unspecified: Secondary | ICD-10-CM | POA: Diagnosis not present

## 2023-07-03 NOTE — Patient Instructions (Signed)
 Continue using CPAP nightly and greater than 4 hours each night If your symptoms worsen or you develop new symptoms please let us know.

## 2023-07-03 NOTE — Progress Notes (Signed)
 PATIENT: [name] DOB: 12-Nov-1985  REASON FOR VISIT: follow up HISTORY FROM: patient PRIMARY NEUROLOGIST: Dr. Vickey Huger   Virtual Visit via Video Note  I connected with [name] on 07/03/23 at  8:15 AM EDT by a video enabled telemedicine application located remotely at [facility] and verified that I am speaking with the correct person using two identifiers who was located at their own home.   I discussed the limitations of evaluation and management by telemedicine and the availability of in person appointments. The patient expressed understanding and agreed to proceed.   PATIENT: [name] DOB: [birth year]  REASON FOR VISIT: follow up HISTORY FROM: patient  HISTORY OF PRESENT ILLNESS: Today 07/03/23:  [name] is a 38 y.o. female with a history of obstructive sleep apnea on CPAP. Returns today for follow-up.  She reports that she can tell a difference since she started using CPAP.  She states that she wakes up more refreshed.  Denies any trouble using the machine.  She states that it may have helped slightly with her migraines but not significantly.  Her download is below       REVIEW OF SYSTEMS: Out of a complete 14 system review of symptoms, the patient complains only of the following symptoms, and all other reviewed systems are negative.  ALLERGIES: Allergies  Allergen Reactions   Skin Bleaching [Hydroquinone] Anaphylaxis and Shortness Of Breath    Any bleach products; Facial swelling, SOB   Sodium Hypochlorite Shortness Of Breath    Any bleach products    Keflex [Cephalexin] Rash   Latex Rash    HOME MEDICATIONS: Outpatient Medications Prior to Visit  Medication Sig Dispense Refill   albuterol (VENTOLIN HFA) 108 (90 Base) MCG/ACT inhaler Inhale 1-2 puffs into the lungs every 6 (six) hours as needed for wheezing or shortness of breath. 18 g 0   candesartan (ATACAND) 8 MG tablet Take 1 tablet (8 mg total) by mouth daily. 60  tablet 2   clindamycin (CLEOCIN T) 1 % SWAB Apply topically 2 (two) times daily.     doxycycline (VIBRA-TABS) 100 MG tablet Take 100 mg by mouth 2 (two) times daily.     Fremanezumab-vfrm (AJOVY) 225 MG/1.5ML SOAJ Inject 225 mg into the skin every 30 (thirty) days. 1.5 mL 11   gabapentin (NEURONTIN) 100 MG capsule Take 2 capsules (200 mg total) by mouth 3 (three) times daily as needed (anxiety). 180 capsule 2   lamoTRIgine (LAMICTAL) 100 MG tablet Take 2 tablets (200 mg total) by mouth daily. 180 tablet 0   rosuvastatin (CRESTOR) 40 MG tablet Take 1 tablet (40 mg total) by mouth daily. 90 tablet 4   Semaglutide-Weight Management (WEGOVY) 1.7 MG/0.75ML SOAJ Inject 1.7 mg into the skin once a week. 3 mL 3   sodium chloride 0.9 % SOLN 100 mL with Eptinezumab-jjmr 100 MG/ML SOLN 100 mg Inject 100 mg into the vein every 3 (three) months. 100 mg 4   traZODone (DESYREL) 100 MG tablet Take 1 tablet (100 mg total) by mouth at bedtime. 90 tablet 0   Vitamin D, Ergocalciferol, (DRISDOL) 1.25 MG (50000 UNIT) CAPS capsule TAKE 1 CAPSULE (50,000 UNITS TOTAL) BY MOUTH EVERY 7 (SEVEN) DAYS 5 capsule 5   vortioxetine HBr (TRINTELLIX) 10 MG TABS tablet Take 1 tablet (10 mg total) by mouth daily. 30 tablet 2   No facility-administered medications prior to visit.    PAST MEDICAL HISTORY: Past Medical History:  Diagnosis Date  Borderline personality disorder (HCC)    Headache    Hyperlipidemia 09/25/2021   Hypertension in pregnancy    Knee pain    Nephrolithiasis    Postpartum depression    Preeclampsia    Sleep apnea     PAST SURGICAL HISTORY: Past Surgical History:  Procedure Laterality Date   APPENDECTOMY     CESAREAN SECTION     CYSTOSCOPY/URETEROSCOPY/HOLMIUM LASER/STENT PLACEMENT Right 04/16/2021   Procedure: CYSTOSCOPY RIGHT RETROGRADE PYELOGRAM  URETEROSCOPY/HOLMIUM LASER/STENT PLACEMENT;  Surgeon: Bjorn Pippin, MD;  Location: [facility];  Service: Urology;  Laterality: Right;    HOLMIUM LASER APPLICATION Right 04/16/2021   Procedure: HOLMIUM LASER APPLICATION;  Surgeon: Bjorn Pippin, MD;  Location: [facility];  Service: Urology;  Laterality: Right;   KIDNEY STONE SURGERY  04/16/2021   r-kidney   KNEE SURGERY Right    scoped x2   MEDIAL PATELLOFEMORAL LIGAMENT REPAIR Right 05/04/2023   Procedure: MEDIAL PATELLA FEMORAL LIGAMENT RECONSTRUCTION WITH OSTEOCHONDRAL AUTOGRAFT;  Surgeon: Bjorn Pippin, MD;  Location: [facility];  Service: Orthopedics;  Laterality: Right;   WISDOM TOOTH EXTRACTION  2010    FAMILY HISTORY: Family History  Problem Relation Age of Onset   Migraines Mother    Hypertension Mother    COPD Mother    Healthy Father    Migraines Maternal Grandmother    Stroke Maternal Grandmother    Diabetes Maternal Grandfather    Hypertension Maternal Grandfather     SOCIAL HISTORY: Social History   Socioeconomic History   Marital status: Significant Other    Spouse name: Not on file   Number of children: Not on file   Years of education: Not on file   Highest education level: Some college, no degree  Occupational History   Not on file  Tobacco Use   Smoking status: Never   Smokeless tobacco: Never  Vaping Use   Vaping status: Never Used  Substance and Sexual Activity   Alcohol use: No   Drug use: Not Currently   Sexual activity: Yes    Birth control/protection: None  Other Topics Concern   Not on file  Social History Narrative   Caffiene 300mg  daily   Working : delivery service    Social Drivers of Health   Financial Resource Strain: High Risk (05/03/2023)   Overall Financial Resource Strain (CARDIA)    Difficulty of Paying Living Expenses: Hard  Food Insecurity: Food Insecurity Present (05/03/2023)   Hunger Vital Sign    Worried About Running Out of Food in the Last Year: Sometimes true    Ran Out of Food in the Last Year: Sometimes true  Transportation Needs: No Transportation Needs (05/03/2023)    PRAPARE - Administrator, Civil Service (Medical): No    Lack of Transportation (Non-Medical): No  Physical Activity: Unknown (05/03/2023)   Exercise Vital Sign    Days of Exercise per Week: Patient declined    Minutes of Exercise per Session: 30 min  Stress: Stress Concern Present (05/03/2023)   [employer] of Occupational Health - Occupational Stress Questionnaire    Feeling of Stress : Rather much  Social Connections: Unknown (05/03/2023)   Social Connection and Isolation Panel [NHANES]    Frequency of Communication with Friends and Family: More than three times a week    Frequency of Social Gatherings with Friends and Family: Once a week    Attends Religious Services: Patient declined    Database administrator or Organizations:  No    Attends Banker Meetings: Not on file    Marital Status: Living with partner  Intimate Partner Violence: Not on file      PHYSICAL EXAM Generalized: Well developed, in no acute distress   Neurological examination  Mentation: Alert oriented to time, place, history taking. Follows all commands speech and language fluent Cranial nerve II-XII:Extraocular movements were full. Facial symmetry noted. uvula tongue midline. Head turning and shoulder shrug  were normal and symmetric. Motor: Good strength throughout subjectively per patient Sensory: Sensory testing is intact to soft touch on all 4 extremities subjectively per patient Coordination: Cerebellar testing reveals good finger-nose-finger  Gait and station: Patient is able to stand from a seated position. gait is normal.  Reflexes: UTA  DIAGNOSTIC DATA (LABS, IMAGING, TESTING) - I reviewed patient records, labs, notes, testing and imaging myself where available.  Lab Results  Component Value Date   WBC 10.1 05/05/2023   HGB 11.5 05/05/2023   HCT 34.3 05/05/2023   MCV 95 05/05/2023   PLT 281 05/05/2023      Component Value Date/Time   NA 139 05/05/2023 0914   K  4.1 05/05/2023 0914   CL 104 05/05/2023 0914   CO2 18 (L) 05/05/2023 0914   GLUCOSE 120 (H) 05/05/2023 0914   GLUCOSE 88 04/16/2021 1026   BUN 7 05/05/2023 0914   CREATININE 0.94 05/05/2023 0914   CALCIUM 9.5 05/05/2023 0914   PROT 6.2 05/05/2023 0914   ALBUMIN 4.3 05/05/2023 0914   AST 27 05/05/2023 0914   ALT 42 (H) 05/05/2023 0914   ALKPHOS 71 05/05/2023 0914   BILITOT 0.6 05/05/2023 0914   GFRNONAA >60 03/27/2020 2340   GFRAA >60 08/06/2019 0759   Lab Results  Component Value Date   CHOL 123 05/05/2023   HDL 57 05/05/2023   LDLCALC 49 05/05/2023   TRIG 88 05/05/2023   CHOLHDL 2.2 05/05/2023   Lab Results  Component Value Date   HGBA1C 5.4 05/05/2023   No results found for: "VITAMINB12" Lab Results  Component Value Date   TSH 0.348 (L) 05/05/2023      ASSESSMENT AND PLAN 38 y.o. year old female  has a past medical history of Borderline personality disorder (HCC), Headache, Hyperlipidemia (09/25/2021), Hypertension in pregnancy, Knee pain, Nephrolithiasis, Postpartum depression, Preeclampsia, and Sleep apnea. here with:  OSA on CPAP  CPAP compliance excellent Residual AHI is good Encouraged patient to continue using CPAP nightly and > 4 hours each night F/U in 1 year or sooner if needed  Butch Penny, MSN, NP-C 07/03/2023, 8:33 AM [facility] [address], [address] [place], [place] [address] [phone]

## 2023-07-04 ENCOUNTER — Telehealth: Payer: Self-pay | Admitting: *Deleted

## 2023-07-04 MED ORDER — BENZONATATE 100 MG PO CAPS: 100.0000 mg | ORAL_CAPSULE | Freq: Three times a day (TID) | ORAL | 0 refills | Status: DC | PRN

## 2023-07-04 NOTE — Progress Notes (Signed)
 I have spent 5 minutes in review of e-visit questionnaire, review and updating patient chart, medical decision making and response to patient.   Piedad Climes, PA-C

## 2023-07-04 NOTE — Telephone Encounter (Signed)
 Ajovy PA needed urgently. This was prescribed a few weeks ago. Thank you!!

## 2023-07-04 NOTE — Progress Notes (Signed)

## 2023-07-05 ENCOUNTER — Telehealth: Payer: Self-pay

## 2023-07-05 ENCOUNTER — Ambulatory Visit

## 2023-07-05 DIAGNOSIS — G8929 Other chronic pain: Secondary | ICD-10-CM | POA: Diagnosis not present

## 2023-07-05 DIAGNOSIS — M6281 Muscle weakness (generalized): Secondary | ICD-10-CM | POA: Diagnosis not present

## 2023-07-05 DIAGNOSIS — M25561 Pain in right knee: Secondary | ICD-10-CM | POA: Diagnosis not present

## 2023-07-05 DIAGNOSIS — M25661 Stiffness of right knee, not elsewhere classified: Secondary | ICD-10-CM | POA: Diagnosis not present

## 2023-07-05 NOTE — Telephone Encounter (Signed)
*  [facility]  Pharmacy Patient Advocate Encounter   Received notification from Pt Calls Messages that prior authorization for AJOVY (fremanezumab-vfrm) injection 225MG /1.5ML auto-injectors  is required/requested.   Insurance verification completed.   The patient is insured through [facility] .   Per test claim: PA required; PA submitted to above mentioned insurance via CoverMyMeds Key/confirmation #/EOC [id number] Status is pending

## 2023-07-05 NOTE — Telephone Encounter (Signed)
 PA request has been Submitted. New Encounter has been or will be created for follow up. For additional info see Pharmacy Prior Auth telephone encounter from 03/26.
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
## 2023-07-07 ENCOUNTER — Ambulatory Visit

## 2023-07-07 DIAGNOSIS — M6281 Muscle weakness (generalized): Secondary | ICD-10-CM | POA: Diagnosis not present

## 2023-07-07 DIAGNOSIS — M25661 Stiffness of right knee, not elsewhere classified: Secondary | ICD-10-CM

## 2023-07-07 DIAGNOSIS — G8929 Other chronic pain: Secondary | ICD-10-CM | POA: Diagnosis not present

## 2023-07-07 DIAGNOSIS — M25561 Pain in right knee: Secondary | ICD-10-CM | POA: Diagnosis not present
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
## 2023-07-07 NOTE — Telephone Encounter (Signed)
 Approved. AJOVY 225MG /1.5ML Soln Auto-[employer] is approved from 07/05/2023 to 05/16/2024.

## 2023-07-10 ENCOUNTER — Encounter: Admitting: *Deleted

## 2023-07-10 NOTE — Telephone Encounter (Signed)
 Awesome thanks! I let the patient know.

## 2023-07-11 DIAGNOSIS — L732 Hidradenitis suppurativa: Secondary | ICD-10-CM | POA: Diagnosis not present

## 2023-07-13 ENCOUNTER — Ambulatory Visit: Attending: Orthopaedic Surgery | Admitting: *Deleted

## 2023-07-13 DIAGNOSIS — M25561 Pain in right knee: Secondary | ICD-10-CM | POA: Diagnosis not present

## 2023-07-13 DIAGNOSIS — M6281 Muscle weakness (generalized): Secondary | ICD-10-CM | POA: Diagnosis not present

## 2023-07-13 DIAGNOSIS — M25661 Stiffness of right knee, not elsewhere classified: Secondary | ICD-10-CM | POA: Insufficient documentation

## 2023-07-13 DIAGNOSIS — G8929 Other chronic pain: Secondary | ICD-10-CM | POA: Diagnosis not present

## 2023-07-13 DIAGNOSIS — G4733 Obstructive sleep apnea (adult) (pediatric): Secondary | ICD-10-CM | POA: Diagnosis not present
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
## 2023-07-14 ENCOUNTER — Telehealth: Admitting: Physician Assistant

## 2023-07-14 DIAGNOSIS — J019 Acute sinusitis, unspecified: Secondary | ICD-10-CM | POA: Diagnosis not present

## 2023-07-14 DIAGNOSIS — B9689 Other specified bacterial agents as the cause of diseases classified elsewhere: Secondary | ICD-10-CM

## 2023-07-14 MED ORDER — DOXYCYCLINE HYCLATE 100 MG PO TABS: 100.0000 mg | ORAL_TABLET | Freq: Two times a day (BID) | ORAL | 0 refills | Status: DC

## 2023-07-14 NOTE — Progress Notes (Signed)

## 2023-07-20 ENCOUNTER — Ambulatory Visit

## 2023-07-20 DIAGNOSIS — M25661 Stiffness of right knee, not elsewhere classified: Secondary | ICD-10-CM | POA: Diagnosis not present

## 2023-07-20 DIAGNOSIS — M25561 Pain in right knee: Secondary | ICD-10-CM | POA: Diagnosis not present

## 2023-07-20 DIAGNOSIS — G8929 Other chronic pain: Secondary | ICD-10-CM | POA: Diagnosis not present

## 2023-07-20 DIAGNOSIS — M6281 Muscle weakness (generalized): Secondary | ICD-10-CM | POA: Diagnosis not present
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
## 2023-07-27 ENCOUNTER — Ambulatory Visit

## 2023-07-27 DIAGNOSIS — M25661 Stiffness of right knee, not elsewhere classified: Secondary | ICD-10-CM | POA: Diagnosis not present

## 2023-07-27 DIAGNOSIS — M25561 Pain in right knee: Secondary | ICD-10-CM

## 2023-07-27 DIAGNOSIS — M6281 Muscle weakness (generalized): Secondary | ICD-10-CM

## 2023-07-27 DIAGNOSIS — G8929 Other chronic pain: Secondary | ICD-10-CM | POA: Diagnosis not present
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
## 2023-08-04 ENCOUNTER — Ambulatory Visit

## 2023-08-04 NOTE — Therapy (Signed)
 Patient presented reporting that her knee felt good. She was able to meet all of her goals and she felt comfortable being placed on hold at this time.   Glendora Landsman, PT, DPT

## 2023-08-09 ENCOUNTER — Encounter: Payer: Self-pay | Admitting: Neurology

## 2023-08-12 DIAGNOSIS — G4733 Obstructive sleep apnea (adult) (pediatric): Secondary | ICD-10-CM | POA: Diagnosis not present
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
## 2023-08-24 ENCOUNTER — Telehealth (HOSPITAL_COMMUNITY): Payer: Medicaid Other | Admitting: Psychiatry

## 2023-08-26 ENCOUNTER — Encounter: Payer: Self-pay | Admitting: Neurology

## 2023-08-28 ENCOUNTER — Telehealth: Payer: Self-pay | Admitting: *Deleted

## 2023-08-28 NOTE — Telephone Encounter (Signed)
 Pt states the pharmacy needs another Wegovy  PA. Use Dr Harding Li last note 05/24/23 for support. Thanks!

## 2023-09-05 ENCOUNTER — Telehealth: Payer: Self-pay

## 2023-09-05 ENCOUNTER — Other Ambulatory Visit (HOSPITAL_COMMUNITY): Payer: Self-pay

## 2023-09-05 NOTE — Telephone Encounter (Signed)
 Pharmacy Patient Advocate Encounter   Received notification from Physician's Office that prior authorization for Wegovy  1.7MG /0.75ML auto-injectors is required/requested.   Insurance verification completed.   The patient is insured through [facility] .   Per test claim: PA required; PA submitted to above mentioned insurance via CoverMyMeds Key/confirmation #/EOC [id number] Status is pending

## 2023-09-06 ENCOUNTER — Encounter: Payer: Self-pay | Admitting: Neurology

## 2023-09-06 ENCOUNTER — Other Ambulatory Visit (HOSPITAL_COMMUNITY): Payer: Self-pay

## 2023-09-06 DIAGNOSIS — G43801 Other migraine, not intractable, with status migrainosus: Secondary | ICD-10-CM | POA: Diagnosis not present

## 2023-09-06 NOTE — Telephone Encounter (Signed)
 Pharmacy Patient Advocate Encounter  Received notification from [facility] that Prior Authorization for Wegovy  1.7MG /0.75ML auto-injectors has been APPROVED from 09/05/2023 to 10/06/2023   PA #/Case ID/Reference #: N/A Approval letter in chart under the media tab

## 2023-09-11 ENCOUNTER — Other Ambulatory Visit: Payer: Self-pay | Admitting: Neurology

## 2023-09-11 MED ORDER — RIZATRIPTAN BENZOATE 10 MG PO TBDP: 10.0000 mg | ORAL_TABLET | ORAL | 11 refills | Status: AC | PRN

## 2023-09-12 ENCOUNTER — Other Ambulatory Visit: Payer: Self-pay | Admitting: Family Medicine

## 2023-09-12 ENCOUNTER — Other Ambulatory Visit: Payer: Self-pay | Admitting: Neurology

## 2023-09-12 DIAGNOSIS — G4733 Obstructive sleep apnea (adult) (pediatric): Secondary | ICD-10-CM | POA: Diagnosis not present

## 2023-09-12 DIAGNOSIS — E559 Vitamin D deficiency, unspecified: Secondary | ICD-10-CM

## 2023-09-13 ENCOUNTER — Telehealth: Admitting: Physician Assistant

## 2023-09-13 DIAGNOSIS — J069 Acute upper respiratory infection, unspecified: Secondary | ICD-10-CM

## 2023-09-13 MED ORDER — FLUTICASONE PROPIONATE 50 MCG/ACT NA SUSP: 2.0000 | Freq: Every day | NASAL | 0 refills | Status: DC

## 2023-09-13 MED ORDER — PSEUDOEPH-BROMPHEN-DM 30-2-10 MG/5ML PO SYRP: 5.0000 mL | ORAL_SOLUTION | Freq: Four times a day (QID) | ORAL | 0 refills | Status: DC | PRN

## 2023-09-13 NOTE — Progress Notes (Signed)

## 2023-09-14 ENCOUNTER — Encounter: Payer: Self-pay | Admitting: Family Medicine

## 2023-09-14 ENCOUNTER — Other Ambulatory Visit: Payer: Self-pay | Admitting: Neurology

## 2023-09-14 DIAGNOSIS — E559 Vitamin D deficiency, unspecified: Secondary | ICD-10-CM

## 2023-09-15 MED ORDER — VITAMIN D (ERGOCALCIFEROL) 1.25 MG (50000 UNIT) PO CAPS: 50000.0000 [IU] | ORAL_CAPSULE | ORAL | 5 refills | Status: AC
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
## 2023-09-26 ENCOUNTER — Encounter: Payer: Self-pay | Admitting: Neurology

## 2023-09-26 ENCOUNTER — Telehealth: Payer: Medicaid Other | Admitting: Neurology

## 2023-09-26 DIAGNOSIS — G441 Vascular headache, not elsewhere classified: Secondary | ICD-10-CM

## 2023-09-26 DIAGNOSIS — I10 Essential (primary) hypertension: Secondary | ICD-10-CM | POA: Diagnosis not present

## 2023-09-26 DIAGNOSIS — H539 Unspecified visual disturbance: Secondary | ICD-10-CM

## 2023-09-26 DIAGNOSIS — H9313 Tinnitus, bilateral: Secondary | ICD-10-CM

## 2023-09-26 DIAGNOSIS — E782 Mixed hyperlipidemia: Secondary | ICD-10-CM | POA: Diagnosis not present

## 2023-09-26 DIAGNOSIS — G43719 Chronic migraine without aura, intractable, without status migrainosus: Secondary | ICD-10-CM | POA: Diagnosis not present

## 2023-09-26 DIAGNOSIS — R51 Headache with orthostatic component, not elsewhere classified: Secondary | ICD-10-CM

## 2023-09-26 DIAGNOSIS — G8929 Other chronic pain: Secondary | ICD-10-CM | POA: Diagnosis not present

## 2023-09-26 DIAGNOSIS — G43709 Chronic migraine without aura, not intractable, without status migrainosus: Secondary | ICD-10-CM

## 2023-09-26 MED ORDER — WEGOVY 2.4 MG/0.75ML ~~LOC~~ SOAJ: 2.4000 mg | SUBCUTANEOUS | 4 refills | Status: AC
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
## 2023-09-26 NOTE — Patient Instructions (Addendum)
 MRI of the brain w/wo contrast Lumbar puncture for pressure  Idiopathic Intracranial Hypertension? Idiopathic intracranial hypertension (IIH) is increased pressure in your skull. It occurs because of cerebrospinal fluid buildup around your brain. The cause is unknown. It can affect your vision and cause headaches, nausea or ringing in your ears. Treatments are available.  Contents Overview Symptoms and Causes Diagnosis and Tests Management and Treatment Outlook / Prognosis Prevention Living With Contents Overview Symptoms and Causes Diagnosis and Tests Management and Treatment Outlook / Prognosis Prevention Living With Overview What is idiopathic intracranial hypertension? Idiopathic intracranial hypertension (IIH), formerly known as pseudotumor cerebri, is increased pressure around your brain. It occurs when cerebrospinal fluid (CSF), the liquid that cushions your spinal cord and brain, builds up in your skull. Pressure builds up in your brain and on your optic nerve, the nerve at the back of your eye that helps you see.  The words within the condition name mean:  Idiopathic: No known cause Intracranial: In the skull Hypertension: High pressure A former name for IIH is pseudotumor cerebri. Pseudotumor cerebri means "false brain tumor." Providers used to call it this because the symptoms can be similar to a brain tumor.  Let a healthcare provider know if you notice symptoms like changes to your vision in addition to a headache or ringing in your ears. Treatment options are available for IIH.  How common is idiopathic intracranial hypertension? IIH is rare. It affects an estimated .2 to 2 people (between ages 45 and 8) out of 100,000 in a general population study.  Is IIH a serious condition? IIH isn't life-threatening. However, symptoms can cause permanent vision changes that can affect your quality of life. A healthcare provider can quickly treat this condition to prevent  complications.  Symptoms and Causes Common symptoms of Idiopathic intracranial hypertension include tinnitus, double vision and temporary blindness Symptoms of idiopathic intracranial hypertension happen when there's increased pressure around your brain. What are the symptoms of idiopathic intracranial hypertension? Symptoms of IIH include:  Double vision Fatigue Headaches (sudden and severe) Loss of peripheral (side) vision Nausea and vomiting Shoulder and neck pain Temporary blindness or blind spots in vision Tinnitus (ringing in your ears) Several conditions can cause these symptoms, so check with a provider to find out if these symptoms are related to IIH or something else.  What causes idiopathic intracranial hypertension? Healthcare providers don't know the exact cause of IIH. "Idiopathic" means that the cause is unknown.  Researchers theorize that there may be a blockage in the path cerebrospinal fluid uses to travel through your brain (CSF pathway) or a narrowing of large veins (venous sinuses) in your brain. This may cause fluid or blood to back up as it tries to exit your brain, which increases pressure.  Is idiopathic intracranial hypertension hereditary? Studies are ongoing to learn more about whether IIH is hereditary (runs in your biological family). Some people who have the condition report family members having it, too.  What are the risk factors for idiopathic intracranial hypertension? IIH can affect anyone. You may be more at risk of developing it if you:  Have a body mass index (BMI) above 30 Are female Are between ages 19 and 86 What are the complications of idiopathic intracranial hypertension? IIH may lead to vision loss if untreated. This is irreversible. A healthcare provider can offer treatment options to prevent this complication, so let them know as soon as possible if you notice changes to your vision.  Diagnosis and Tests How is  idiopathic  intracranial hypertension diagnosed? To diagnose IIH, a healthcare provider will take your medical history and perform a physical exam. They'll learn more about what symptoms you experience and order several diagnostic tests to rule out conditions with similar symptoms. Tests may include:  Eye exam with a visual field test to check for blind spots in your vision Brain CT scan or MRI Spinal tap (lumbar puncture) Management and Treatment How is idiopathic intracranial hypertension treated? The goals of IIH treatment are to decrease pressure on your brain and prevent vision loss. Depending on the severity, your healthcare provider may recommend:  Taking medications Undergoing surgery Weight management Your provider will determine what type of treatment is best by reviewing diagnostic test results. They're looking for areas of your brain where there may be problems with cerebrospinal fluid movement or blood flow. They'll also assess the severity of your condition. Severe cases may require surgery to prevent permanent vision loss.  What medications treat idiopathic intracranial hypertension? Some medicines can help you manage IIH symptoms. Your provider may prescribe:  Acetazolamide or topiramate to help your body produce less CSF A diuretic (water pill) to decrease fluid retention Pain relievers for headaches Let your healthcare provider know if you're pregnant, plan on becoming pregnant or take birth control pills. They may offer alternatives for acetazolamide and topiramate due to potential side effects.  IIH surgery In severe cases, you may need surgery for IIH. Surgical options could include:  Shunt: A shunt is a long, thin tube placed in your brain to drain excess CSF. Stent: A stent is a small, mesh tube placed in your venous sinus (a large vein in your brain). The stent makes the venous sinus wider. Optic nerve sheath fenestration: This is an eye surgery where your provider makes small  incisions around your optic nerve to allow better CSF drainage. Weight management for IIH To prevent IIH from happening again, your provider may suggest making lifestyle changes to improve your overall health. This could include participating in a weight management program if you have a BMI over 30.  Researchers suggest that excess body fat in your chest and abdomen creates pressure that makes it harder for blood from your brain to reach that area. As a result, fluid can collect in your brain and increase your risk of IIH.

## 2023-09-27 ENCOUNTER — Emergency Department (HOSPITAL_COMMUNITY)

## 2023-09-27 ENCOUNTER — Telehealth: Payer: Self-pay | Admitting: Neurology

## 2023-09-27 ENCOUNTER — Emergency Department (HOSPITAL_COMMUNITY)
Admission: EM | Admit: 2023-09-27 | Discharge: 2023-09-27 | Disposition: A | Discharge status: Home / Self Care | Attending: Emergency Medicine | Admitting: Emergency Medicine

## 2023-09-27 ENCOUNTER — Encounter (HOSPITAL_COMMUNITY): Payer: Self-pay | Admitting: *Deleted

## 2023-09-27 ENCOUNTER — Other Ambulatory Visit: Payer: Self-pay

## 2023-09-27 DIAGNOSIS — Z91040 Latex allergy status: Secondary | ICD-10-CM | POA: Diagnosis not present

## 2023-09-27 DIAGNOSIS — R109 Unspecified abdominal pain: Secondary | ICD-10-CM | POA: Insufficient documentation

## 2023-09-27 DIAGNOSIS — N200 Calculus of kidney: Secondary | ICD-10-CM | POA: Diagnosis not present

## 2023-09-27 DIAGNOSIS — M549 Dorsalgia, unspecified: Secondary | ICD-10-CM | POA: Diagnosis not present

## 2023-09-27 DIAGNOSIS — R319 Hematuria, unspecified: Secondary | ICD-10-CM | POA: Insufficient documentation

## 2023-09-27 DIAGNOSIS — R300 Dysuria: Secondary | ICD-10-CM | POA: Diagnosis not present

## 2023-09-27 LAB — COMPREHENSIVE METABOLIC PANEL WITH GFR
ALT: 57 U/L — ABNORMAL HIGH (ref 0–44)
AST: 42 U/L — ABNORMAL HIGH (ref 15–41)
Albumin: 3.9 g/dL (ref 3.5–5.0)
Alkaline Phosphatase: 76 U/L (ref 38–126)
Anion gap: 9 (ref 5–15)
BUN: 7 mg/dL (ref 6–20)
CO2: 22 mmol/L (ref 22–32)
Calcium: 9.4 mg/dL (ref 8.9–10.3)
Chloride: 107 mmol/L (ref 98–111)
Creatinine, Ser: 0.95 mg/dL (ref 0.44–1.00)
GFR, Estimated: >60 mL/min (ref >60)
Glucose, Bld: 93 mg/dL (ref 70–99)
Potassium: 3.9 mmol/L (ref 3.5–5.1)
Sodium: 138 mmol/L (ref 135–145)
Total Bilirubin: 0.7 mg/dL (ref 0.0–1.2)
Total Protein: 6.3 g/dL — ABNORMAL LOW (ref 6.5–8.1)

## 2023-09-27 LAB — URINALYSIS, ROUTINE W REFLEX MICROSCOPIC
Bilirubin Urine: NEGATIVE
Glucose, UA: NEGATIVE mg/dL
Ketones, ur: NEGATIVE mg/dL
Nitrite: NEGATIVE
Protein, ur: 30 mg/dL — AB
Specific Gravity, Urine: 1.015 (ref 1.005–1.030)
pH: 5 (ref 5.0–8.0)

## 2023-09-27 LAB — CBC WITH DIFFERENTIAL/PLATELET
Abs Immature Granulocytes: 0.01 10*3/uL (ref 0.00–0.07)
Basophils Absolute: 0 10*3/uL (ref 0.0–0.1)
Basophils Relative: 1 %
Eosinophils Absolute: 0.1 10*3/uL (ref 0.0–0.5)
Eosinophils Relative: 1 %
HCT: 36.3 % (ref 36.0–46.0)
Hemoglobin: 12.1 g/dL (ref 12.0–15.0)
Immature Granulocytes: 0 %
Lymphocytes Relative: 46 %
Lymphs Abs: 2.4 10*3/uL (ref 0.7–4.0)
MCH: 31.4 pg (ref 26.0–34.0)
MCHC: 33.3 g/dL (ref 30.0–36.0)
MCV: 94.3 fL (ref 80.0–100.0)
Monocytes Absolute: 0.4 10*3/uL (ref 0.1–1.0)
Monocytes Relative: 7 %
Neutro Abs: 2.3 10*3/uL (ref 1.7–7.7)
Neutrophils Relative %: 45 %
Platelets: 237 10*3/uL (ref 150–400)
RBC: 3.85 MIL/uL — ABNORMAL LOW (ref 3.87–5.11)
RDW: 12.4 % (ref 11.5–15.5)
WBC: 5.2 10*3/uL (ref 4.0–10.5)
nRBC: 0 % (ref 0.0–0.2)

## 2023-09-27 LAB — PREGNANCY, URINE: Preg Test, Ur: NEGATIVE

## 2023-09-27 LAB — LIPASE, BLOOD: Lipase: 46 U/L (ref 11–51)

## 2023-09-27 MED ORDER — HYDROCODONE-ACETAMINOPHEN 5-325 MG PO TABS: 1.0000 | ORAL_TABLET | Freq: Four times a day (QID) | ORAL | 0 refills | Status: DC | PRN

## 2023-09-27 MED ORDER — KETOROLAC TROMETHAMINE 30 MG/ML IJ SOLN
30.0000 mg | Freq: Once | INTRAMUSCULAR | Status: AC
  Administered 2023-09-27: 30 mg via INTRAVENOUS
  Filled 2023-09-27: qty 1

## 2023-09-27 MED ORDER — ONDANSETRON 4 MG PO TBDP: ORAL_TABLET | ORAL | 0 refills | Status: DC

## 2023-09-27 MED ORDER — SULFAMETHOXAZOLE-TRIMETHOPRIM 800-160 MG PO TABS: 1.0000 | ORAL_TABLET | Freq: Two times a day (BID) | ORAL | 0 refills | Status: AC

## 2023-09-27 MED ORDER — ONDANSETRON HCL 4 MG/2ML IJ SOLN
4.0000 mg | Freq: Once | INTRAMUSCULAR | Status: AC
  Administered 2023-09-27: 4 mg via INTRAVENOUS
  Filled 2023-09-27: qty 2

## 2023-09-27 NOTE — Discharge Instructions (Signed)
Follow-up with your urologist next week °

## 2023-09-27 NOTE — Telephone Encounter (Signed)
 [facility] Dress: [id number] exp. 09/26/23-10/26/23 sent to [facility] [phone]

## 2023-09-27 NOTE — ED Notes (Signed)
Patient verbalizes understanding of discharge instructions. Opportunity for questioning and answers were provided. Armband removed by staff, pt discharged from ED. Ambulated out to lobby with husband

## 2023-09-27 NOTE — ED Triage Notes (Signed)
 Pt arrived via POV c/o right flank pain. Pt reports passing a kidney Stone last week and feels as though she may have a  new one. Pt reports seeing hematuria, and reports Urgent Care sent her to the ER for further evaluation.

## 2023-09-27 NOTE — ED Provider Notes (Signed)
 [place] EMERGENCY DEPARTMENT AT [facility] Provider Note   CSN: [id number] Arrival date & time: 09/27/23  1806     Patient presents with: Flank Pain   [name] is a 38 y.o. female.   Patient complains of flank pain.  Patient has a history of kidney stones.  The history is provided by the patient and medical records.  Flank Pain This is a new problem. The current episode started 6 to 12 hours ago. The problem occurs constantly. The problem has not changed since onset.Pertinent negatives include no chest pain, no abdominal pain and no headaches. Nothing aggravates the symptoms. Nothing relieves the symptoms. She has tried nothing for the symptoms.       Prior to Admission medications   Medication Sig Start Date End Date Taking? Authorizing Provider  HYDROcodone -acetaminophen  (NORCO/VICODIN) 5-325 MG tablet Take 1 tablet by mouth every 6 (six) hours as needed. 09/27/23  Yes Eddy Termine, MD  ondansetron  (ZOFRAN -ODT) 4 MG disintegrating tablet 4mg  ODT q4 hours prn nausea/vomit 09/27/23  Yes Dennis Hegeman, MD  sulfamethoxazole -trimethoprim  (BACTRIM  DS) 800-160 MG tablet Take 1 tablet by mouth 2 (two) times daily for 7 days. 09/27/23 10/04/23 Yes Suzette Pac, MD  brompheniramine-pseudoephedrine-DM 30-2-10 MG/5ML syrup Take 5 mLs by mouth 4 (four) times daily as needed. 09/13/23   Vivienne Delon HERO, PA-C  candesartan  (ATACAND ) 8 MG tablet Take 1 tablet (8 mg total) by mouth daily. 06/08/23   Ines Onetha NOVAK, MD  clindamycin (CLEOCIN T) 1 % SWAB Apply topically 2 (two) times daily.    [provider]  doxycycline  (VIBRAMYCIN ) 100 MG capsule Take 100 mg by mouth daily. 09/02/23   [provider]  EMGALITY  120 MG/ML SOAJ Inject 120 mg into the skin every 30 (thirty) days. 06/22/23   [provider]  fluticasone  (FLONASE ) 50 MCG/ACT nasal spray Place 2 sprays into both nostrils daily. 09/13/23   Vivienne Delon HERO, PA-C  Fremanezumab -vfrm (AJOVY )  225 MG/1.5ML SOAJ Inject 225 mg into the skin every 30 (thirty) days. 06/09/23   Ines Onetha NOVAK, MD  gabapentin  (NEURONTIN ) 100 MG capsule Take 2 capsules (200 mg total) by mouth 3 (three) times daily as needed (anxiety). 06/01/23   Carvin Arvella HERO, MD  promethazine  (PHENERGAN ) 25 MG tablet Take 25 mg by mouth every 6 (six) hours as needed. 09/06/23   [provider]  rizatriptan  (MAXALT -MLT) 10 MG disintegrating tablet Take 1 tablet (10 mg total) by mouth as needed for migraine. May repeat in 2 hours if needed 09/11/23   Ines Onetha NOVAK, MD  rosuvastatin  (CRESTOR ) 40 MG tablet Take 1 tablet (40 mg total) by mouth daily. 01/20/23   Ines Onetha NOVAK, MD  Semaglutide -Weight Management (WEGOVY ) 2.4 MG/0.75ML SOAJ Inject 2.4 mg into the skin once a week. 09/26/23   Ines Onetha NOVAK, MD  traZODone  (DESYREL ) 100 MG tablet Take 1 tablet (100 mg total) by mouth at bedtime. 06/01/23   Carvin Arvella HERO, MD  Vitamin D , Ergocalciferol , (DRISDOL ) 1.25 MG (50000 UNIT) CAPS capsule Take 1 capsule (50,000 Units total) by mouth every 7 (seven) days. 09/15/23   Severa Rock HERO, FNP  vortioxetine  HBr (TRINTELLIX ) 10 MG TABS tablet Take 1 tablet (10 mg total) by mouth daily. 06/01/23   Carvin Arvella HERO, MD    Allergies: Hydroquinone, Sodium hypochlorite, Keflex [cephalexin], and Latex    Review of Systems  Constitutional:  Negative for appetite change and fatigue.  HENT:  Negative for congestion, ear discharge and sinus pressure.  Eyes:  Negative for discharge.  Respiratory:  Negative for cough.   Cardiovascular:  Negative for chest pain.  Gastrointestinal:  Negative for abdominal pain and diarrhea.  Genitourinary:  Positive for flank pain. Negative for frequency and hematuria.  Musculoskeletal:  Negative for back pain.  Skin:  Negative for rash.  Neurological:  Negative for seizures and headaches.  Psychiatric/Behavioral:  Negative for hallucinations.     Updated Vital Signs BP 139/85   Pulse 81   Temp  98.3 F (36.8 C) (Oral)   Resp 18   Ht 5' 6 (1.676 m)   Wt 110 kg   LMP 08/31/2023 (Exact Date)   SpO2 100%   BMI 39.14 kg/m   Physical Exam Vitals and nursing note reviewed.  Constitutional:      Appearance: She is well-developed.  HENT:     Head: Normocephalic.     Nose: Nose normal.   Eyes:     General: No scleral icterus.    Conjunctiva/sclera: Conjunctivae normal.   Neck:     Thyroid : No thyromegaly.   Cardiovascular:     Rate and Rhythm: Normal rate and regular rhythm.     Heart sounds: No murmur heard.    No friction rub. No gallop.  Pulmonary:     Breath sounds: No stridor. No wheezing or rales.  Chest:     Chest wall: No tenderness.  Abdominal:     General: There is no distension.     Tenderness: There is no abdominal tenderness. There is no rebound.  Genitourinary:    Comments: Flank tender on right  Musculoskeletal:        General: Normal range of motion.     Cervical back: Neck supple.  Lymphadenopathy:     Cervical: No cervical adenopathy.   Skin:    Findings: No erythema or rash.   Neurological:     Mental Status: She is alert and oriented to person, place, and time.     Motor: No abnormal muscle tone.     Coordination: Coordination normal.   Psychiatric:        Behavior: Behavior normal.     (all labs ordered are listed, but only abnormal results are displayed) Labs Reviewed  URINALYSIS, ROUTINE W REFLEX MICROSCOPIC - Abnormal; Notable for the following components:      Result Value   APPearance HAZY (*)    Hgb urine dipstick MODERATE (*)    Protein, ur 30 (*)    Leukocytes,Ua MODERATE (*)    Bacteria, UA FEW (*)    All other components within normal limits  COMPREHENSIVE METABOLIC PANEL WITH GFR - Abnormal; Notable for the following components:   Total Protein 6.3 (*)    AST 42 (*)    ALT 57 (*)    All other components within normal limits  CBC WITH DIFFERENTIAL/PLATELET - Abnormal; Notable for the following components:   RBC  3.85 (*)    All other components within normal limits  URINE CULTURE  LIPASE, BLOOD  PREGNANCY, URINE    EKG: None  Radiology: CT Renal Stone Study Result Date: 09/27/2023 CLINICAL DATA:  Right flank pain, history of nephrolithiasis EXAM: CT ABDOMEN AND PELVIS WITHOUT CONTRAST TECHNIQUE: Multidetector CT imaging of the abdomen and pelvis was performed following the standard protocol without IV contrast. RADIATION DOSE REDUCTION: This exam was performed according to the departmental dose-optimization program which includes automated exposure control, adjustment of the mA and/or kV according to patient size and/or use of iterative reconstruction technique.  COMPARISON:  12/29/2004 FINDINGS: Lower chest: No acute pleural or parenchymal lung disease. Hepatobiliary: Unremarkable unenhanced appearance of the liver and gallbladder. Pancreas: Unremarkable unenhanced appearance. Spleen: Unremarkable unenhanced appearance. Adrenals/Urinary Tract: There are punctate less than 2 mm nonobstructing bilateral renal calculi. No obstructive uropathy within either kidney. The adrenals are normal. The bladder is decompressed, limiting its evaluation. Stomach/Bowel: No bowel obstruction or ileus. Prior appendectomy. No bowel wall thickening or inflammatory change. Vascular/Lymphatic: No significant vascular findings are present. No enlarged abdominal or pelvic lymph nodes. Reproductive: Uterus and bilateral adnexa are unremarkable. No free fluid or free intraperitoneal Other: Gas.  No abdominal wall hernia. Musculoskeletal: No acute or destructive bony abnormalities. Reconstructed images demonstrate no additional findings. IMPRESSION: 1. Punctate less than 2 mm nonobstructing bilateral renal calculi. 2. Otherwise unremarkable unenhanced exam. Electronically Signed   By: Ozell Daring M.D.   On: 09/27/2023 19:37     Procedures   Medications Ordered in the ED  ketorolac  (TORADOL ) 30 MG/ML injection 30 mg (30 mg  Intravenous Given 09/27/23 1858)  ondansetron  (ZOFRAN ) injection 4 mg (4 mg Intravenous Given 09/27/23 1856)     CT scan showed stones in the kidneys but not in the ureter.  Patient has blood in her urine.  She will have the urine cultured and will start on antibiotics and for referred to urology                               Medical Decision Making Amount and/or Complexity of Data Reviewed Labs: ordered. Radiology: ordered.  Risk Prescription drug management.   Patient with hematuria possibly passed kidney stone.  She is on antibiotics and pain medicines and will follow-up with urology     Final diagnoses:  Flank pain    ED Discharge Orders          Ordered    ondansetron  (ZOFRAN -ODT) 4 MG disintegrating tablet        09/27/23 2040    HYDROcodone -acetaminophen  (NORCO/VICODIN) 5-325 MG tablet  Every 6 hours PRN        09/27/23 2040    sulfamethoxazole -trimethoprim  (BACTRIM  DS) 800-160 MG tablet  2 times daily        09/27/23 2040               Suzette Pac, MD 09/29/23 1102

## 2023-09-28 ENCOUNTER — Telehealth (HOSPITAL_BASED_OUTPATIENT_CLINIC_OR_DEPARTMENT_OTHER): Admitting: Psychiatry

## 2023-09-28 ENCOUNTER — Encounter (HOSPITAL_COMMUNITY): Payer: Self-pay | Admitting: Psychiatry

## 2023-09-28 DIAGNOSIS — F411 Generalized anxiety disorder: Secondary | ICD-10-CM

## 2023-09-28 DIAGNOSIS — F4310 Post-traumatic stress disorder, unspecified: Secondary | ICD-10-CM

## 2023-09-28 DIAGNOSIS — F603 Borderline personality disorder: Secondary | ICD-10-CM

## 2023-09-28 MED ORDER — GABAPENTIN 100 MG PO CAPS: 200.0000 mg | ORAL_CAPSULE | Freq: Three times a day (TID) | ORAL | 2 refills | Status: DC | PRN

## 2023-09-28 MED ORDER — LAMOTRIGINE 100 MG PO TABS: 200.0000 mg | ORAL_TABLET | Freq: Every day | ORAL | 0 refills | Status: DC

## 2023-09-28 MED ORDER — TRAZODONE HCL 100 MG PO TABS
100.0000 mg | ORAL_TABLET | Freq: Every day | ORAL | 0 refills | Status: DC
Start: 2023-09-28 — End: 2023-11-21

## 2023-09-28 MED ORDER — VORTIOXETINE HBR 10 MG PO TABS: 10.0000 mg | ORAL_TABLET | Freq: Every day | ORAL | 2 refills | Status: DC

## 2023-09-29 LAB — URINE CULTURE: Culture: NO GROWTH

## 2023-10-05 ENCOUNTER — Encounter: Payer: Self-pay | Admitting: Family Medicine

## 2023-10-05 ENCOUNTER — Other Ambulatory Visit (HOSPITAL_COMMUNITY): Payer: Self-pay

## 2023-10-05 ENCOUNTER — Telehealth: Payer: Self-pay

## 2023-10-05 ENCOUNTER — Other Ambulatory Visit: Payer: Self-pay | Admitting: Neurology

## 2023-10-05 ENCOUNTER — Encounter: Payer: Self-pay | Admitting: Neurology

## 2023-10-05 DIAGNOSIS — K644 Residual hemorrhoidal skin tags: Secondary | ICD-10-CM

## 2023-10-05 MED ORDER — ALPRAZOLAM 0.25 MG PO TABS: ORAL_TABLET | ORAL | 0 refills | Status: DC

## 2023-10-05 NOTE — Telephone Encounter (Signed)
 Order for xanax, called in.  Pt did receive notification.  She already has a driver.  Appreciated call back.

## 2023-10-05 NOTE — Telephone Encounter (Signed)
 Ordered. thanks

## 2023-10-05 NOTE — Telephone Encounter (Signed)
 I called pt and let her know that our PA team said no PA needed for the 2.4mg .  there is a prescription from 09-26-2023 and should be good to go now.  She will call her pharmacy and speak to them.  She will let us  know if something else is needed.  Appreciated call back.

## 2023-10-05 NOTE — Telephone Encounter (Signed)
 Mri scheduled for 1300 tomorrow.  Needs med for claustrophobia.

## 2023-10-05 NOTE — Addendum Note (Signed)
 Addended by: NEYSA NENA RAMAN on: 10/05/2023 12:16 PM   Modules accepted: Orders

## 2023-10-05 NOTE — Telephone Encounter (Signed)
 Pharmacy Patient Advocate Encounter   Received notification from Physician's Office that prior authorization for Wegovy  2.4mg /0.67ml soaj is required/requested.   Insurance verification completed.   The patient is insured through [employer] .   Per test claim: The current 28 day co-pay is, $4.00.  No PA needed at this time. This test claim was processed through [facility]- copay amounts may vary at other pharmacies due to pharmacy/plan contracts, or as the patient moves through the different stages of their insurance plan.

## 2023-10-06 ENCOUNTER — Other Ambulatory Visit

## 2023-10-09 NOTE — Discharge Instructions (Signed)

## 2023-10-10 ENCOUNTER — Ambulatory Visit
Admission: RE | Admit: 2023-10-10 | Discharge: 2023-10-10 | Disposition: A | Discharge status: Home / Self Care | Source: Ambulatory Visit | Attending: Neurology | Admitting: Neurology

## 2023-10-10 VITALS — BP 114/83 | HR 75

## 2023-10-10 DIAGNOSIS — G8929 Other chronic pain: Secondary | ICD-10-CM | POA: Diagnosis not present

## 2023-10-10 DIAGNOSIS — R519 Headache, unspecified: Secondary | ICD-10-CM | POA: Diagnosis not present

## 2023-10-10 DIAGNOSIS — D485 Neoplasm of uncertain behavior of skin: Secondary | ICD-10-CM | POA: Diagnosis not present

## 2023-10-10 DIAGNOSIS — L732 Hidradenitis suppurativa: Secondary | ICD-10-CM | POA: Diagnosis not present

## 2023-10-11 ENCOUNTER — Ambulatory Visit: Payer: Self-pay | Admitting: Neurology

## 2023-10-12 DIAGNOSIS — G4733 Obstructive sleep apnea (adult) (pediatric): Secondary | ICD-10-CM | POA: Diagnosis not present

## 2023-10-17 ENCOUNTER — Ambulatory Visit
Admission: RE | Admit: 2023-10-17 | Discharge: 2023-10-17 | Disposition: A | Discharge status: Home / Self Care | Source: Ambulatory Visit | Attending: Neurology | Admitting: Neurology

## 2023-10-17 DIAGNOSIS — R519 Headache, unspecified: Secondary | ICD-10-CM

## 2023-10-17 DIAGNOSIS — R51 Headache with orthostatic component, not elsewhere classified: Secondary | ICD-10-CM

## 2023-10-17 DIAGNOSIS — G441 Vascular headache, not elsewhere classified: Secondary | ICD-10-CM

## 2023-10-17 DIAGNOSIS — H9313 Tinnitus, bilateral: Secondary | ICD-10-CM | POA: Diagnosis not present

## 2023-10-17 DIAGNOSIS — H539 Unspecified visual disturbance: Secondary | ICD-10-CM | POA: Diagnosis not present

## 2023-10-17 MED ORDER — GADOPICLENOL 0.5 MMOL/ML IV SOLN
10.0000 mL | Freq: Once | INTRAVENOUS | Status: AC | PRN
  Administered 2023-10-17: 10 mL via INTRAVENOUS

## 2023-10-22 LAB — CSF CELL COUNT WITH DIFFERENTIAL
RBC Count, CSF: 0 {cells}/uL
TOTAL NUCLEATED CELL: 2 {cells}/uL (ref 0–5)

## 2023-10-22 LAB — GLUCOSE, CSF: Glucose, CSF: 44 mg/dL (ref 40–80)

## 2023-10-22 LAB — PROTEIN, CSF: Total Protein, CSF: 38 mg/dL (ref 15–45)

## 2023-10-23 DIAGNOSIS — G4733 Obstructive sleep apnea (adult) (pediatric): Secondary | ICD-10-CM | POA: Diagnosis not present

## 2023-10-23 DIAGNOSIS — R400 Somnolence: Secondary | ICD-10-CM | POA: Diagnosis not present

## 2023-10-24 ENCOUNTER — Telehealth: Admitting: Physician Assistant

## 2023-10-24 DIAGNOSIS — B850 Pediculosis due to Pediculus humanus capitis: Secondary | ICD-10-CM | POA: Diagnosis not present

## 2023-10-24 MED ORDER — BENZYL ALCOHOL 10 % MT GEL: OROMUCOSAL | 0 refills | Status: DC

## 2023-10-24 NOTE — Progress Notes (Deleted)
 E-Visit for Lice  We are sorry that you are not feeling well. Here is how we plan to help!  Based on what you have shared with me it looks like you have head lice.  Lice are very tiny insects that like to live on hair.  An infection with head lice is very common in school-age children, but anyone can get lice.  Head lice do not live on pets and cannot jump, fly or walk on the ground.  But they easily pass from person to person through close contact and on clothes, bed linens, brushes, combs, hats and toys.  Head lice infections are not dangerous but they can be difficult to treat.  Lice are contagious and should be treated right away to stop infection from spreading.   I have prescribed oral Ivermectin 200 mg -- take once. Repeat in 7 days. Ok to use nit comb between these doses to help remove any dead nits in the hair. If any non-resolving, new or worsening symptoms, you will need an in-person evaluation.   HOME CARE:  You should treat all members in your household. Wash towels, clothes, bed linens, cloth toys, hats and other personal items in hot water and dry on high heat. Wash all combs and brushes in very hot soapy water or throw them out and buy new ones. Vacuum floors and furniture and throw out the bag afterwards. Do not go to work or school until the morning after your treatment for lice. If family members are also infected, you may need to notify your child's day care or school so that other children can be checked.  GET HELP RIGHT AWAY IF:  Your treatment does not get rid of lice. You develop sores on your scalp that become infected, worsen or do not heal. You or your child become itchy or are scratching in areas other than the scalp.  MAKE SURE YOU:  Understand these instructions. Will watch your condition. Will get help right away if you are not doing well or get worse.  Your e-visit answers were reviewed by a board certified advanced clinical practitioner to complete your  personal care plan.  Depending upon the condition, your plan could have included both over the counter or prescription medications.    Please review your pharmacy choice.  Make sure the pharmacy is open so you can pick up prescription now.   If there is a problem, you may contact your provider through [employer] and have the prescription routed to another pharmacy. Your safety is important to us .  If you have drug allergies check your prescription carefully.    For the next 24 hours you can use MyChart to ask questions about today's visit, request a non-urgent call back, or ask for a work or school excuse.  You will get an email in the next 2 days asking about your experience.  I hope that your e-visit has been valuable and will speed your recovery.

## 2023-10-24 NOTE — Progress Notes (Signed)
 Message sent to patient requesting further input regarding current symptoms. Awaiting patient response.

## 2023-10-24 NOTE — Progress Notes (Signed)
 E-Visit for Lice  We are sorry that you are not feeling well. Here is how we plan to help!  Based on what you have shared with me it looks like you have head lice.  Lice are very tiny insects that like to live on hair.  An infection with head lice is very common in school-age children, but anyone can get lice.  Head lice do not live on pets and cannot jump, fly or walk on the ground.  But they easily pass from person to person through close contact and on clothes, bed linens, brushes, combs, hats and toys.  Head lice infections are not dangerous but they can be difficult to treat.  Lice are contagious and should be treated right away to stop infection from spreading.   I recommend that you use: I have prescribed Benzyl alcohol  10%. Apply appropriate volume for hair length to dry hair and completely saturate the scalp; leave on for 10 minutes; rinse thoroughly with water; repeat in 7 days.  Be sure to use the nit comb as directed.   HOME CARE:  You should treat all members in your household. Wash towels, clothes, bed linens, cloth toys, hats and other personal items in hot water and dry on high heat. Wash all combs and brushes in very hot soapy water or throw them out and buy new ones. Vacuum floors and furniture and throw out the bag afterwards. Do not go to work or school until the morning after your treatment for lice. If family members are also infected, you may need to notify your child's day care or school so that other children can be checked.  GET HELP RIGHT AWAY IF:  Your treatment does not get rid of lice. You develop sores on your scalp that become infected, worsen or do not heal. You or your child become itchy or are scratching in areas other than the scalp.  MAKE SURE YOU:  Understand these instructions. Will watch your condition. Will get help right away if you are not doing well or get worse.  Your e-visit answers were reviewed by a board certified advanced clinical  practitioner to complete your personal care plan.  Depending upon the condition, your plan could have included both over the counter or prescription medications.    Please review your pharmacy choice.  Make sure the pharmacy is open so you can pick up prescription now.   If there is a problem, you may contact your provider through [employer] and have the prescription routed to another pharmacy. Your safety is important to us .  If you have drug allergies check your prescription carefully.    For the next 24 hours you can use MyChart to ask questions about today's visit, request a non-urgent call back, or ask for a work or school excuse.  You will get an email in the next 2 days asking about your experience.  I hope that your e-visit has been valuable and will speed your recovery.   Approximately 5 minutes was spent documenting and reviewing patient's chart.

## 2023-10-29 ENCOUNTER — Other Ambulatory Visit: Payer: Self-pay | Admitting: Medical Genetics

## 2023-11-03 ENCOUNTER — Other Ambulatory Visit (HOSPITAL_COMMUNITY)

## 2023-11-03 ENCOUNTER — Ambulatory Visit: Payer: Medicaid Other | Admitting: Family Medicine

## 2023-11-03 DIAGNOSIS — Z0184 Encounter for antibody response examination: Secondary | ICD-10-CM | POA: Diagnosis not present

## 2023-11-03 DIAGNOSIS — E559 Vitamin D deficiency, unspecified: Secondary | ICD-10-CM | POA: Diagnosis not present

## 2023-11-03 DIAGNOSIS — F3132 Bipolar disorder, current episode depressed, moderate: Secondary | ICD-10-CM | POA: Diagnosis not present

## 2023-11-03 DIAGNOSIS — E782 Mixed hyperlipidemia: Secondary | ICD-10-CM | POA: Diagnosis not present

## 2023-11-03 DIAGNOSIS — F603 Borderline personality disorder: Secondary | ICD-10-CM | POA: Diagnosis not present

## 2023-11-03 DIAGNOSIS — R112 Nausea with vomiting, unspecified: Secondary | ICD-10-CM

## 2023-11-03 DIAGNOSIS — N200 Calculus of kidney: Secondary | ICD-10-CM | POA: Diagnosis not present

## 2023-11-03 LAB — LIPID PANEL

## 2023-11-03 MED ORDER — ONDANSETRON 4 MG PO TBDP: ORAL_TABLET | ORAL | 0 refills | Status: DC

## 2023-11-03 MED ORDER — KETOROLAC TROMETHAMINE 30 MG/ML IJ SOLN
30.0000 mg | Freq: Once | INTRAMUSCULAR | Status: AC
  Administered 2023-11-03: 30 mg via INTRAMUSCULAR

## 2023-11-03 NOTE — Progress Notes (Signed)
 Subjective:  Patient ID: [name], female    DOB: 01/14/1986, 38 y.o.   MRN: [id number]  Patient Care Team: Severa Rock HERO, FNP as PCP - General (Family Medicine)   Chief Complaint:  Medical Management of Chronic Issues (6 month follow up )   HPI: [name] is a 37 y.o. female presenting on 11/03/2023 for Medical Management of Chronic Issues (6 month follow up )  [name] is a 38 year old female who presents for a regular follow-up.  She experienced a recent episode of severe pain due to a kidney stone, which led her to the emergency room over a month and a half ago. The pain was significant, causing her to spend the past week in bed, and she had difficulty keeping down food and liquids, leading to vomiting and disruption of her medication regimen. She was able to hold down solid food for the first time yesterday since Monday.  She has a history of kidney stones, with the last episode occurring in 2023. She recalls seeing a urologist for her previous kidney stone issues.  Her current medications include vitamin D , rosuvastatin  for cholesterol, and semaglutide  for weight management, which was increased to 2.4 mg last month. No muscle aches or pains from rosuvastatin  and no significant side effects from semaglutide .  She is also taking Lamictal , with blood levels monitored regularly through video visits.  She continues to see her psychiatrist regularly, with the last visit occurring last month or the month before. No changes in her psychiatric medications as they are working well for her.          Relevant past medical, surgical, family, and social history reviewed and updated as indicated.  Allergies and medications reviewed and updated. Data reviewed: Chart in Epic.   Past Medical History:  Diagnosis Date   Borderline personality disorder (HCC)    Headache    Hyperlipidemia 09/25/2021   Hypertension in pregnancy    Knee pain    Nephrolithiasis    Postpartum  depression    Preeclampsia    Sleep apnea     Past Surgical History:  Procedure Laterality Date   APPENDECTOMY     CESAREAN SECTION     CYSTOSCOPY/URETEROSCOPY/HOLMIUM LASER/STENT PLACEMENT Right 04/16/2021   Procedure: CYSTOSCOPY RIGHT RETROGRADE PYELOGRAM  URETEROSCOPY/HOLMIUM LASER/STENT PLACEMENT;  Surgeon: Watt Rush, MD;  Location: [facility];  Service: Urology;  Laterality: Right;   HOLMIUM LASER APPLICATION Right 04/16/2021   Procedure: HOLMIUM LASER APPLICATION;  Surgeon: Watt Rush, MD;  Location: [facility];  Service: Urology;  Laterality: Right;   KIDNEY STONE SURGERY  04/16/2021   r-kidney   KNEE SURGERY Right    scoped x2   MEDIAL PATELLOFEMORAL LIGAMENT REPAIR Right 05/04/2023   Procedure: MEDIAL PATELLA FEMORAL LIGAMENT RECONSTRUCTION WITH OSTEOCHONDRAL AUTOGRAFT;  Surgeon: Cristy Bonner DASEN, MD;  Location: [facility];  Service: Orthopedics;  Laterality: Right;   WISDOM TOOTH EXTRACTION  2010    Social History   Socioeconomic History   Marital status: Significant Other    Spouse name: Not on file   Number of children: Not on file   Years of education: Not on file   Highest education level: Some college, no degree  Occupational History   Not on file  Tobacco Use   Smoking status: Never   Smokeless tobacco: Never  Vaping Use   Vaping status: Never Used  Substance and Sexual Activity   Alcohol  use: No  Drug use: Not Currently   Sexual activity: Yes    Birth control/protection: None  Other Topics Concern   Not on file  Social History Narrative   Caffiene 300mg  daily   Working : delivery service    Social Drivers of Health   Financial Resource Strain: High Risk (11/03/2023)   Overall Financial Resource Strain (CARDIA)    Difficulty of Paying Living Expenses: Hard  Food Insecurity: Food Insecurity Present (11/03/2023)   Hunger Vital Sign    Worried About Running Out of Food in the Last Year: Sometimes true     Ran Out of Food in the Last Year: Sometimes true  Transportation Needs: Unmet Transportation Needs (11/03/2023)   PRAPARE - Transportation    Lack of Transportation (Medical): Yes    Lack of Transportation (Non-Medical): Yes  Physical Activity: Insufficiently Active (11/03/2023)   Exercise Vital Sign    Days of Exercise per Week: 4 days    Minutes of Exercise per Session: 20 min  Stress: Stress Concern Present (11/03/2023)   [employer] of Occupational Health - Occupational Stress Questionnaire    Feeling of Stress: To some extent  Social Connections: Moderately Integrated (11/03/2023)   Social Connection and Isolation Panel    Frequency of Communication with Friends and Family: More than three times a week    Frequency of Social Gatherings with Friends and Family: More than three times a week    Attends Religious Services: Patient declined    Active Member of Clubs or Organizations: Yes    Attends Banker Meetings: More than 4 times per year    Marital Status: Living with partner  Intimate Partner Violence: Not on file    Outpatient Encounter Medications as of 11/03/2023  Medication Sig   candesartan  (ATACAND ) 8 MG tablet Take 1 tablet (8 mg total) by mouth daily.   clindamycin (CLEOCIN T) 1 % SWAB Apply topically 2 (two) times daily.   doxycycline  (VIBRAMYCIN ) 100 MG capsule Take 100 mg by mouth daily.   Fremanezumab -vfrm (AJOVY ) 225 MG/1.5ML SOAJ Inject 225 mg into the skin every 30 (thirty) days.   gabapentin  (NEURONTIN ) 100 MG capsule Take 2 capsules (200 mg total) by mouth 3 (three) times daily as needed (anxiety).   HYDROcodone -acetaminophen  (NORCO/VICODIN) 5-325 MG tablet Take 1 tablet by mouth every 6 (six) hours as needed.   lamoTRIgine  (LAMICTAL ) 100 MG tablet Take 2 tablets (200 mg total) by mouth daily.   rizatriptan  (MAXALT -MLT) 10 MG disintegrating tablet Take 1 tablet (10 mg total) by mouth as needed for migraine. May repeat in 2 hours if needed    rosuvastatin  (CRESTOR ) 40 MG tablet Take 1 tablet (40 mg total) by mouth daily.   Semaglutide -Weight Management (WEGOVY ) 2.4 MG/0.75ML SOAJ Inject 2.4 mg into the skin once a week.   traZODone  (DESYREL ) 100 MG tablet Take 1 tablet (100 mg total) by mouth at bedtime.   Vitamin D , Ergocalciferol , (DRISDOL ) 1.25 MG (50000 UNIT) CAPS capsule Take 1 capsule (50,000 Units total) by mouth every 7 (seven) days.   vortioxetine  HBr (TRINTELLIX ) 10 MG TABS tablet Take 1 tablet (10 mg total) by mouth daily.   [DISCONTINUED] ondansetron  (ZOFRAN -ODT) 4 MG disintegrating tablet 4mg  ODT q4 hours prn nausea/vomit   ondansetron  (ZOFRAN -ODT) 4 MG disintegrating tablet 4mg  ODT q4 hours prn nausea/vomit   [DISCONTINUED] BENZYL ALCOHOL , ANESTHETIC, 10 % GEL Apply appropriate volume for hair length to dry hair and completely saturate the scalp; leave on for 10 minutes; rinse thoroughly with water; repeat in  7 days.  Be sure to use the nit comb as directed.   No facility-administered encounter medications on file as of 11/03/2023.    Allergies  Allergen Reactions   Hydroquinone Anaphylaxis, Shortness Of Breath and Swelling    Any bleach products; Facial swelling   Sodium Hypochlorite Shortness Of Breath    Any bleach products    Keflex [Cephalexin] Rash   Latex Rash    Pertinent ROS per HPI, otherwise unremarkable      Objective:  BP 105/77   Pulse 86   Temp 97.6 F (36.4 C)   Ht 5' 6 (1.676 m)   Wt 219 lb 12.8 oz (99.7 kg)   LMP 10/27/2023   SpO2 98%   BMI 35.48 kg/m    Wt Readings from Last 3 Encounters:  11/03/23 219 lb 12.8 oz (99.7 kg)  09/27/23 242 lb 8.1 oz (110 kg)  05/05/23 243 lb (110.2 kg)    Physical Exam Vitals and nursing note reviewed.  Constitutional:      General: She is not in acute distress.    Appearance: Normal appearance. She is well-developed and well-groomed. She is morbidly obese. She is not ill-appearing, toxic-appearing or diaphoretic.  HENT:     Head:  Normocephalic and atraumatic.     Jaw: There is normal jaw occlusion.     Right Ear: Hearing normal.     Left Ear: Hearing normal.     Nose: Nose normal.     Mouth/Throat:     Lips: Pink.     Mouth: Mucous membranes are moist.     Pharynx: Oropharynx is clear. Uvula midline.  Eyes:     General: Lids are normal.     Extraocular Movements: Extraocular movements intact.     Conjunctiva/sclera: Conjunctivae normal.     Pupils: Pupils are equal, round, and reactive to light.  Neck:     Thyroid : No thyroid  mass, thyromegaly or thyroid  tenderness.     Vascular: No carotid bruit or JVD.     Trachea: Trachea and phonation normal.  Cardiovascular:     Rate and Rhythm: Normal rate and regular rhythm.     Chest Wall: PMI is not displaced.     Pulses: Normal pulses.     Heart sounds: Normal heart sounds. No murmur heard.    No friction rub. No gallop.  Pulmonary:     Effort: Pulmonary effort is normal. No respiratory distress.     Breath sounds: Normal breath sounds. No wheezing.  Abdominal:     General: Bowel sounds are normal. There is no distension or abdominal bruit.     Palpations: Abdomen is soft. There is no hepatomegaly or splenomegaly.     Tenderness: There is no abdominal tenderness. There is no right CVA tenderness or left CVA tenderness.     Hernia: No hernia is present.  Musculoskeletal:        General: Normal range of motion.     Cervical back: Normal range of motion and neck supple.     Right lower leg: No edema.     Left lower leg: No edema.  Lymphadenopathy:     Cervical: No cervical adenopathy.  Skin:    General: Skin is warm and dry.     Capillary Refill: Capillary refill takes less than 2 seconds.     Coloration: Skin is not cyanotic, jaundiced or pale.     Findings: No rash.  Neurological:     General: No focal deficit present.  Mental Status: She is alert and oriented to person, place, and time.     Sensory: Sensation is intact.     Motor: Motor function is  intact.     Coordination: Coordination is intact.     Gait: Gait is intact.     Deep Tendon Reflexes: Reflexes are normal and symmetric.  Psychiatric:        Attention and Perception: Attention and perception normal.        Mood and Affect: Mood and affect normal.        Speech: Speech normal.        Behavior: Behavior normal. Behavior is cooperative.        Thought Content: Thought content normal.        Cognition and Memory: Cognition and memory normal.        Judgment: Judgment normal.     Results for orders placed or performed during the hospital encounter of 10/10/23  Glucose, CSF   Collection Time: 10/10/23 12:04 PM  Result Value Ref Range   Glucose, CSF 44 40 - 80 mg/dL  Protein, CSF   Collection Time: 10/10/23 12:04 PM  Result Value Ref Range   Total Protein, CSF 38 15 - 45 mg/dL  CSF cell count with differential   Collection Time: 10/10/23 12:04 PM  Result Value Ref Range   Color, CSF COLORLESS COLORLESS   Appearance, CSF CLEAR CLEAR   RBC Count, CSF 0 0 cells/uL   TOTAL NUCLEATED CELL 2 0 - 5 cells/uL   Monocyte/Macrophage CANCELED        Pertinent labs & imaging results that were available during my care of the patient were reviewed by me and considered in my medical decision making.  Assessment & Plan:  Tkeyah was seen today for medical management of chronic issues.  Diagnoses and all orders for this visit:  Morbid obesity (HCC) -     Thyroid  Panel With TSH -     Vitamin D , 25-hydroxy -     CMP14+EGFR -     CBC with Differential/Platelet -     Lipid panel  Mixed hyperlipidemia -     CMP14+EGFR -     Lipid panel  Vitamin D  deficiency -     Vitamin D , 25-hydroxy -     CMP14+EGFR  Bipolar affective disorder, currently depressed, moderate (HCC) -     Thyroid  Panel With TSH -     Lamotrigine  level  Borderline personality disorder (HCC) -     Thyroid  Panel With TSH -     Lamotrigine  level  Immunity status testing -     Hepatitis B surface  antibody,qualitative  Renal stones -     Ambulatory referral to Urology  Nausea and vomiting in adult -     ondansetron  (ZOFRAN -ODT) 4 MG disintegrating tablet; 4mg  ODT q4 hours prn nausea/vomit     Nephrolithiasis Presents with a recent episode of kidney stones over a month and a half ago, experiencing significant pain and nausea, with difficulty maintaining oral intake. The pain has been severe enough to cause bed confinement for the past week. She has a history of kidney stones in 2023 and has been advised to follow up with urology, specifically with Dr. Sedonia, whom she has seen previously. A Toradol  injection is considered beneficial for reducing ureteral inflammation and facilitating stone passage. - Refer to Dr. Sedonia for urology follow-up. - Administer Toradol  injection in the office to reduce ureteral inflammation and facilitate stone passage. - Advise increased  fluid intake. - Prescribe antiemetic medication as needed.  Hyperlipidemia Currently on rosuvastatin  with previously elevated cholesterol levels. A recheck is planned to ensure levels are within the desired range. Reports no myalgia associated with rosuvastatin  use. - Recheck cholesterol levels.  Obesity On semaglutide  for weight management, with a recent dose increase to 2.4 mg. Reports no significant side effects.  General Health Maintenance Taking vitamin D  supplements. Requires updated labs, including thyroid  function and liver enzymes, which were previously ordered but not drawn. Lamictal  levels will also be checked to ensure therapeutic range. - Order vitamin D  level. - Order thyroid  function tests. - Repeat liver enzyme tests. - Check Lamictal  levels.  Follow-up Advised to follow up in six months unless new issues arise. - Schedule follow-up appointment in six months.          Continue all other maintenance medications.  Follow up plan: Return in about 6 months (around 05/05/2024), or if symptoms worsen or  fail to improve, for Annual Physical.   Continue healthy lifestyle choices, including diet (rich in fruits, vegetables, and lean proteins, and low in salt and simple carbohydrates) and exercise (at least 30 minutes of moderate physical activity daily).   The above assessment and management plan was discussed with the patient. The patient verbalized understanding of and has agreed to the management plan. Patient is aware to call the clinic if they develop any new symptoms or if symptoms persist or worsen. Patient is aware when to return to the clinic for a follow-up visit. Patient educated on when it is appropriate to go to the emergency department.   Rosaline Bruns, FNP-C [facility] [phone]

## 2023-11-03 NOTE — Addendum Note (Signed)
 Addended by: OLENA HARLENE BROCKS on: 11/03/2023 09:41 AM   Modules accepted: Orders

## 2023-11-06 ENCOUNTER — Encounter

## 2023-11-06 ENCOUNTER — Telehealth: Admitting: Physician Assistant

## 2023-11-06 ENCOUNTER — Encounter (INDEPENDENT_AMBULATORY_CARE_PROVIDER_SITE_OTHER): Payer: Self-pay | Admitting: *Deleted

## 2023-11-06 DIAGNOSIS — N200 Calculus of kidney: Secondary | ICD-10-CM

## 2023-11-06 DIAGNOSIS — R300 Dysuria: Secondary | ICD-10-CM

## 2023-11-06 DIAGNOSIS — R112 Nausea with vomiting, unspecified: Secondary | ICD-10-CM

## 2023-11-06 LAB — CMP14+EGFR
ALT: 31 IU/L (ref 0–32)
AST: 32 IU/L (ref 0–40)
Albumin: 4.9 g/dL (ref 3.9–4.9)
Alkaline Phosphatase: 68 IU/L (ref 44–121)
BUN/Creatinine Ratio: 10 (ref 9–23)
BUN: 9 mg/dL (ref 6–20)
Bilirubin Total: 1.6 mg/dL — ABNORMAL HIGH (ref 0.0–1.2)
CO2: 20 mmol/L (ref 20–29)
Calcium: 10.3 mg/dL — ABNORMAL HIGH (ref 8.7–10.2)
Chloride: 100 mmol/L (ref 96–106)
Creatinine, Ser: 0.94 mg/dL (ref 0.57–1.00)
Globulin, Total: 2 g/dL (ref 1.5–4.5)
Glucose: 91 mg/dL (ref 70–99)
Potassium: 4.2 mmol/L (ref 3.5–5.2)
Sodium: 140 mmol/L (ref 134–144)
Total Protein: 6.9 g/dL (ref 6.0–8.5)
eGFR: 81 mL/min/1.73 (ref >59)

## 2023-11-06 LAB — LIPID PANEL
Chol/HDL Ratio: 2.7 ratio (ref 0.0–4.4)
Cholesterol, Total: 133 mg/dL (ref 100–199)
HDL: 50 mg/dL (ref >39)
LDL Chol Calc (NIH): 63 mg/dL (ref 0–99)
Triglycerides: 108 mg/dL (ref 0–149)
VLDL Cholesterol Cal: 20 mg/dL (ref 5–40)

## 2023-11-06 LAB — CBC WITH DIFFERENTIAL/PLATELET
Basophils Absolute: 0 x10E3/uL (ref 0.0–0.2)
Basos: 1 %
EOS (ABSOLUTE): 0 x10E3/uL (ref 0.0–0.4)
Eos: 1 %
Hematocrit: 44.3 % (ref 34.0–46.6)
Hemoglobin: 14.3 g/dL (ref 11.1–15.9)
Immature Grans (Abs): 0 x10E3/uL (ref 0.0–0.1)
Immature Granulocytes: 0 %
Lymphocytes Absolute: 1.6 x10E3/uL (ref 0.7–3.1)
Lymphs: 45 %
MCH: 31 pg (ref 26.6–33.0)
MCHC: 32.3 g/dL (ref 31.5–35.7)
MCV: 96 fL (ref 79–97)
Monocytes Absolute: 0.3 x10E3/uL (ref 0.1–0.9)
Monocytes: 9 %
Neutrophils Absolute: 1.5 x10E3/uL (ref 1.4–7.0)
Neutrophils: 44 %
Platelets: 247 x10E3/uL (ref 150–450)
RBC: 4.62 x10E6/uL (ref 3.77–5.28)
RDW: 12.9 % (ref 11.7–15.4)
WBC: 3.5 x10E3/uL (ref 3.4–10.8)

## 2023-11-06 LAB — HEPATITIS B SURFACE ANTIBODY,QUALITATIVE: Hep B Surface Ab, Qual: NONREACTIVE

## 2023-11-06 LAB — THYROID PANEL WITH TSH
Free Thyroxine Index: 3.1 (ref 1.2–4.9)
T3 Uptake Ratio: 25 % (ref 24–39)
T4, Total: 12.5 ug/dL — ABNORMAL HIGH (ref 4.5–12.0)
TSH: 0.86 u[IU]/mL (ref 0.450–4.500)

## 2023-11-06 LAB — VITAMIN D 25 HYDROXY (VIT D DEFICIENCY, FRACTURES): Vit D, 25-Hydroxy: 38.8 ng/mL (ref 30.0–100.0)

## 2023-11-06 LAB — LAMOTRIGINE LEVEL: Lamotrigine Lvl: <1 ug/mL — AB (ref 2.0–20.0)

## 2023-11-07 ENCOUNTER — Telehealth: Admitting: Physician Assistant

## 2023-11-07 ENCOUNTER — Ambulatory Visit: Payer: Self-pay | Admitting: Family Medicine

## 2023-11-07 NOTE — Progress Notes (Signed)
  Because of symptomatic kidney stones and need to rule out a more complicated UTI (kidney infection), I feel your condition warrants further evaluation and I recommend that you be seen in an in-person visit.    NOTE: There will be NO CHARGE for this E-Visit   If you are having a true medical emergency, please call 911.     For an urgent face to face visit, [facility] has multiple urgent care centers for your convenience.  Click the link below for the full list of locations and hours, walk-in wait times, appointment scheduling options and driving directions:  Urgent Care - [place], [place], [place], [place], [place], [place]  [place]     Your MyChart E-visit questionnaire answers were reviewed by a board certified advanced clinical practitioner to complete your personal care plan based on your specific symptoms.    Thank you for using e-Visits.

## 2023-11-07 NOTE — Progress Notes (Signed)
 Duplicate encounter

## 2023-11-08 ENCOUNTER — Telehealth: Admitting: Family Medicine

## 2023-11-08 ENCOUNTER — Ambulatory Visit

## 2023-11-08 DIAGNOSIS — R3989 Other symptoms and signs involving the genitourinary system: Secondary | ICD-10-CM

## 2023-11-08 MED ORDER — FLUCONAZOLE 150 MG PO TABS: 150.0000 mg | ORAL_TABLET | Freq: Every day | ORAL | 0 refills | Status: AC

## 2023-11-08 MED ORDER — NITROFURANTOIN MONOHYD MACRO 100 MG PO CAPS: 100.0000 mg | ORAL_CAPSULE | Freq: Two times a day (BID) | ORAL | 0 refills | Status: DC

## 2023-11-08 NOTE — Progress Notes (Signed)

## 2023-11-09 ENCOUNTER — Encounter (HOSPITAL_COMMUNITY): Payer: Self-pay

## 2023-11-10 ENCOUNTER — Other Ambulatory Visit (HOSPITAL_COMMUNITY)

## 2023-11-12 DIAGNOSIS — G4733 Obstructive sleep apnea (adult) (pediatric): Secondary | ICD-10-CM | POA: Diagnosis not present

## 2023-11-15 ENCOUNTER — Encounter (INDEPENDENT_AMBULATORY_CARE_PROVIDER_SITE_OTHER): Payer: Self-pay | Admitting: Gastroenterology

## 2023-11-15 ENCOUNTER — Ambulatory Visit (INDEPENDENT_AMBULATORY_CARE_PROVIDER_SITE_OTHER): Admitting: Gastroenterology

## 2023-11-15 VITALS — BP 107/75 | HR 81 | Temp 97.5°F | Ht 66.0 in | Wt 213.2 lb

## 2023-11-15 DIAGNOSIS — K5909 Other constipation: Secondary | ICD-10-CM | POA: Diagnosis not present

## 2023-11-15 DIAGNOSIS — K921 Melena: Secondary | ICD-10-CM

## 2023-11-15 DIAGNOSIS — K581 Irritable bowel syndrome with constipation: Secondary | ICD-10-CM | POA: Insufficient documentation

## 2023-11-15 DIAGNOSIS — K641 Second degree hemorrhoids: Secondary | ICD-10-CM | POA: Insufficient documentation

## 2023-11-15 DIAGNOSIS — K5904 Chronic idiopathic constipation: Secondary | ICD-10-CM | POA: Insufficient documentation

## 2023-11-15 MED ORDER — LINACLOTIDE 145 MCG PO CAPS: 145.0000 ug | ORAL_CAPSULE | Freq: Every day | ORAL | 1 refills | Status: AC

## 2023-11-15 MED ORDER — PSYLLIUM 58.6 % PO PACK: 1.0000 | PACK | Freq: Two times a day (BID) | ORAL | 2 refills | Status: DC

## 2023-11-15 MED ORDER — POLYETHYLENE GLYCOL 3350 17 G PO PACK: 17.0000 g | PACK | Freq: Two times a day (BID) | ORAL | 0 refills | Status: DC

## 2023-11-15 MED ORDER — HYDROCORTISONE ACETATE 25 MG RE SUPP
25.0000 mg | Freq: Every day | RECTAL | 0 refills | Status: DC
Start: 2023-11-15 — End: 2024-01-10

## 2023-11-15 NOTE — Patient Instructions (Signed)
 It was very nice to meet you today, as dicussed with will plan for the following :  1) Colonoscopy   2) anusol  suppository  3)Ensure adequate fluid intake: Aim for 8 glasses of water daily. Follow a high fiber diet: Include foods such as dates, prunes, pears, and kiwi. Take Miralax  twice a day for the first week, then reduce to once daily thereafter. Use Metamucil twice a day.   4)Start linzess   Linzess  works best when taken once a day every day, on an empty stomach, at least 30 minutes before your first meal of the day.  When Linzess  is taken daily as directed:Diarrhea, nausea or abdominal cramping may occur in the first 2 weeks -keep taking it.  These symptoms should eventually resolve. Please notify us  if having more than 4 watery bowel movements per day or fecal soiling accidents.

## 2023-11-15 NOTE — Progress Notes (Signed)
 Virginio Isidore Faizan Treysen Sudbeck , M.D. Gastroenterology & Hepatology [facility] Gastroenterology [address] [place], [place] [address] Primary Care Physician: Severa Rock HERO, FNP [address] [place] [address]  Chief Complaint: Chronic constipation hematochezia  History of Present Illness: [name] is a 38 y.o. female with history of kidney stones who presents for evaluation of chronic constipation with abdominal pain and hematochezia.  Patient reports that she has been suffering from constipation for most of her life.  She would have 1 bowel movement every 1 to 2 weeks which would be initially solid and hard followed by a lot of diarrhea.  This is accompanied with abdominal discomfort relieved with defecation  Patient reports protrusion from her anus which she thinks is external hemorrhoids and recently with bleeding.   Patient has tried Metamucil and MiraLAX  but without much relief  The patient denies having any nausea, vomiting, fever, chills,melena, hematemesis, diarrhea, jaundice, pruritus or weight loss.  Labs from 10/2023 normal liver enzymes hemoglobin 14.3 Hepatitis B surface antibody negative Hepatitis C negative  Last ZHI:wnwz Last Colonoscopy:none  FHx: Mother had colon polyps, grandmother colon cancer Social: neg smoking, alcohol  or illicit drug use Surgical: Appendectomy  Past Medical History: Past Medical History:  Diagnosis Date   Borderline personality disorder (HCC)    Headache    Hyperlipidemia 09/25/2021   Hypertension in pregnancy    Knee pain    Nephrolithiasis    Postpartum depression    Preeclampsia    Sleep apnea     Past Surgical History: Past Surgical History:  Procedure Laterality Date   APPENDECTOMY     CESAREAN SECTION     CYSTOSCOPY/URETEROSCOPY/HOLMIUM LASER/STENT PLACEMENT Right 04/16/2021   Procedure: CYSTOSCOPY RIGHT RETROGRADE PYELOGRAM  URETEROSCOPY/HOLMIUM LASER/STENT PLACEMENT;  Surgeon: Watt Rush, MD;  Location: [facility];  Service: Urology;  Laterality: Right;   HOLMIUM LASER APPLICATION Right 04/16/2021   Procedure: HOLMIUM LASER APPLICATION;  Surgeon: Watt Rush, MD;  Location: [facility];  Service: Urology;  Laterality: Right;   KIDNEY STONE SURGERY  04/16/2021   r-kidney   KNEE SURGERY Right    scoped x2   MEDIAL PATELLOFEMORAL LIGAMENT REPAIR Right 05/04/2023   Procedure: MEDIAL PATELLA FEMORAL LIGAMENT RECONSTRUCTION WITH OSTEOCHONDRAL AUTOGRAFT;  Surgeon: Cristy Bonner DASEN, MD;  Location: [facility];  Service: Orthopedics;  Laterality: Right;   WISDOM TOOTH EXTRACTION  2010    Family History: Family History  Problem Relation Age of Onset   Migraines Mother    Hypertension Mother    COPD Mother    Healthy Father    Migraines Maternal Grandmother    Stroke Maternal Grandmother    Diabetes Maternal Grandfather    Hypertension Maternal Grandfather     Social History: Social History   Tobacco Use  Smoking Status Never  Smokeless Tobacco Never   Social History   Substance and Sexual Activity  Alcohol  Use No   Social History   Substance and Sexual Activity  Drug Use Not Currently    Allergies: Allergies  Allergen Reactions   Hydroquinone Anaphylaxis, Shortness Of Breath and Swelling    Any bleach products; Facial swelling   Sodium Hypochlorite Shortness Of Breath    Any bleach products    Keflex [Cephalexin] Rash   Latex Rash    Medications: Current Outpatient Medications  Medication Sig Dispense Refill   candesartan  (ATACAND ) 8 MG tablet Take 1 tablet (8 mg total) by mouth daily. 60 tablet 2  clindamycin (CLEOCIN T) 1 % SWAB Apply topically 2 (two) times daily.     doxycycline  (VIBRAMYCIN ) 100 MG capsule Take 100 mg by mouth daily.     Fremanezumab -vfrm (AJOVY ) 225 MG/1.5ML SOAJ Inject 225 mg into the skin every 30 (thirty) days. 1.5 mL 11   gabapentin  (NEURONTIN ) 100 MG capsule Take 2 capsules  (200 mg total) by mouth 3 (three) times daily as needed (anxiety). 180 capsule 2   HYDROcodone -acetaminophen  (NORCO/VICODIN) 5-325 MG tablet Take 1 tablet by mouth every 6 (six) hours as needed. 20 tablet 0   lamoTRIgine  (LAMICTAL ) 100 MG tablet Take 2 tablets (200 mg total) by mouth daily. 180 tablet 0   ondansetron  (ZOFRAN -ODT) 4 MG disintegrating tablet 4mg  ODT q4 hours prn nausea/vomit 10 tablet 0   rizatriptan  (MAXALT -MLT) 10 MG disintegrating tablet Take 1 tablet (10 mg total) by mouth as needed for migraine. May repeat in 2 hours if needed 9 tablet 11   rosuvastatin  (CRESTOR ) 40 MG tablet Take 1 tablet (40 mg total) by mouth daily. 90 tablet 4   Semaglutide -Weight Management (WEGOVY ) 2.4 MG/0.75ML SOAJ Inject 2.4 mg into the skin once a week. 9 mL 4   traZODone  (DESYREL ) 100 MG tablet Take 1 tablet (100 mg total) by mouth at bedtime. 90 tablet 0   Vitamin D , Ergocalciferol , (DRISDOL ) 1.25 MG (50000 UNIT) CAPS capsule Take 1 capsule (50,000 Units total) by mouth every 7 (seven) days. 5 capsule 5   vortioxetine  HBr (TRINTELLIX ) 10 MG TABS tablet Take 1 tablet (10 mg total) by mouth daily. 30 tablet 2   No current facility-administered medications for this visit.    Review of Systems: GENERAL: negative for malaise, night sweats HEENT: No changes in hearing or vision, no nose bleeds or other nasal problems. NECK: Negative for lumps, goiter, pain and significant neck swelling RESPIRATORY: Negative for cough, wheezing CARDIOVASCULAR: Negative for chest pain, leg swelling, palpitations, orthopnea GI: SEE HPI MUSCULOSKELETAL: Negative for joint pain or swelling, back pain, and muscle pain. SKIN: Negative for lesions, rash HEMATOLOGY Negative for prolonged bleeding, bruising easily, and swollen nodes. ENDOCRINE: Negative for cold or heat intolerance, polyuria, polydipsia and goiter. NEURO: negative for tremor, gait imbalance, syncope and seizures. The remainder of the review of systems is  noncontributory.   Physical Exam: BP 107/75 (BP Location: Left Arm, Patient Position: Sitting, Cuff Size: Large)   Pulse 81   Temp (!) 97.5 F (36.4 C) (Temporal)   Ht 5' 6 (1.676 m)   Wt 213 lb 3.2 oz (96.7 kg)   LMP 10/27/2023   BMI 34.41 kg/m  GENERAL: The patient is AO x3, in no acute distress. HEENT: Head is normocephalic and atraumatic. EOMI are intact. Mouth is well hydrated and without lesions. NECK: Supple. No masses LUNGS: Clear to auscultation. No presence of rhonchi/wheezing/rales. Adequate chest expansion HEART: RRR, normal s1 and s2. ABDOMEN: Soft, nontender, no guarding, no peritoneal signs, and nondistended. BS +. No masses.  Imaging/Labs: as above     Latest Ref Rng & Units 11/03/2023    9:29 AM 09/27/2023    7:12 PM 05/05/2023    9:14 AM  CBC  WBC 3.4 - 10.8 x10E3/uL 3.5  5.2  10.1   Hemoglobin 11.1 - 15.9 g/dL 85.6  87.8  88.4   Hematocrit 34.0 - 46.6 % 44.3  36.3  34.3   Platelets 150 - 450 x10E3/uL 247  237  281    No results found for: IRON, TIBC, FERRITIN  I personally reviewed and interpreted  the available labs, imaging and endoscopic files.  2022  Obstructing 6 mm stone in the distal right ureter, with severe  upstream hydroureteronephrosis, perinephric and periureteral  inflammation.   Additional 2 mm nonobstructive stone in the upper pole the right  kidney.   Impression and Plan:  [name] is a 38 y.o. female with history of kidney stones who presents for evaluation of chronic constipation with abdominal pain and hematochezia.  #Constipation with abdominal pain  #Hematochezia  Patient has severe constipation with a bowel movement every 1 to 2 weeks not very responsive to Metamucil MiraLAX   She likely has irritable bowel syndrome constipation type of chronic idiopathic constipation  As per ACG guideline we will start patient on linaclotide  145 mcg titrate as tolerated  Also hematochezia could be benign disease of anorectal  such as hemorrhoids but without colonoscopy unable to rule out underlying polyps or inflammatory bowel disease  Procedure colonoscopy  Anusol  suppository  Linzess  145 mcg  Ensure adequate fluid intake: Aim for 8 glasses of water daily. Follow a high fiber diet: Include foods such as dates, prunes, pears, and kiwi. Take Miralax  twice a day for the first week, then reduce to once daily thereafter. Use Metamucil twice a day.   All questions were answered.      Colleen Esty Faizan Vada Yellen, MD Gastroenterology and Hepatology [facility] Gastroenterology   This chart has been completed using [facility] Dictation software, and while attempts have been made to ensure accuracy , certain words and phrases may not be transcribed as intended

## 2023-11-19 ENCOUNTER — Encounter (HOSPITAL_COMMUNITY): Payer: Self-pay
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
## 2023-11-21 ENCOUNTER — Other Ambulatory Visit: Payer: Self-pay | Admitting: *Deleted

## 2023-11-21 ENCOUNTER — Telehealth: Payer: Self-pay | Admitting: *Deleted

## 2023-11-21 ENCOUNTER — Encounter (HOSPITAL_COMMUNITY): Payer: Self-pay | Admitting: Psychiatry

## 2023-11-21 ENCOUNTER — Encounter: Payer: Self-pay | Admitting: *Deleted

## 2023-11-21 ENCOUNTER — Telehealth (HOSPITAL_COMMUNITY): Admitting: Psychiatry

## 2023-11-21 DIAGNOSIS — D485 Neoplasm of uncertain behavior of skin: Secondary | ICD-10-CM | POA: Diagnosis not present

## 2023-11-21 DIAGNOSIS — F411 Generalized anxiety disorder: Secondary | ICD-10-CM | POA: Diagnosis not present

## 2023-11-21 DIAGNOSIS — F4310 Post-traumatic stress disorder, unspecified: Secondary | ICD-10-CM

## 2023-11-21 DIAGNOSIS — F603 Borderline personality disorder: Secondary | ICD-10-CM

## 2023-11-21 DIAGNOSIS — L732 Hidradenitis suppurativa: Secondary | ICD-10-CM | POA: Diagnosis not present

## 2023-11-21 DIAGNOSIS — K921 Melena: Secondary | ICD-10-CM

## 2023-11-21 MED ORDER — LAMOTRIGINE 100 MG PO TABS: 200.0000 mg | ORAL_TABLET | Freq: Every day | ORAL | 0 refills | Status: DC

## 2023-11-21 MED ORDER — TRAZODONE HCL 100 MG PO TABS: 100.0000 mg | ORAL_TABLET | Freq: Every day | ORAL | 0 refills | Status: DC

## 2023-11-21 MED ORDER — GABAPENTIN 300 MG PO CAPS
300.0000 mg | ORAL_CAPSULE | Freq: Four times a day (QID) | ORAL | 2 refills | Status: DC
Start: 2023-11-21 — End: 2023-12-28

## 2023-11-21 MED ORDER — PEG 3350-KCL-NA BICARB-NACL 420 G PO SOLR: 4000.0000 mL | Freq: Once | ORAL | 0 refills | Status: AC

## 2023-11-21 MED ORDER — VORTIOXETINE HBR 10 MG PO TABS: 10.0000 mg | ORAL_TABLET | Freq: Every day | ORAL | 2 refills | Status: DC

## 2023-11-21 MED ORDER — PROPRANOLOL HCL 10 MG PO TABS: 10.0000 mg | ORAL_TABLET | Freq: Three times a day (TID) | ORAL | 2 refills | Status: AC | PRN

## 2023-11-21 NOTE — Telephone Encounter (Signed)
 [employer] PA for [employer]: 01/05/2024 - 04/05/2024  [id number] 1 Unit(s) [id number] NOT REQUIRED (Certification Not Required for this Service)

## 2023-11-22 ENCOUNTER — Encounter: Payer: Self-pay | Admitting: Neurology

## 2023-11-22 ENCOUNTER — Telehealth (HOSPITAL_COMMUNITY): Admitting: Psychiatry

## 2023-11-28 DIAGNOSIS — R109 Unspecified abdominal pain: Secondary | ICD-10-CM | POA: Diagnosis not present

## 2023-11-28 DIAGNOSIS — N202 Calculus of kidney with calculus of ureter: Secondary | ICD-10-CM | POA: Diagnosis not present

## 2023-12-13 DIAGNOSIS — G4733 Obstructive sleep apnea (adult) (pediatric): Secondary | ICD-10-CM | POA: Diagnosis not present

## 2023-12-15 ENCOUNTER — Encounter: Payer: Self-pay | Admitting: Family Medicine

## 2023-12-15 DIAGNOSIS — N201 Calculus of ureter: Secondary | ICD-10-CM | POA: Diagnosis not present

## 2023-12-19 ENCOUNTER — Other Ambulatory Visit: Payer: Self-pay | Admitting: Urology

## 2023-12-19 ENCOUNTER — Encounter (INDEPENDENT_AMBULATORY_CARE_PROVIDER_SITE_OTHER): Payer: Self-pay | Admitting: Gastroenterology
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
## 2023-12-28 ENCOUNTER — Encounter (HOSPITAL_COMMUNITY): Payer: Self-pay | Admitting: Psychiatry

## 2023-12-28 ENCOUNTER — Telehealth: Payer: Self-pay

## 2023-12-28 ENCOUNTER — Encounter: Payer: Self-pay | Admitting: Neurology

## 2023-12-28 ENCOUNTER — Telehealth (HOSPITAL_COMMUNITY): Payer: Self-pay | Admitting: Psychiatry

## 2023-12-28 ENCOUNTER — Telehealth (HOSPITAL_COMMUNITY): Admitting: Psychiatry

## 2023-12-28 ENCOUNTER — Other Ambulatory Visit (HOSPITAL_COMMUNITY): Payer: Self-pay

## 2023-12-28 DIAGNOSIS — F4310 Post-traumatic stress disorder, unspecified: Secondary | ICD-10-CM

## 2023-12-28 DIAGNOSIS — F603 Borderline personality disorder: Secondary | ICD-10-CM | POA: Diagnosis not present

## 2023-12-28 DIAGNOSIS — F411 Generalized anxiety disorder: Secondary | ICD-10-CM

## 2023-12-28 MED ORDER — LAMOTRIGINE 100 MG PO TABS: 200.0000 mg | ORAL_TABLET | Freq: Every day | ORAL | 0 refills | Status: DC

## 2023-12-28 MED ORDER — TRAZODONE HCL 100 MG PO TABS: 100.0000 mg | ORAL_TABLET | Freq: Every day | ORAL | 0 refills | Status: AC

## 2023-12-28 MED ORDER — VORTIOXETINE HBR 20 MG PO TABS: 20.0000 mg | ORAL_TABLET | Freq: Every day | ORAL | 2 refills | Status: DC

## 2023-12-28 MED ORDER — GABAPENTIN 300 MG PO CAPS: 300.0000 mg | ORAL_CAPSULE | Freq: Four times a day (QID) | ORAL | 2 refills | Status: AC

## 2023-12-28 NOTE — Telephone Encounter (Signed)
 Pharmacy Patient Advocate Encounter   Received notification from Patient Advice Request messages that prior authorization for Wegovy  is required/requested.   Insurance verification completed.   The patient is insured through [facility] MEDICAID .   Per test claim: PA required; PA submitted to above mentioned insurance via Latent Key/confirmation #/EOC [id number] Status is pending

## 2023-12-29 ENCOUNTER — Other Ambulatory Visit (HOSPITAL_COMMUNITY): Payer: Self-pay

## 2023-12-29 NOTE — Telephone Encounter (Signed)
 Pharmacy Patient Advocate Encounter  Received notification from [facility] MEDICAID that Prior Authorization for Wegovy  has been APPROVED from 12/28/2023 to 12/27/2024. Ran test claim, Copay is $4.00. This test claim was processed through [facility]- copay amounts may vary at other pharmacies due to pharmacy/plan contracts, or as the patient moves through the different stages of their insurance plan.   PA #/Case ID/Reference #: [id number]

## 2024-01-03 ENCOUNTER — Other Ambulatory Visit (HOSPITAL_COMMUNITY)
Admission: RE | Admit: 2024-01-03 | Discharge: 2024-01-03 | Disposition: A | Discharge status: Home / Self Care | Source: Ambulatory Visit | Attending: Gastroenterology | Admitting: Gastroenterology

## 2024-01-03 ENCOUNTER — Other Ambulatory Visit (HOSPITAL_COMMUNITY)
Admission: RE | Admit: 2024-01-03 | Discharge: 2024-01-03 | Disposition: A | Discharge status: Home / Self Care | Payer: Self-pay | Source: Ambulatory Visit | Attending: Medical Genetics | Admitting: Medical Genetics

## 2024-01-03 DIAGNOSIS — K921 Melena: Secondary | ICD-10-CM | POA: Insufficient documentation

## 2024-01-03 LAB — PREGNANCY, URINE: Preg Test, Ur: NEGATIVE

## 2024-01-05 ENCOUNTER — Ambulatory Visit (HOSPITAL_COMMUNITY): Admitting: Certified Registered Nurse Anesthetist

## 2024-01-05 ENCOUNTER — Ambulatory Visit (HOSPITAL_BASED_OUTPATIENT_CLINIC_OR_DEPARTMENT_OTHER): Admitting: Certified Registered Nurse Anesthetist

## 2024-01-05 ENCOUNTER — Other Ambulatory Visit: Payer: Self-pay

## 2024-01-05 ENCOUNTER — Encounter (HOSPITAL_COMMUNITY)
Admission: RE | Disposition: A | Discharge status: Home / Self Care | Payer: Self-pay | Source: Home / Self Care | Attending: Gastroenterology

## 2024-01-05 ENCOUNTER — Ambulatory Visit (HOSPITAL_COMMUNITY)
Admission: RE | Admit: 2024-01-05 | Discharge: 2024-01-05 | Disposition: A | Discharge status: Home / Self Care | Attending: Gastroenterology | Admitting: Gastroenterology

## 2024-01-05 ENCOUNTER — Encounter (HOSPITAL_COMMUNITY): Payer: Self-pay | Admitting: Gastroenterology

## 2024-01-05 DIAGNOSIS — K648 Other hemorrhoids: Secondary | ICD-10-CM

## 2024-01-05 DIAGNOSIS — I10 Essential (primary) hypertension: Secondary | ICD-10-CM | POA: Insufficient documentation

## 2024-01-05 DIAGNOSIS — K5730 Diverticulosis of large intestine without perforation or abscess without bleeding: Secondary | ICD-10-CM | POA: Diagnosis not present

## 2024-01-05 DIAGNOSIS — G4730 Sleep apnea, unspecified: Secondary | ICD-10-CM | POA: Insufficient documentation

## 2024-01-05 DIAGNOSIS — F319 Bipolar disorder, unspecified: Secondary | ICD-10-CM | POA: Diagnosis not present

## 2024-01-05 DIAGNOSIS — Z8249 Family history of ischemic heart disease and other diseases of the circulatory system: Secondary | ICD-10-CM | POA: Insufficient documentation

## 2024-01-05 DIAGNOSIS — Z800 Family history of malignant neoplasm of digestive organs: Secondary | ICD-10-CM | POA: Diagnosis not present

## 2024-01-05 DIAGNOSIS — Z5982 Transportation insecurity: Secondary | ICD-10-CM | POA: Insufficient documentation

## 2024-01-05 DIAGNOSIS — N289 Disorder of kidney and ureter, unspecified: Secondary | ICD-10-CM | POA: Diagnosis not present

## 2024-01-05 DIAGNOSIS — Z5986 Financial insecurity: Secondary | ICD-10-CM | POA: Diagnosis not present

## 2024-01-05 DIAGNOSIS — K640 First degree hemorrhoids: Secondary | ICD-10-CM

## 2024-01-05 DIAGNOSIS — K921 Melena: Secondary | ICD-10-CM | POA: Diagnosis not present

## 2024-01-05 DIAGNOSIS — K581 Irritable bowel syndrome with constipation: Secondary | ICD-10-CM | POA: Diagnosis not present

## 2024-01-05 HISTORY — PX: COLONOSCOPY: SHX5424

## 2024-01-05 LAB — HM COLONOSCOPY

## 2024-01-05 SURGERY — COLONOSCOPY
Anesthesia: General

## 2024-01-05 MED ORDER — PROPOFOL 500 MG/50ML IV EMUL
INTRAVENOUS | Status: DC | PRN
  Administered 2024-01-05: 100 mg via INTRAVENOUS
  Administered 2024-01-05: 150 ug/kg/min via INTRAVENOUS

## 2024-01-05 MED ORDER — LACTATED RINGERS IV SOLN: INTRAVENOUS | Status: DC

## 2024-01-05 MED ORDER — LACTATED RINGERS IV SOLN: INTRAVENOUS | Status: DC | PRN

## 2024-01-05 MED ORDER — PROPOFOL 500 MG/50ML IV EMUL
INTRAVENOUS | Status: AC
  Filled 2024-01-05: qty 50

## 2024-01-05 NOTE — Discharge Instructions (Signed)

## 2024-01-05 NOTE — Transfer of Care (Signed)
 Immediate Anesthesia Transfer of Care Note  Patient: [name]  Procedure(s) Performed: COLONOSCOPY  Patient Location: Endoscopy Unit  Anesthesia Type:General  Level of Consciousness: awake  Airway & Oxygen Therapy: Patient Spontanous Breathing  Post-op Assessment: Report given to RN and Post -op Vital signs reviewed and stable  Post vital signs: Reviewed and stable   Last Vitals:  Vitals Value Taken Time  BP 101/69 01/05/24 10:34  Temp 36.9 C 01/05/24 10:31  Pulse 89 01/05/24 10:31  Resp 20 01/05/24 10:34  SpO2 100 % 01/05/24 10:34    Last Pain:  Vitals:   01/05/24 1034  TempSrc:   PainSc: 0-No pain      Patients Stated Pain Goal: 5 (01/05/24 0956)  Complications: No notable events documented.

## 2024-01-05 NOTE — Op Note (Signed)
 [facility] Patient Name: [name] Procedure Date: 01/05/2024 10:02 AM MRN: [id number] Date of Birth: 1986-01-09 Attending MD: Deatrice Dine , MD, [id number] CSN: [id number] Age: 38 Admit Type: Outpatient Procedure:                Colonoscopy Indications:              Hematochezia Providers:                Deatrice Dine, MD, Leandrew Edelman RN, RN, Jon Loge Referring MD:              Medicines:                Monitored Anesthesia Care Complications:            No immediate complications. Estimated Blood Loss:     Estimated blood loss: none. Procedure:                Pre-Anesthesia Assessment:                           - Prior to the procedure, a History and Physical                            was performed, and patient medications and                            allergies were reviewed. The patient's tolerance of                            previous anesthesia was also reviewed. The risks                            and benefits of the procedure and the sedation                            options and risks were discussed with the patient.                            All questions were answered, and informed consent                            was obtained. Prior Anticoagulants: The patient has                            taken no anticoagulant or antiplatelet agents. ASA                            Grade Assessment: II - A patient with mild systemic                            disease. After reviewing the risks and benefits,                            the  patient was deemed in satisfactory condition to                            undergo the procedure.                           After obtaining informed consent, the colonoscope                            was passed under direct vision. Throughout the                            procedure, the patient's blood pressure, pulse, and                            oxygen saturations were monitored continuously.  The                            CH-HQ190L ([id number]) Colon was introduced through                            the anus and advanced to the the terminal ileum.                            The colonoscopy was performed without difficulty.                            The patient tolerated the procedure well. The                            quality of the bowel preparation was evaluated                            using the BBPS [facility] Bowel Preparation Scale)                            with scores of: Right Colon = 3, Transverse Colon =                            3 and Left Colon = 3 (entire mucosa seen well with                            no residual staining, small fragments of stool or                            opaque liquid). The total BBPS score equals 9. The                            terminal ileum, ileocecal valve, appendiceal                            orifice, and rectum were photographed. Scope In: 10:14:34 AM Scope Out: 10:28:37 AM Scope Withdrawal Time: 0 hours 9 minutes  18 seconds  Total Procedure Duration: 0 hours 14 minutes 3 seconds  Findings:      Scattered medium-mouthed diverticula were found in the right colon.      Non-bleeding internal hemorrhoids were found during retroflexion. The       hemorrhoids were small.      The terminal ileum appeared normal. Impression:               - Diverticulosis in the right colon.                           - Non-bleeding internal hemorrhoids.                           - The examined portion of the ileum was normal.                           - No specimens collected. Moderate Sedation:      Per Anesthesia Care Recommendation:           - Patient has a contact number available for                            emergencies. The signs and symptoms of potential                            delayed complications were discussed with the                            patient. Return to normal activities tomorrow.                            Written  discharge instructions were provided to the                            patient.                           - Resume previous diet.                           - Repeat colonoscopy at age 62 for screening                            purposes.                           - Return to primary care physician as previously                            scheduled.                           -Continue with Linzess  as patient had significant                            improvement in abdominal pain and constipation:  [id number] Procedure Code(s):        --- Professional ---                           [phone], Colonoscopy, flexible; diagnostic, including                            collection of specimen(s) by brushing or washing,                            when performed (separate procedure) Diagnosis Code(s):        --- Professional ---                           K64.8, Other hemorrhoids                           K92.1, Melena (includes Hematochezia)                           K57.30, Diverticulosis of large intestine without                            perforation or abscess without bleeding CPT copyright 2022 American Medical Association. All rights reserved. The codes documented in this report are preliminary and upon coder review may  be revised to meet current compliance requirements. Deatrice Dine, MD Deatrice Dine, MD 01/05/2024 10:36:40 AM This report has been signed electronically. Number of Addenda: 0

## 2024-01-05 NOTE — Anesthesia Preprocedure Evaluation (Signed)
 Anesthesia Evaluation  Patient identified by MRN, date of birth, ID band Patient awake    Reviewed: Allergy & Precautions, H&P , NPO status , Patient's Chart, lab work & pertinent test results, reviewed documented beta blocker date and time   Airway Mallampati: II  TM Distance: >3 FB Neck ROM: full    Dental no notable dental hx.    Pulmonary neg pulmonary ROS, sleep apnea    Pulmonary exam normal breath sounds clear to auscultation       Cardiovascular Exercise Tolerance: Good hypertension, negative cardio ROS  Rhythm:regular Rate:Normal     Neuro/Psych  Headaches PSYCHIATRIC DISORDERS  Depression Bipolar Disorder   negative neurological ROS  negative psych ROS   GI/Hepatic negative GI ROS, Neg liver ROS,,,  Endo/Other  negative endocrine ROS    Renal/GU Renal diseasenegative Renal ROS  negative genitourinary   Musculoskeletal   Abdominal   Peds  Hematology negative hematology ROS (+)   Anesthesia Other Findings   Reproductive/Obstetrics negative OB ROS                              Anesthesia Physical Anesthesia Plan  ASA: 2  Anesthesia Plan: General   Post-op Pain Management:    Induction:   PONV Risk Score and Plan: Propofol  infusion  Airway Management Planned:   Additional Equipment:   Intra-op Plan:   Post-operative Plan:   Informed Consent: I have reviewed the patients History and Physical, chart, labs and discussed the procedure including the risks, benefits and alternatives for the proposed anesthesia with the patient or authorized representative who has indicated his/her understanding and acceptance.     Dental Advisory Given  Plan Discussed with: CRNA  Anesthesia Plan Comments:         Anesthesia Quick Evaluation

## 2024-01-05 NOTE — H&P (Signed)
 Primary Care Physician:  Severa Rock HERO, FNP Primary Gastroenterologist:  Dr. Cinderella  Pre-Procedure History & Physical: HPI:  [name] is a 38 y.o. female with history of kidney stones who presents for evaluation of with abdominal pain and hematochezia.   Patient reports that she has been suffering from constipation for most of her life.  She would have 1 bowel movement every 1 to 2 weeks which would be initially solid and hard followed by a lot of diarrhea.  This is accompanied with abdominal discomfort relieved with defecation   Patient reports protrusion from her anus which she thinks is external hemorrhoids and recently with bleeding.    Patient has tried Metamucil and MiraLAX  but without much relief   The patient denies having any nausea, vomiting, fever, chills,melena, hematemesis, diarrhea, jaundice, pruritus or weight loss.   Labs from 10/2023 normal liver enzymes hemoglobin 14.3 Hepatitis B surface antibody negative Hepatitis C negative   Last ZHI:wnwz Last Colonoscopy:none   FHx: Mother had colon polyps, grandmother colon cancer Social: neg smoking, alcohol  or illicit drug use Surgical: Appendectomy    Past Medical History:  Diagnosis Date   Borderline personality disorder (HCC)    Headache    Hyperlipidemia 09/25/2021   Hypertension in pregnancy    Knee pain    Nephrolithiasis    Postpartum depression    Preeclampsia    Sleep apnea     Past Surgical History:  Procedure Laterality Date   APPENDECTOMY     CESAREAN SECTION     CYSTOSCOPY/URETEROSCOPY/HOLMIUM LASER/STENT PLACEMENT Right 04/16/2021   Procedure: CYSTOSCOPY RIGHT RETROGRADE PYELOGRAM  URETEROSCOPY/HOLMIUM LASER/STENT PLACEMENT;  Surgeon: Watt Rush, MD;  Location: [facility] [place];  Service: Urology;  Laterality: Right;   HOLMIUM LASER APPLICATION Right 04/16/2021   Procedure: HOLMIUM LASER APPLICATION;  Surgeon: Watt Rush, MD;  Location: [facility];  Service:  Urology;  Laterality: Right;   KIDNEY STONE SURGERY  04/16/2021   r-kidney   KNEE SURGERY Right    scoped x2   MEDIAL PATELLOFEMORAL LIGAMENT REPAIR Right 05/04/2023   Procedure: MEDIAL PATELLA FEMORAL LIGAMENT RECONSTRUCTION WITH OSTEOCHONDRAL AUTOGRAFT;  Surgeon: Cristy Bonner DASEN, MD;  Location: [facility];  Service: Orthopedics;  Laterality: Right;   WISDOM TOOTH EXTRACTION  2010    Prior to Admission medications   Medication Sig Start Date End Date Taking? Authorizing Provider  candesartan  (ATACAND ) 8 MG tablet Take 1 tablet (8 mg total) by mouth daily. 06/08/23  Yes Ines Onetha NOVAK, MD  clindamycin (CLEOCIN T) 1 % SWAB Apply topically 2 (two) times daily.   Yes [provider]  doxycycline  (VIBRAMYCIN ) 100 MG capsule Take 100 mg by mouth daily. 09/02/23  Yes [provider]  Fremanezumab -vfrm (AJOVY ) 225 MG/1.5ML SOAJ Inject 225 mg into the skin every 30 (thirty) days. 06/09/23  Yes Ines Onetha NOVAK, MD  gabapentin  (NEURONTIN ) 300 MG capsule Take 1 capsule (300 mg total) by mouth 4 (four) times daily. 12/28/23  Yes Carvin Arvella HERO, MD  HYDROcodone -acetaminophen  (NORCO/VICODIN) 5-325 MG tablet Take 1 tablet by mouth every 6 (six) hours as needed. 09/27/23  Yes Zammit, Joseph, MD  hydrocortisone  (ANUSOL -HC) 25 MG suppository Place 1 suppository (25 mg total) rectally at bedtime. 11/15/23  Yes Monita Swier, Deatrice FALCON, MD  lamoTRIgine  (LAMICTAL ) 100 MG tablet Take 2 tablets (200 mg total) by mouth daily. 12/28/23 03/27/24 Yes Carvin Arvella HERO, MD  linaclotide  (LINZESS ) 145 MCG CAPS capsule Take 1 capsule (145 mcg total) by mouth daily. 11/15/23  05/13/24 Yes Mahi Zabriskie, Deatrice FALCON, MD  ondansetron  (ZOFRAN -ODT) 4 MG disintegrating tablet 4mg  ODT q4 hours prn nausea/vomit 11/03/23  Yes Rakes, Rock HERO, FNP  polyethylene glycol (MIRALAX  / GLYCOLAX ) 17 g packet Take 17 g by mouth 2 (two) times daily. 11/15/23 02/13/24 Yes Sharnell Knight, Deatrice FALCON, MD  propranolol  (INDERAL ) 10 MG tablet Take 1 tablet (10  mg total) by mouth 3 (three) times daily as needed. 11/21/23  Yes Carvin Arvella HERO, MD  psyllium (METAMUCIL) 58.6 % packet Take 1 packet by mouth 2 (two) times daily. 11/15/23 02/13/24 Yes Josslynn Mentzer, Deatrice FALCON, MD  rizatriptan  (MAXALT -MLT) 10 MG disintegrating tablet Take 1 tablet (10 mg total) by mouth as needed for migraine. May repeat in 2 hours if needed 09/11/23  Yes Ines Onetha NOVAK, MD  rosuvastatin  (CRESTOR ) 40 MG tablet Take 1 tablet (40 mg total) by mouth daily. 01/20/23  Yes Ines Onetha NOVAK, MD  traZODone  (DESYREL ) 100 MG tablet Take 1 tablet (100 mg total) by mouth at bedtime. 12/28/23  Yes Carvin Arvella HERO, MD  Vitamin D , Ergocalciferol , (DRISDOL ) 1.25 MG (50000 UNIT) CAPS capsule Take 1 capsule (50,000 Units total) by mouth every 7 (seven) days. 09/15/23  Yes Rakes, Rock HERO, FNP  vortioxetine  HBr (TRINTELLIX ) 20 MG TABS tablet Take 1 tablet (20 mg total) by mouth daily. 12/28/23  Yes Carvin Arvella HERO, MD  Semaglutide -Weight Management (WEGOVY ) 2.4 MG/0.75ML SOAJ Inject 2.4 mg into the skin once a week. 09/26/23   Ines Onetha NOVAK, MD    Allergies as of 11/21/2023 - Review Complete 11/21/2023  Allergen Reaction Noted   Hydroquinone Anaphylaxis, Shortness Of Breath, and Swelling 06/05/2019   Sodium hypochlorite Shortness Of Breath 10/15/2015   Keflex [cephalexin] Rash 09/17/2015   Latex Rash 09/17/2015    Family History  Problem Relation Age of Onset   Migraines Mother    Hypertension Mother    COPD Mother    Healthy Father    Migraines Maternal Grandmother    Stroke Maternal Grandmother    Diabetes Maternal Grandfather    Hypertension Maternal Grandfather     Social History   Socioeconomic History   Marital status: Significant Other    Spouse name: Not on file   Number of children: Not on file   Years of education: Not on file   Highest education level: Some college, no degree  Occupational History   Not on file  Tobacco Use   Smoking status: Never   Smokeless tobacco: Never   Vaping Use   Vaping status: Never Used  Substance and Sexual Activity   Alcohol  use: No   Drug use: Not Currently   Sexual activity: Yes    Birth control/protection: None  Other Topics Concern   Not on file  Social History Narrative   Caffiene 300mg  daily   Working : delivery service    Social Drivers of Health   Financial Resource Strain: High Risk (11/03/2023)   Overall Financial Resource Strain (CARDIA)    Difficulty of Paying Living Expenses: Hard  Food Insecurity: Food Insecurity Present (11/03/2023)   Hunger Vital Sign    Worried About Running Out of Food in the Last Year: Sometimes true    Ran Out of Food in the Last Year: Sometimes true  Transportation Needs: Unmet Transportation Needs (11/03/2023)   PRAPARE - Transportation    Lack of Transportation (Medical): Yes    Lack of Transportation (Non-Medical): Yes  Physical Activity: Insufficiently Active (11/03/2023)   Exercise Vital Sign    Days of  Exercise per Week: 4 days    Minutes of Exercise per Session: 20 min  Stress: Stress Concern Present (11/03/2023)   [employer] of Occupational Health - Occupational Stress Questionnaire    Feeling of Stress: To some extent  Social Connections: Moderately Integrated (11/03/2023)   Social Connection and Isolation Panel    Frequency of Communication with Friends and Family: More than three times a week    Frequency of Social Gatherings with Friends and Family: More than three times a week    Attends Religious Services: Patient declined    Database administrator or Organizations: Yes    Attends Engineer, structural: More than 4 times per year    Marital Status: Living with partner  Intimate Partner Violence: Not on file    Review of Systems: See HPI, otherwise negative ROS  Physical Exam: Vital signs in last 24 hours:     General:   Alert,  Well-developed, well-nourished, pleasant and cooperative in NAD Head:  Normocephalic and atraumatic. Eyes:  Sclera  clear, no icterus.   Conjunctiva pink. Ears:  Normal auditory acuity. Nose:  No deformity, discharge,  or lesions. Msk:  Symmetrical without gross deformities. Normal posture. Extremities:  Without clubbing or edema. Neurologic:  Alert and  oriented x4;  grossly normal neurologically. Skin:  Intact without significant lesions or rashes. Psych:  Alert and cooperative. Normal mood and affect.  Impression/Plan: AMESHA BAILEY is a 38 y.o. female with history of kidney stones who presents for evaluation of with abdominal pain and hematochezia.   Proceed with colonoscopy   The risks of the procedure including infection, bleed, or perforation as well as benefits, limitations, alternatives and imponderables have been reviewed with the patient. Questions have been answered. All parties agreeable.

## 2024-01-05 NOTE — Anesthesia Postprocedure Evaluation (Signed)
 Anesthesia Post Note  Patient: [name]  Procedure(s) Performed: COLONOSCOPY  Patient location during evaluation: Phase II Anesthesia Type: General Level of consciousness: awake Pain management: pain level controlled Vital Signs Assessment: post-procedure vital signs reviewed and stable Respiratory status: spontaneous breathing and respiratory function stable Cardiovascular status: blood pressure returned to baseline and stable Postop Assessment: no headache and no apparent nausea or vomiting Anesthetic complications: no Comments: Late entry   No notable events documented.   Last Vitals:  Vitals:   01/05/24 1031 01/05/24 1034  BP: (!) 88/56 101/69  Pulse: 89   Resp: (!) 23 20  Temp: 36.9 C   SpO2: 99% 100%    Last Pain:  Vitals:   01/05/24 1034  TempSrc:   PainSc: 0-No pain                 Yvonna JINNY Bosworth

## 2024-01-08 ENCOUNTER — Encounter (HOSPITAL_COMMUNITY): Payer: Self-pay | Admitting: Gastroenterology

## 2024-01-08 ENCOUNTER — Encounter (INDEPENDENT_AMBULATORY_CARE_PROVIDER_SITE_OTHER): Payer: Self-pay | Admitting: *Deleted

## 2024-01-12 ENCOUNTER — Other Ambulatory Visit (HOSPITAL_COMMUNITY): Payer: Self-pay | Admitting: *Deleted

## 2024-01-12 LAB — GENECONNECT MOLECULAR SCREEN: Genetic Analysis Overall Interpretation: NEGATIVE

## 2024-01-12 NOTE — Patient Instructions (Addendum)
 SURGICAL WAITING ROOM VISITATION Patients having surgery or a procedure may have no more than 2 support people in the waiting area - these visitors may rotate in the visitor waiting room.   Due to an increase in RSV and influenza rates and associated hospitalizations, children ages 84 and under may not visit patients in [facility] hospitals. If the patient needs to stay at the hospital during part of their recovery, the visitor guidelines for inpatient rooms apply.  PRE-OP VISITATION  Pre-op nurse will coordinate an appropriate time for 1 support person to accompany the patient in pre-op.  This support person may not rotate.  This visitor will be contacted when the time is appropriate for the visitor to come back in the pre-op area.  Please refer to the [facility] website for the visitor guidelines for Inpatients (after your surgery is over and you are in a regular room).  You are not required to quarantine at this time prior to your surgery. However, you must do this: Hand Hygiene often Do NOT share personal items Notify your provider if you are in close contact with someone who has COVID or you develop fever 100.4 or greater, new onset of sneezing, cough, sore throat, shortness of breath or body aches.  If you test positive for Covid or have been in contact with anyone that has tested positive in the last 10 days please notify you surgeon.    Your procedure is scheduled on:  01/23/24  Report to [facility] Main Entrance: [place] entrance where the [employer] is available.   Report to admitting at: 6:45 AM  Call this number if you have any questions or problems the morning of surgery [phone]  FOLLOW ANY ADDITIONAL PRE OP INSTRUCTIONS YOU RECEIVED FROM YOUR SURGEON'S OFFICE!!!  Do not eat food or drink fluids after Midnight the night prior to your surgery/procedure.   Oral Hygiene is also important to reduce your risk of infection.        Remember - BRUSH YOUR TEETH  THE MORNING OF SURGERY WITH YOUR REGULAR TOOTHPASTE  Do NOT smoke after Midnight the night before surgery.  STOP TAKING all Vitamins, Herbs and supplements 1 week before your surgery.   Take ONLY these medicines the morning of surgery with A SIP OF WATER: gabapentin ,lamotrigine ,propranolol ,linaclotide .Rizatriptan  as needed.   HOLD Semaglutide  after: 01/15/24                  You may not have any metal on your body including hair pins, jewelry, and body piercing  Do not wear make-up, lotions, powders, perfumes / cologne, or deodorant  Do not wear nail polish including gel and S&S, artificial / acrylic nails, or any other type of covering on natural nails including finger and toenails. If you have artificial nails, gel coating, etc., that needs to be removed by a nail salon, Please have this removed prior to surgery. Not doing so may mean that your surgery could be cancelled or delayed if the Surgeon or anesthesia staff feels like they are unable to monitor you safely.   Do not shave 48 hours prior to surgery to avoid nicks in your skin which may contribute to postoperative infections.   Contacts, Hearing Aids, dentures or bridgework may not be worn into surgery. DENTURES WILL BE REMOVED PRIOR TO SURGERY PLEASE DO NOT APPLY Poly grip OR ADHESIVES!!!  You may bring a small overnight bag with you on the day of surgery, only pack items that are not valuable.  [facility] IS NOT RESPONSIBLE   FOR VALUABLES THAT ARE LOST OR STOLEN.   Patients discharged on the day of surgery will not be allowed to drive home.  Someone NEEDS to stay with you for the first 24 hours after anesthesia.  Do not bring your home medications to the hospital. The Pharmacy will dispense medications listed on your medication list to you during your admission in the Hospital.  Special Instructions: Bring a copy of your healthcare power of attorney and living will documents the day of surgery, if you wish to have them scanned  into your [facility] Records- EPIC  Please read over the following fact sheets you were given: IF YOU HAVE QUESTIONS ABOUT YOUR PRE-OP INSTRUCTIONS, PLEASE CALL [phone]  PATIENT SIGNATURE_________________________________  NURSE SIGNATURE__________________________________  ________________________________________________________________________ [facility] - Preparing for Surgery Before surgery, you can play an important role.  Because skin is not sterile, your skin needs to be as free of germs as possible.  You can reduce the number of germs on your skin by washing with CHG (chlorahexidine gluconate) soap before surgery.  CHG is an antiseptic cleaner which kills germs and bonds with the skin to continue killing germs even after washing. Please DO NOT use if you have an allergy to CHG or antibacterial soaps.  If your skin becomes reddened/irritated stop using the CHG and inform your nurse when you arrive at Short Stay. Do not shave (including legs and underarms) for at least 48 hours prior to the first CHG shower.  You may shave your face/neck.  Please follow these instructions carefully:  1.  Shower with CHG Soap the night before surgery ONLY (DO NOT USE THE SOAP THE MORNING OF SURGERY).  2.  If you choose to wash your hair, wash your hair first as usual with your normal  shampoo.  3.  After you shampoo, rinse your hair and body thoroughly to remove the shampoo.                             4.  Use CHG as you would any other liquid soap.  You can apply chg directly to the skin and wash.  Gently with a scrungie or clean washcloth.  5.  Apply the CHG Soap to your body ONLY FROM THE NECK DOWN.   Do   not use on face/ open                           Wound or open sores. Avoid contact with eyes, ears mouth and   genitals (private parts).                       Wash face,  Genitals (private parts) with your normal soap.             6.  Wash thoroughly, paying special attention to the area  where your    surgery  will be performed.  7.  Thoroughly rinse your body with warm water from the neck down.  8.  DO NOT shower/wash with your normal soap after using and rinsing off the CHG Soap.                9.  Pat yourself dry with a clean towel.            10.  Wear clean pajamas.  11.  Place clean sheets on your bed the night of your first shower and do not  sleep with pets. Day of Surgery : Do not apply any CHG, lotions/deodorants the morning of surgery.  Please wear clean clothes to the hospital/surgery center.  FAILURE TO FOLLOW THESE INSTRUCTIONS MAY RESULT IN THE CANCELLATION OF YOUR SURGERY  PATIENT SIGNATURE_________________________________  NURSE SIGNATURE__________________________________  ________________________________________________________________________

## 2024-01-15 ENCOUNTER — Encounter (HOSPITAL_COMMUNITY)
Admission: RE | Admit: 2024-01-15 | Discharge: 2024-01-15 | Disposition: A | Discharge status: Home / Self Care | Source: Ambulatory Visit | Attending: Urology | Admitting: Urology

## 2024-01-15 ENCOUNTER — Other Ambulatory Visit: Payer: Self-pay

## 2024-01-15 ENCOUNTER — Encounter (HOSPITAL_COMMUNITY): Payer: Self-pay

## 2024-01-15 DIAGNOSIS — I10 Essential (primary) hypertension: Secondary | ICD-10-CM | POA: Diagnosis not present

## 2024-01-15 DIAGNOSIS — Z01818 Encounter for other preprocedural examination: Secondary | ICD-10-CM | POA: Diagnosis not present

## 2024-01-15 HISTORY — DX: Anxiety disorder, unspecified: F41.9

## 2024-01-15 HISTORY — DX: Personal history of urinary calculi: Z87.442

## 2024-01-15 HISTORY — DX: Hidradenitis suppurativa: L73.2

## 2024-01-15 LAB — BASIC METABOLIC PANEL WITH GFR
Anion gap: 12 (ref 5–15)
BUN: 6 mg/dL (ref 6–20)
CO2: 23 mmol/L (ref 22–32)
Calcium: 9.9 mg/dL (ref 8.9–10.3)
Chloride: 106 mmol/L (ref 98–111)
Creatinine, Ser: 1 mg/dL (ref 0.44–1.00)
GFR, Estimated: >60 mL/min (ref >60)
Glucose, Bld: 87 mg/dL (ref 70–99)
Potassium: 3.4 mmol/L — ABNORMAL LOW (ref 3.5–5.1)
Sodium: 141 mmol/L (ref 135–145)

## 2024-01-15 LAB — CBC
HCT: 38.4 % (ref 36.0–46.0)
Hemoglobin: 12.1 g/dL (ref 12.0–15.0)
MCH: 30.3 pg (ref 26.0–34.0)
MCHC: 31.5 g/dL (ref 30.0–36.0)
MCV: 96.2 fL (ref 80.0–100.0)
Platelets: 213 K/uL (ref 150–400)
RBC: 3.99 MIL/uL (ref 3.87–5.11)
RDW: 12.3 % (ref 11.5–15.5)
WBC: 4.8 K/uL (ref 4.0–10.5)
nRBC: 0 % (ref 0.0–0.2)

## 2024-01-15 NOTE — Progress Notes (Signed)
 For Anesthesia: PCP - Severa Rock HERO, FNP  Cardiologist - N/A  Bowel Prep reminder:N/A  Chest x-ray -  EKG - 01/15/24 Stress Test -  ECHO -  Cardiac Cath -  Pacemaker/ICD device last checked: Pacemaker orders received: Device Rep notified:  Spinal Cord Stimulator:N/A  Sleep Study - Yes CPAP - Yes  Fasting Blood Sugar - N/A Checks Blood Sugar _____ times a day Date and result of last Hgb A1c-  Last dose of GLP1 agonist- Semaglutide : Last dose: 01/14/24 GLP1 instructions: Hold 7 days prior to schedule (Hold 24 hours-daily)   Last dose of SGLT-2 inhibitors- N/A SGLT-2 instructions: Hold 72 hours prior to surgery  Blood Thinner Instructions:N/A Last Dose: Time last taken:  Aspirin  Instructions:N/A Last Dose: Time last taken:  Activity level: Can go up a flight of stairs and activities of daily living without stopping and without chest pain and/or shortness of breath   Able to exercise without chest pain and/or shortness of breath  Anesthesia review:   Patient denies shortness of breath, fever, cough and chest pain at PAT appointment   Patient verbalized understanding of instructions that were reviewed over the telephone.

## 2024-01-15 NOTE — Patient Instructions (Addendum)
 SURGICAL WAITING ROOM VISITATION Patients having surgery or a procedure may have no more than 2 support people in the waiting area - these visitors may rotate in the visitor waiting room.   Due to an increase in RSV and influenza rates and associated hospitalizations, children ages 32 and under may not visit patients in [facility] hospitals. If the patient needs to stay at the hospital during part of their recovery, the visitor guidelines for inpatient rooms apply.  PRE-OP VISITATION  Pre-op nurse will coordinate an appropriate time for 1 support person to accompany the patient in pre-op.  This support person may not rotate.  This visitor will be contacted when the time is appropriate for the visitor to come back in the pre-op area.  Please refer to the [facility] website for the visitor guidelines for Inpatients (after your surgery is over and you are in a regular room).  You are not required to quarantine at this time prior to your surgery. However, you must do this: Hand Hygiene often Do NOT share personal items Notify your provider if you are in close contact with someone who has COVID or you develop fever 100.4 or greater, new onset of sneezing, cough, sore throat, shortness of breath or body aches.  If you test positive for Covid or have been in contact with anyone that has tested positive in the last 10 days please notify you surgeon.    Your procedure is scheduled on:  01/23/24  Report to [facility] Main Entrance: [address] entrance where the [place] [address] is available  Report to admitting at:6:45 AM  Call this number if you have any questions or problems the morning of surgery [phone]  FOLLOW ANY ADDITIONAL PRE OP INSTRUCTIONS YOU RECEIVED FROM YOUR SURGEON'S OFFICE!!!  Do not eat food or drink fluids after Midnight the night prior to your surgery/procedure.   Oral Hygiene is also important to reduce your risk of infection.        Remember - BRUSH YOUR TEETH THE  MORNING OF SURGERY WITH YOUR REGULAR TOOTHPASTE  Do NOT smoke after Midnight the night before surgery.  STOP TAKING all Vitamins, Herbs and supplements 1 week before your surgery.   Take ONLY these medicines the morning of surgery with A SIP OF WATER: gabapentin ,lamotrigine ,linaclotide .propranolol .rizatriptan  as need.  If You have been diagnosed with Sleep Apnea - Bring CPAP mask and tubing day of surgery. We will provide you with a CPAP machine on the day of your surgery.                   You may not have any metal on your body including hair pins, jewelry, and body piercing  Do not wear make-up, lotions, powders, perfumes / cologne, or deodorant  Do not wear nail polish including gel and S&S, artificial / acrylic nails, or any other type of covering on natural nails including finger and toenails. If you have artificial nails, gel coating, etc., that needs to be removed by a nail salon, Please have this removed prior to surgery. Not doing so may mean that your surgery could be cancelled or delayed if the Surgeon or anesthesia staff feels like they are unable to monitor you safely.   Do not shave 48 hours prior to surgery to avoid nicks in your skin which may contribute to postoperative infections.   Contacts, Hearing Aids, dentures or bridgework may not be worn into surgery. DENTURES WILL BE REMOVED PRIOR TO SURGERY PLEASE DO NOT APPLY Poly  grip OR ADHESIVES!!!  You may bring a small overnight bag with you on the day of surgery, only pack items that are not valuable. [facility] IS NOT RESPONSIBLE   FOR VALUABLES THAT ARE LOST OR STOLEN.   Patients discharged on the day of surgery will not be allowed to drive home.  Someone NEEDS to stay with you for the first 24 hours after anesthesia.  Do not bring your home medications to the hospital. The Pharmacy will dispense medications listed on your medication list to you during your admission in the Hospital.  Special Instructions: Bring a  copy of your healthcare power of attorney and living will documents the day of surgery, if you wish to have them scanned into your [facility] Records- EPIC  Please read over the following fact sheets you were given: IF YOU HAVE QUESTIONS ABOUT YOUR PRE-OP INSTRUCTIONS, PLEASE CALL [phone]  [facility] - Preparing for Surgery Before surgery, you can play an important role.  Because skin is not sterile, your skin needs to be as free of germs as possible.  You can reduce the number of germs on your skin by washing with CHG (chlorahexidine gluconate) soap before surgery.  CHG is an antiseptic cleaner which kills germs and bonds with the skin to continue killing germs even after washing. Please DO NOT use if you have an allergy to CHG or antibacterial soaps.  If your skin becomes reddened/irritated stop using the CHG and inform your nurse when you arrive at Short Stay. Do not shave (including legs and underarms) for at least 48 hours prior to the first CHG shower.  You may shave your face/neck.  Please follow these instructions carefully:  1.  Shower with CHG Soap the night before surgery ONLY (DO NOT USE THE SOAP THE MORNING OF SURGERY).  2.  If you choose to wash your hair, wash your hair first as usual with your normal  shampoo.  3.  After you shampoo, rinse your hair and body thoroughly to remove the shampoo.                             4.  Use CHG as you would any other liquid soap.  You can apply chg directly to the skin and wash.  Gently with a scrungie or clean washcloth.  5.  Apply the CHG Soap to your body ONLY FROM THE NECK DOWN.   Do   not use on face/ open                           Wound or open sores. Avoid contact with eyes, ears mouth and   genitals (private parts).                       Wash face,  Genitals (private parts) with your normal soap.             6.  Wash thoroughly, paying special attention to the area where your    surgery  will be performed.  7.  Thoroughly  rinse your body with warm water from the neck down.  8.  DO NOT shower/wash with your normal soap after using and rinsing off the CHG Soap.                9.  Pat yourself dry with a clean towel.  10.  Wear clean pajamas.            11.  Place clean sheets on your bed the night of your first shower and do not  sleep with pets. Day of Surgery : Do not apply any CHG, lotions/deodorants the morning of surgery.  Please wear clean clothes to the hospital/surgery center.  FAILURE TO FOLLOW THESE INSTRUCTIONS MAY RESULT IN THE CANCELLATION OF YOUR SURGERY  PATIENT SIGNATURE_________________________________  NURSE SIGNATURE__________________________________  ________________________________________________________________________

## 2024-01-16 DIAGNOSIS — N200 Calculus of kidney: Secondary | ICD-10-CM | POA: Diagnosis not present

## 2024-01-21 NOTE — H&P (Signed)
 I have ureteral stone.  HPI: [name] is a 38 year-old female established patient who is here for ureteral stone.  [name] is a 38 yo female who had the onset of symptoms a few weeks ago with pain in the right lower back. She was in the ER on Monday and was found to have a 6 mm right distal stone with some obstruction and a 2 mm right renal stone. She has passed a stone about 6 months ago and she has had episodes about every 6-8 months for 2 years. She is on tamsulosin an pain meds. She has had hematuria and has some urgency.   03/17/2021: 38 year old female who presents today for follow-up of a 6 mm right distal ureteral stone causing mild obstruction as well as a 2 mm right renal stone. She has not seen a stone pass. She continues to have pain and discomfort. She denies fevers but endorses urinary frequency and urgency as well as right flank pain and discomfort that is intermittent.   04/23/2021: 38 year old female who presents today 7 days postop of a right sided ureteroscopy. She had quite a bit of inflammation and therefore stent will be left in place for 14 days. She reports that over the last 2 to 3 days she has had fevers of 100.6 and severe dysuria this is also associated with suprapubic pain and pressure and general fatigue and nausea. She has been taking ibuprofen  without relief of symptoms. Her urinalysis does have moderate bacteria today.   04/27/2021: Treated for UTI at last office visit, urine culture ultimately grew strep viridans. She received IM gentamicin  as well as p.o. Bactrim . Patient was prescribed pain medication and Zofran  at that time as well. Now back today for follow-up visit with planned stent removal. Doing much better today, she continues Bactrim  as previously prescribed. Still with continued frequency/urgency, some dysuria and intermittent hematuria but much more manageable than before. Pain also much more significantly controlled. She is not had interval fevers or chills,  nausea/vomiting.   05/28/2021: 38 year old female who presents today for follow-up renal ultrasound and to discuss stone prevention strategies. She is doing very well and denies any interval flank pain, gross hematuria, dysuria, urgency or frequency. Stone composition was calcium  oxalate. Renal ultrasound is normal.   11/28/2023: [name] is a 38 year old female who presents for follow-up of bilateral nephrolithiasis. For the past 3 months she has had right sided flank pain that comes and goes and is described as sharp. She went to the emergency department in June because the pain caused her to develop nausea, vomiting and she was unable to leave her bed. She is currently on her menstrual cycle and she does have blood in her urine today. Nothing makes the pain better. Drinking water makes the pain worse. Renal ultrasound is without obstruction.     ALLERGIES: Bleach Keflex Latex    MEDICATIONS: Ajovy   Candesartan  Cilexetil  Clindamycin HCl  Doxycycline   Gabapentin   HYDROcodone -Acetaminophen   Hydrocortisone   lamoTRIgine   Linzess  145 MCG Capsule  Meloxicam 7.5 MG Tablet 1 tablet PO Daily  Ondansetron   Polyethylene Glycol  Propranolol  HCl 10 MG Tablet  Psyllium Husk  Rizatriptan  Benzoate  Rosuvastatin  Calcium  40 MG Tablet  traZODone  HCl 100 MG Tablet  Trintellix  10 MG Tablet  Vitamin D   Wegovy      GU PSH: Cysto Remove Stent FB Sim - 2023 Ureteroscopic laser litho - 2023       PSH Notes: Knee surgery- 2003, tube ties     NON-GU PSH:  Appendectomy     GU PMH: Renal calculus - 2023, - 2023, - 2022, She has a tiny right upper pole stone that could be addressed if she requires ureteroscopy. , - 2022 Ureteral calculus - 2023, - 2023, - 2023, - 2022, She has a 4x50mm right distal stone. I have recommended a trial of MET and have refilled the tamsulosin and hydrocodone . She will return in 2 weeks for a KUB but will come to the [facility] ED if her symptoms worsen. I briefly discussed ESWL and  URS, - 2022 Acute Cystitis/UTI - 2023    NON-GU PMH: Bacteriuria, I will get a culture for completeness. - 2022 Asthma Depression    FAMILY HISTORY: 2 daughters - Other Blood In Urine - Mother kidney - Mother Kidney Failure - Aunt, Other Kidney Stones - Runs in Family Prostate Cancer - Runs in Family   SOCIAL HISTORY: Marital Status: Married Preferred Language: English; Ethnicity: Not Hispanic Or Latino; Race: White Does not use smokeless tobacco. Does not use drugs. Drinks 4+ caffeinated drinks per day. Patient's occupation is/was [employer].    REVIEW OF SYSTEMS:    GU Review Female:   Patient denies frequent urination, hard to postpone urination, burning /pain with urination, get up at night to urinate, leakage of urine, stream starts and stops, trouble starting your stream, have to strain to urinate, and being pregnant.  Gastrointestinal (Upper):   Patient denies nausea, vomiting, and indigestion/ heartburn.  Gastrointestinal (Lower):   Patient denies constipation and diarrhea.  Constitutional:   Patient denies fever, night sweats, weight loss, and fatigue.  Skin:   Patient denies skin rash/ lesion and itching.  Eyes:   Patient denies blurred vision and double vision.  Musculoskeletal:   Patient denies back pain and joint pain.  Neurological:   Patient denies headaches and dizziness.  Psychologic:   Patient denies depression and anxiety.   VITAL SIGNS:      11/28/2023 11:50 AM  Weight 210 lb / 95.25 kg  Height 66 in / 167.64 cm  BP 103/73 mmHg  Heart Rate 70 /min  Temperature 97.3 F / 36.2 C  BMI 33.9 kg/m   GU PHYSICAL EXAMINATION:      Notes: Right CVA tenderness.    MULTI-SYSTEM PHYSICAL EXAMINATION:    Constitutional: Well-nourished. No physical deformities. Normally developed. Good grooming.  Cardiovascular: Normal temperature, normal extremity pulses, no swelling, no varicosities.  Skin: No paleness, no jaundice, no cyanosis. No lesion, no ulcer, no  rash.  Neurologic / Psychiatric: Oriented to time, oriented to place, oriented to person. No depression, no anxiety, no agitation.  Gastrointestinal: No mass, no tenderness, no rigidity, non obese abdomen.     Complexity of Data:  Source Of History:  Patient  Records Review:   Previous Doctor Records, Previous Patient Records  Urine Test Review:   Urinalysis   11/28/23  Urinalysis  Urine Appearance Slightly Cloudy   Urine Color Amber   Urine Glucose Neg mg/dL  Urine Bilirubin Neg mg/dL  Urine Ketones Neg mg/dL  Urine Specific Gravity 1.025   Urine Blood 3+ ery/uL  Urine pH 6.0   Urine Protein 2+ mg/dL  Urine Urobilinogen 1.0 mg/dL  Urine Nitrites Neg   Urine Leukocyte Esterase Neg leu/uL  Urine WBC/hpf NS (Not Seen)   Urine RBC/hpf 20 - 40/hpf   Urine Epithelial Cells 6 - 10/hpf   Urine Bacteria Mod (26-50/hpf)   Urine Mucous Present   Urine Yeast NS (Not Seen)   Urine Trichomonas Not  Present   Urine Cystals Ca Oxalate   Urine Casts NS (Not Seen)   Urine Sperm Not Present    PROCEDURES:         Renal Ultrasound (Limited) - 23224  Kidney: Right  Length: 11.16 cm Depth: 5.11 cm Cortical Width: 1.52 cm Width: 5.30cm    Right Kidney/Ureter:  small non obstructive calcs noted  Bladder:  pvr 17ml      . Patient confirmed No Neulasta OnPro Device.            KUB - [id number]  A single view of the abdomen is obtained. 3.5 mm opacity in right renal shadow. No other opacities noted      . Patient confirmed No Neulasta OnPro Device.           Visit Complexity - G2211          Urinalysis w/Scope - 81001 Dipstick Dipstick Cont'd Micro  Color: Amber Bilirubin: Neg WBC/hpf: NS (Not Seen)  Appearance: Slightly Cloudy Ketones: Neg RBC/hpf: 20 - 40/hpf  Specific Gravity: 1.025 Blood: 3+ Bacteria: Mod (26-50/hpf)  pH: 6.0 Protein: 2+ Cystals: Ca Oxalate  Glucose: Neg Urobilinogen: 1.0 Casts: NS (Not Seen)    Nitrites: Neg Trichomonas: Not Present    Leukocyte Esterase:  Neg Mucous: Present      Epithelial Cells: 6 - 10/hpf      Yeast: NS (Not Seen)      Sperm: Not Present    Notes:      ASSESSMENT:      ICD-10 Details  1 GU:   Renal calculus - N20.0 Bilateral, Chronic, Stable  2   Ureteral calculus - N20.1 Acute, Resolved   PLAN:            Medications Stop Meds: Hydrocodone  Bitartrate Er  Discontinue: 11/28/2023  - Reason: The medication cycle was completed.  Ibuprofen   Discontinue: 11/28/2023  - Reason: The medication cycle was completed.  HYDROcodone -Acetaminophen  5-325 MG Tablet 1 tablet PO Q 6 H PRN  Start: 04/23/2021  Discontinue: 11/28/2023  - Reason: The medication cycle was completed.            Orders Labs CULTURE, URINE  X-Rays: Renal Ultrasound (Limited) - right please    KUB          Schedule         Document Letter(s):  Created for Patient: Clinical Summary         Notes:   Will send urine for culture and treat according to culture results. Her stone is not obstructing and my suspicion that this is the cause of her pain is low. However, she did very well last year after a very similar event and reports that her pain stopped after surgery. I will follow-up with Dr. Jaymon Dudek for his recommendations. I advised that with the size of the stone being less than 4 mm, the risks of surgery versus the benefits may be too great. She verbalized her understanding   Addendum:   She had a repeat CT on 01/16/24 and the ureteral stone had passed but there were residual renal stones including a 3mm stone in the right kidney and 2mm in the left.   She was informed that the right renal stone is unlikely to be the cause of her pain, but she would like to proceed with ureteroscopy for removal.

## 2024-01-22 ENCOUNTER — Encounter (HOSPITAL_COMMUNITY): Payer: Self-pay | Admitting: Urology

## 2024-01-23 ENCOUNTER — Ambulatory Visit (HOSPITAL_COMMUNITY)

## 2024-01-23 ENCOUNTER — Ambulatory Visit (HOSPITAL_COMMUNITY): Payer: Self-pay | Admitting: Medical

## 2024-01-23 ENCOUNTER — Other Ambulatory Visit: Payer: Self-pay

## 2024-01-23 ENCOUNTER — Ambulatory Visit (HOSPITAL_COMMUNITY): Payer: Self-pay | Admitting: Anesthesiology

## 2024-01-23 ENCOUNTER — Encounter (HOSPITAL_COMMUNITY): Payer: Self-pay | Admitting: Urology

## 2024-01-23 ENCOUNTER — Encounter (HOSPITAL_COMMUNITY)
Admission: RE | Disposition: A | Discharge status: Home / Self Care | Payer: Self-pay | Source: Home / Self Care | Attending: Urology

## 2024-01-23 ENCOUNTER — Ambulatory Visit (HOSPITAL_COMMUNITY)
Admission: RE | Admit: 2024-01-23 | Discharge: 2024-01-23 | Disposition: A | Discharge status: Home / Self Care | Attending: Urology | Admitting: Urology

## 2024-01-23 DIAGNOSIS — I10 Essential (primary) hypertension: Secondary | ICD-10-CM | POA: Diagnosis not present

## 2024-01-23 DIAGNOSIS — N390 Urinary tract infection, site not specified: Secondary | ICD-10-CM | POA: Diagnosis not present

## 2024-01-23 DIAGNOSIS — N201 Calculus of ureter: Secondary | ICD-10-CM | POA: Diagnosis present

## 2024-01-23 DIAGNOSIS — N200 Calculus of kidney: Secondary | ICD-10-CM | POA: Diagnosis not present

## 2024-01-23 DIAGNOSIS — Z01818 Encounter for other preprocedural examination: Secondary | ICD-10-CM

## 2024-01-23 DIAGNOSIS — J45909 Unspecified asthma, uncomplicated: Secondary | ICD-10-CM | POA: Diagnosis not present

## 2024-01-23 DIAGNOSIS — N202 Calculus of kidney with calculus of ureter: Secondary | ICD-10-CM | POA: Insufficient documentation

## 2024-01-23 DIAGNOSIS — F319 Bipolar disorder, unspecified: Secondary | ICD-10-CM | POA: Diagnosis not present

## 2024-01-23 HISTORY — PX: CYSTOSCOPY/URETEROSCOPY/HOLMIUM LASER/STENT PLACEMENT: SHX6546

## 2024-01-23 LAB — POCT PREGNANCY, URINE: Preg Test, Ur: NEGATIVE

## 2024-01-23 SURGERY — CYSTOSCOPY/URETEROSCOPY/HOLMIUM LASER/STENT PLACEMENT
Anesthesia: General | Laterality: Right

## 2024-01-23 MED ORDER — CHLORHEXIDINE GLUCONATE 0.12 % MT SOLN
15.0000 mL | Freq: Once | OROMUCOSAL | Status: AC
  Administered 2024-01-23: 15 mL via OROMUCOSAL

## 2024-01-23 MED ORDER — MIDAZOLAM HCL 2 MG/2ML IJ SOLN
INTRAMUSCULAR | Status: AC
  Filled 2024-01-23: qty 2

## 2024-01-23 MED ORDER — ACETAMINOPHEN 10 MG/ML IV SOLN
INTRAVENOUS | Status: AC
  Filled 2024-01-23: qty 100

## 2024-01-23 MED ORDER — ONDANSETRON HCL 4 MG/2ML IJ SOLN
INTRAMUSCULAR | Status: AC
  Filled 2024-01-23: qty 2

## 2024-01-23 MED ORDER — OXYCODONE HCL 5 MG/5ML PO SOLN: 5.0000 mg | Freq: Once | ORAL | Status: AC | PRN

## 2024-01-23 MED ORDER — ORAL CARE MOUTH RINSE: 15.0000 mL | Freq: Once | OROMUCOSAL | Status: AC

## 2024-01-23 MED ORDER — 0.9 % SODIUM CHLORIDE (POUR BTL) OPTIME
TOPICAL | Status: DC | PRN
  Administered 2024-01-23: 1000 mL

## 2024-01-23 MED ORDER — SODIUM CHLORIDE 0.9% FLUSH: 3.0000 mL | Freq: Two times a day (BID) | INTRAVENOUS | Status: DC

## 2024-01-23 MED ORDER — EPHEDRINE SULFATE (PRESSORS) 50 MG/ML IJ SOLN
INTRAMUSCULAR | Status: DC | PRN
  Administered 2024-01-23 (×2): 10 mg via INTRAVENOUS
  Administered 2024-01-23 (×2): 7.5 mg via INTRAVENOUS
  Administered 2024-01-23: 10 mg via INTRAVENOUS

## 2024-01-23 MED ORDER — FENTANYL CITRATE (PF) 50 MCG/ML IJ SOSY
PREFILLED_SYRINGE | INTRAMUSCULAR | Status: AC
  Filled 2024-01-23: qty 1

## 2024-01-23 MED ORDER — PHENYLEPHRINE 80 MCG/ML (10ML) SYRINGE FOR IV PUSH (FOR BLOOD PRESSURE SUPPORT)
PREFILLED_SYRINGE | INTRAVENOUS | Status: AC
  Filled 2024-01-23: qty 10

## 2024-01-23 MED ORDER — OXYCODONE HCL 5 MG PO TABS
5.0000 mg | ORAL_TABLET | Freq: Once | ORAL | Status: AC | PRN
  Administered 2024-01-23: 5 mg via ORAL

## 2024-01-23 MED ORDER — LACTATED RINGERS IV SOLN: INTRAVENOUS | Status: DC

## 2024-01-23 MED ORDER — MIDAZOLAM HCL 5 MG/5ML IJ SOLN
INTRAMUSCULAR | Status: DC | PRN
  Administered 2024-01-23: 2 mg via INTRAVENOUS

## 2024-01-23 MED ORDER — SCOPOLAMINE 1 MG/3DAYS TD PT72: 1.0000 | MEDICATED_PATCH | TRANSDERMAL | Status: DC

## 2024-01-23 MED ORDER — SODIUM CHLORIDE 0.9 % IR SOLN
Status: DC | PRN
  Administered 2024-01-23: 3000 mL

## 2024-01-23 MED ORDER — IOHEXOL 300 MG/ML  SOLN
INTRAMUSCULAR | Status: DC | PRN
  Administered 2024-01-23: 10 mL

## 2024-01-23 MED ORDER — OXYCODONE HCL 5 MG PO TABS
ORAL_TABLET | ORAL | Status: AC
  Filled 2024-01-23: qty 1

## 2024-01-23 MED ORDER — DROPERIDOL 2.5 MG/ML IJ SOLN
INTRAMUSCULAR | Status: AC
  Filled 2024-01-23: qty 2

## 2024-01-23 MED ORDER — FENTANYL CITRATE (PF) 100 MCG/2ML IJ SOLN
INTRAMUSCULAR | Status: DC | PRN
  Administered 2024-01-23: 50 ug via INTRAVENOUS
  Administered 2024-01-23 (×2): 25 ug via INTRAVENOUS

## 2024-01-23 MED ORDER — EPHEDRINE 5 MG/ML INJ
INTRAVENOUS | Status: AC
  Filled 2024-01-23: qty 5

## 2024-01-23 MED ORDER — DROPERIDOL 2.5 MG/ML IJ SOLN
0.6250 mg | Freq: Once | INTRAMUSCULAR | Status: AC | PRN
  Administered 2024-01-23: 0.625 mg via INTRAVENOUS

## 2024-01-23 MED ORDER — CIPROFLOXACIN IN D5W 400 MG/200ML IV SOLN
400.0000 mg | INTRAVENOUS | Status: AC
  Administered 2024-01-23: 400 mg via INTRAVENOUS
  Filled 2024-01-23: qty 200

## 2024-01-23 MED ORDER — LIDOCAINE HCL (CARDIAC) PF 100 MG/5ML IV SOSY
PREFILLED_SYRINGE | INTRAVENOUS | Status: DC | PRN
  Administered 2024-01-23: 100 mg via INTRAVENOUS

## 2024-01-23 MED ORDER — SCOPOLAMINE 1 MG/3DAYS TD PT72
1.0000 | MEDICATED_PATCH | Freq: Once | TRANSDERMAL | Status: DC
  Administered 2024-01-23: 1 mg via TRANSDERMAL
  Filled 2024-01-23: qty 1

## 2024-01-23 MED ORDER — FENTANYL CITRATE (PF) 50 MCG/ML IJ SOSY
25.0000 ug | PREFILLED_SYRINGE | INTRAMUSCULAR | Status: DC | PRN
  Administered 2024-01-23 (×3): 50 ug via INTRAVENOUS

## 2024-01-23 MED ORDER — FENTANYL CITRATE (PF) 100 MCG/2ML IJ SOLN
INTRAMUSCULAR | Status: AC
  Filled 2024-01-23: qty 2

## 2024-01-23 MED ORDER — HYDROCODONE-ACETAMINOPHEN 5-325 MG PO TABS: 1.0000 | ORAL_TABLET | Freq: Four times a day (QID) | ORAL | 0 refills | Status: DC | PRN

## 2024-01-23 MED ORDER — PROPOFOL 10 MG/ML IV BOLUS
INTRAVENOUS | Status: DC | PRN
Start: 2024-01-23 — End: 2024-01-23
  Administered 2024-01-23: 30 mg via INTRAVENOUS
  Administered 2024-01-23: 150 mg via INTRAVENOUS

## 2024-01-23 MED ORDER — ACETAMINOPHEN 10 MG/ML IV SOLN
1000.0000 mg | Freq: Once | INTRAVENOUS | Status: DC | PRN
  Administered 2024-01-23: 1000 mg via INTRAVENOUS

## 2024-01-23 MED ORDER — ONDANSETRON HCL 4 MG/2ML IJ SOLN
INTRAMUSCULAR | Status: DC | PRN
  Administered 2024-01-23: 4 mg via INTRAVENOUS

## 2024-01-23 MED ORDER — PHENYLEPHRINE HCL (PRESSORS) 10 MG/ML IV SOLN
INTRAVENOUS | Status: DC | PRN
  Administered 2024-01-23 (×5): 160 ug via INTRAVENOUS

## 2024-01-23 MED ORDER — ONDANSETRON HCL 4 MG/2ML IJ SOLN
4.0000 mg | Freq: Once | INTRAMUSCULAR | Status: AC | PRN
  Administered 2024-01-23: 4 mg via INTRAVENOUS

## 2024-01-23 MED ORDER — DEXAMETHASONE SODIUM PHOSPHATE 4 MG/ML IJ SOLN
INTRAMUSCULAR | Status: DC | PRN
  Administered 2024-01-23: 8 mg via INTRAVENOUS

## 2024-01-23 SURGICAL SUPPLY — 18 items
BAG URO CATCHER STRL LF (MISCELLANEOUS) ×2 IMPLANT
BASKET STONE NCOMPASS (UROLOGICAL SUPPLIES) IMPLANT
CATH URETERAL DUAL LUMEN 10F (MISCELLANEOUS) IMPLANT
CATH URETL OPEN 5X70 (CATHETERS) IMPLANT
CLOTH BEACON ORANGE TIMEOUT ST (SAFETY) ×2 IMPLANT
EXTRACTOR STONE NITINOL NGAGE (UROLOGICAL SUPPLIES) IMPLANT
GLOVE SURG SS PI 8.0 STRL IVOR (GLOVE) ×2 IMPLANT
GOWN STRL SURGICAL XL XLNG (GOWN DISPOSABLE) ×2 IMPLANT
GUIDEWIRE STR DUAL SENSOR (WIRE) ×2 IMPLANT
KIT TURNOVER KIT A (KITS) ×2 IMPLANT
LASER FIB FLEXIVA PULSE ID 365 (Laser) IMPLANT
MANIFOLD NEPTUNE II (INSTRUMENTS) ×2 IMPLANT
PACK CYSTO (CUSTOM PROCEDURE TRAY) ×2 IMPLANT
SHEATH NAVIGATOR HD 11/13X36 (SHEATH) IMPLANT
STENT URET 6FRX24 CONTOUR (STENTS) IMPLANT
TRACTIP FLEXIVA PULS ID 200XHI (Laser) IMPLANT
TUBING CONNECTING 10 (TUBING) ×2 IMPLANT
TUBING UROLOGY SET (TUBING) ×2 IMPLANT

## 2024-01-23 NOTE — Op Note (Signed)
 Procedure: 1.  Cystoscopy with right retrograde pyelogram and interpretation. 2.  Right ureteroscopy with stone extraction. 3.  Cystoscopy with placement of right double-J stent. 4.  Application of fluoroscopy.  Pre-Op diagnosis: Right renal stone with flank pain.  Postop diagnosis: Same.  Surgeon: Dr. Norleen Seltzer.  Anesthesia: General.  Specimen: Stone.  Drain: 6 [place] by 24 cm right Contour double-J stent with tether.  EBL: None.  Complications: None.  Indications: Patient is a 38 year old female with a history of stones who was recently seen with a right ureteral stone she has had persistent pain that is intermittent with nausea and vomiting and the most recent CT scan earlier this month showed interval passage of the ureteral stone but a 3 mm right renal stone which had grown over the last several CTs.  We discussed the probability that the stone is causing her pain which is low but with the enlargement of the stone she wanted to go ahead and have it removed.  Procedure: She was taken the operating room where she was given antibiotics.  A general anesthetic was induced.  She was placed in lithotomy position and fitted with PAS hose.  Her perineum and genitalia were prepped with Betadine solution she was draped in usual sterile fashion.  Cystoscopy was performed using the 21 [place] scope and 30 degree lens.  Examination: Normal urethra.  The bladder wall was smooth and pale without tumor stones or inflammation.  Ureteral orifices were unremarkable.  A right retrograde pyelogram was performed with a 5 [place] open-ended catheter and Omnipaque .  The retrograde pyelogram demonstrated a normal ureter without filling defects and a normal intrarenal collecting system without filling defects.  A sensor wire was then advanced through the ureteral catheter to the kidney under fluoroscopic guidance and the ureteral catheter and cystoscope were removed.  The inner core of an 11/13 French 36 cm  digital access sheath was advanced over the wire to the kidney without difficulty.  This was then followed by the assembled sheath.  The inner core and wire were then removed.  The dual-lumen digital flexible ureteroscope was then advanced to the kidney and the collecting system was inspected completely with both visual and fluoroscopic confirmation.  A single stone was found in the mid lower pole calyx consistent with a stone seen on CT scan.  The stone was grasped with an engage basket and removed intact.  It did been adherent to a papilla.  Once the stone had been removed and final inspection revealed no more stones but a little bit of Randall's plaque formation, the ureteroscope was removed over wire and inspection of the ureter revealed no significant injury or residual stones.  A 6 French by 24 cm contour double-J stent was then advanced over the wire under fluoroscopic guidance.  The wire was removed, a good coil in the kidney and a good coil in the bladder.  The stent string was left exiting urethra.  Once the stent position was confirmed with fluoroscopy and endoscopy, the stent string was tied close to the meatus, trimmed to an appropriate length and tucked vaginally.  She was taken down from lithotomy position, her anesthetic was reversed and she was moved to recovery in stable condition.  There were no complications.  The stone was given to her family to bring to the office.

## 2024-01-23 NOTE — Transfer of Care (Signed)
 Immediate Anesthesia Transfer of Care Note  Patient: [name]  Procedure(s) Performed: Procedure(s): CYSTOSCOPY, URETEROSCOPY, RIGHT RETROGRADE PYELOGRAM, BASKET EXTRACTION, RIGHT URETERAL STENT PLACEMENT (Right)  Patient Location: PACU  Anesthesia Type:General  Level of Consciousness:  sedated, patient cooperative and responds to stimulation  Airway & Oxygen Therapy:Patient Spontanous Breathing and Patient connected to face mask oxgen  Post-op Assessment:  Report given to PACU RN and Post -op Vital signs reviewed and stable  Post vital signs:  Reviewed and stable  Last Vitals:  Vitals:   01/23/24 0710 01/23/24 0944  BP: 117/86 131/80  Pulse: 91 95  Resp: 16 13  Temp: 37.1 C   SpO2: 99% 98%    Complications: No apparent anesthesia complications

## 2024-01-23 NOTE — Anesthesia Procedure Notes (Signed)
 Procedure Name: LMA Insertion Date/Time: 01/23/2024 9:14 AM  Performed by: Vincenzo Show, CRNAPre-anesthesia Checklist: Patient identified, Emergency Drugs available, Suction available, Patient being monitored and Timeout performed Patient Re-evaluated:Patient Re-evaluated prior to induction Oxygen Delivery Method: Circle system utilized Preoxygenation: Pre-oxygenation with 100% oxygen Induction Type: IV induction Ventilation: Mask ventilation without difficulty LMA: LMA inserted LMA Size: 4.0 Tube size: 4.0 mm Number of attempts: 1 Placement Confirmation: positive ETCO2 and breath sounds checked- equal and bilateral Tube secured with: Tape Dental Injury: Teeth and Oropharynx as per pre-operative assessment

## 2024-01-23 NOTE — Hospital Course (Signed)
 This is somewhat

## 2024-01-23 NOTE — Anesthesia Preprocedure Evaluation (Addendum)
 Anesthesia Evaluation  Patient identified by MRN, date of birth, ID band Patient awake    Reviewed: Allergy & Precautions, NPO status , Patient's Chart, lab work & pertinent test results  History of Anesthesia Complications (+) PONV and history of anesthetic complications  Airway Mallampati: I  TM Distance: >3 FB Neck ROM: Full    Dental no notable dental hx. (+) Teeth Intact, Dental Advisory Given   Pulmonary asthma , sleep apnea , neg COPD   breath sounds clear to auscultation       Cardiovascular hypertension,  Rhythm:Regular Rate:Normal     Neuro/Psych  Headaches    Bipolar Disorder      GI/Hepatic ,neg GERD  ,,  Endo/Other    Renal/GU      Musculoskeletal   Abdominal   Peds  Hematology   Anesthesia Other Findings   Reproductive/Obstetrics                              Anesthesia Physical Anesthesia Plan  ASA: 2  Anesthesia Plan: General   Post-op Pain Management:    Induction: Intravenous  PONV Risk Score and Plan: 2 and Ondansetron , Midazolam , Treatment may vary due to age or medical condition and Scopolamine  patch - Pre-op  Airway Management Planned: LMA  Additional Equipment: None  Intra-op Plan:   Post-operative Plan: Extubation in OR  Informed Consent:      Dental advisory given  Plan Discussed with: CRNA and Surgeon  Anesthesia Plan Comments:          Anesthesia Quick Evaluation

## 2024-01-23 NOTE — Anesthesia Postprocedure Evaluation (Signed)
 Anesthesia Post Note  Patient: [name]  Procedure(s) Performed: CYSTOSCOPY, URETEROSCOPY, RIGHT RETROGRADE PYELOGRAM, BASKET EXTRACTION, RIGHT URETERAL STENT PLACEMENT (Right)     Patient location during evaluation: PACU Anesthesia Type: General Level of consciousness: awake Pain management: pain level controlled Vital Signs Assessment: post-procedure vital signs reviewed and stable Respiratory status: spontaneous breathing Cardiovascular status: blood pressure returned to baseline Postop Assessment: no apparent nausea or vomiting Anesthetic complications: no   No notable events documented.  Last Vitals:  Vitals:   01/23/24 1040 01/23/24 1048  BP: 125/73 118/79  Pulse: 88 93  Resp: 18 18  Temp:    SpO2: 95% 98%    Last Pain:  Vitals:   01/23/24 1048  TempSrc:   PainSc: 3                  Lauraine KATHEE [place]

## 2024-01-23 NOTE — Discharge Instructions (Signed)
 You may remove the stent on Thursday morning by pulling the attached string.

## 2024-01-23 NOTE — Interval H&P Note (Signed)
 History and Physical Interval Note:  She continues to have intermittent pain and nausea.  The CT showed the ureteral stone has passed but the RLP stone has enlarged.  She is aware that removing the stone may not eliminate the pain but she wants to proceed.  She has no UTI symptoms or hematuria.    01/23/2024 8:43 AM  [name]  has presented today for surgery, with the diagnosis of RIGHT URETERAL AND RENAL STONES.  The various methods of treatment have been discussed with the patient and family. After consideration of risks, benefits and other options for treatment, the patient has consented to  Procedure(s): CYSTOSCOPY/URETEROSCOPY/HOLMIUM LASER/STENT PLACEMENT (Right) as a surgical intervention.  The patient's history has been reviewed, patient examined, no change in status, stable for surgery.  I have reviewed the patient's chart and labs.  Questions were answered to the patient's satisfaction.     [name]

## 2024-01-24 ENCOUNTER — Encounter (HOSPITAL_COMMUNITY): Payer: Self-pay | Admitting: Urology

## 2024-01-24 DIAGNOSIS — R400 Somnolence: Secondary | ICD-10-CM | POA: Diagnosis not present

## 2024-01-25 ENCOUNTER — Telehealth: Payer: Self-pay

## 2024-01-25 DIAGNOSIS — R519 Headache, unspecified: Secondary | ICD-10-CM

## 2024-01-25 DIAGNOSIS — G43711 Chronic migraine without aura, intractable, with status migrainosus: Secondary | ICD-10-CM

## 2024-01-25 DIAGNOSIS — G4719 Other hypersomnia: Secondary | ICD-10-CM

## 2024-01-25 DIAGNOSIS — E785 Hyperlipidemia, unspecified: Secondary | ICD-10-CM

## 2024-01-25 DIAGNOSIS — I10 Essential (primary) hypertension: Secondary | ICD-10-CM

## 2024-01-25 NOTE — Telephone Encounter (Signed)
 I do not see where Dr. Ines mentioned Crestor  in her last office visit.  This really should be coming from her primary care provider it looks like they checked her lipids recently.  Please reach out to the patient and see if her primary care can continue to refill this prescription.  If not I certainly can give a refill until I see her next.

## 2024-01-25 NOTE — Telephone Encounter (Signed)
 Last filled by patient on 10/15/23 as a 90 day supply Last office visit : 09/26/23 with Dr. Ines  Next office visit : 07/02/24 with Megan, NP Patient does not have an new assigned provider yet.

## 2024-01-30 ENCOUNTER — Encounter: Payer: Self-pay | Admitting: Family Medicine

## 2024-01-30 DIAGNOSIS — E782 Mixed hyperlipidemia: Secondary | ICD-10-CM

## 2024-02-06 ENCOUNTER — Telehealth: Admitting: Physician Assistant

## 2024-02-06 DIAGNOSIS — J208 Acute bronchitis due to other specified organisms: Secondary | ICD-10-CM

## 2024-02-06 DIAGNOSIS — B9689 Other specified bacterial agents as the cause of diseases classified elsewhere: Secondary | ICD-10-CM | POA: Diagnosis not present

## 2024-02-06 MED ORDER — AZITHROMYCIN 250 MG PO TABS: ORAL_TABLET | ORAL | 0 refills | Status: AC

## 2024-02-06 MED ORDER — BENZONATATE 100 MG PO CAPS: 100.0000 mg | ORAL_CAPSULE | Freq: Three times a day (TID) | ORAL | 0 refills | Status: DC | PRN

## 2024-02-06 NOTE — Progress Notes (Signed)
 We are sorry that you are not feeling well.  Here is how we plan to help!  Based on your presentation I believe you most likely have A cough due to bacteria.  When patients have a fever and a productive cough with a change in color or increased sputum production, we are concerned about bacterial bronchitis.  If left untreated it can progress to pneumonia.  If your symptoms do not improve with your treatment plan it is important that you contact your provider.   I have prescribed Azithromyin 250 mg: two tablets now and then one tablet daily for 4 additonal days    In addition you may use A prescription cough medication called Tessalon  Perles 100mg . You may take 1-2 capsules every 8 hours as needed for your cough.  From your responses in the eVisit questionnaire you describe inflammation in the upper respiratory tract which is causing a significant cough.  This is commonly called Bronchitis and has four common causes:   Allergies Viral Infections Acid Reflux Bacterial Infection Allergies, viruses and acid reflux are treated by controlling symptoms or eliminating the cause. An example might be a cough caused by taking certain blood pressure medications. You stop the cough by changing the medication. Another example might be a cough caused by acid reflux. Controlling the reflux helps control the cough.  USE OF BRONCHODILATOR (RESCUE) INHALERS: There is a risk from using your bronchodilator too frequently.  The risk is that over-reliance on a medication which only relaxes the muscles surrounding the breathing tubes can reduce the effectiveness of medications prescribed to reduce swelling and congestion of the tubes themselves.  Although you feel brief relief from the bronchodilator inhaler, your asthma may actually be worsening with the tubes becoming more swollen and filled with mucus.  This can delay other crucial treatments, such as oral steroid medications. If you need to use a bronchodilator inhaler  daily, several times per day, you should discuss this with your provider.  There are probably better treatments that could be used to keep your asthma under control.     HOME CARE Only take medications as instructed by your medical team. Complete the entire course of an antibiotic. Drink plenty of fluids and get plenty of rest. Avoid close contacts especially the very young and the elderly Cover your mouth if you cough or cough into your sleeve. Always remember to wash your hands A steam or ultrasonic humidifier can help congestion.   GET HELP RIGHT AWAY IF: You develop worsening fever. You become short of breath You cough up blood. Your symptoms persist after you have completed your treatment plan MAKE SURE YOU  Understand these instructions. Will watch your condition. Will get help right away if you are not doing well or get worse.  Your e-visit answers were reviewed by a board certified advanced clinical practitioner to complete your personal care plan.  Depending on the condition, your plan could have included both over the counter or prescription medications. If there is a problem please reply  once you have received a response from your provider. Your safety is important to us .  If you have drug allergies check your prescription carefully.    You can use MyChart to ask questions about today's visit, request a non-urgent call back, or ask for a work or school excuse for 24 hours related to this e-Visit. If it has been greater than 24 hours you will need to follow up with your provider, or enter a new e-Visit to  address those concerns. You will get an e-mail in the next two days asking about your experience.  I hope that your e-visit has been valuable and will speed your recovery. Thank you for using e-visits.   I have spent 5 minutes in review of e-visit questionnaire, review and updating patient chart, medical decision making and response to patient.   Elsie Velma Lunger,  PA-C

## 2024-02-14 ENCOUNTER — Encounter: Admitting: Obstetrics & Gynecology

## 2024-02-14 ENCOUNTER — Ambulatory Visit (INDEPENDENT_AMBULATORY_CARE_PROVIDER_SITE_OTHER): Admitting: Obstetrics & Gynecology

## 2024-02-14 ENCOUNTER — Other Ambulatory Visit (HOSPITAL_COMMUNITY)
Admission: RE | Admit: 2024-02-14 | Discharge: 2024-02-14 | Disposition: A | Discharge status: Home / Self Care | Source: Ambulatory Visit | Attending: Obstetrics & Gynecology | Admitting: Obstetrics & Gynecology

## 2024-02-14 ENCOUNTER — Encounter: Payer: Self-pay | Admitting: Obstetrics & Gynecology

## 2024-02-14 VITALS — BP 118/87 | HR 86 | Wt 209.0 lb

## 2024-02-14 DIAGNOSIS — N838 Other noninflammatory disorders of ovary, fallopian tube and broad ligament: Secondary | ICD-10-CM | POA: Diagnosis not present

## 2024-02-14 DIAGNOSIS — N921 Excessive and frequent menstruation with irregular cycle: Secondary | ICD-10-CM

## 2024-02-14 DIAGNOSIS — N898 Other specified noninflammatory disorders of vagina: Secondary | ICD-10-CM

## 2024-02-14 DIAGNOSIS — Z98891 History of uterine scar from previous surgery: Secondary | ICD-10-CM

## 2024-02-14 DIAGNOSIS — Z1331 Encounter for screening for depression: Secondary | ICD-10-CM

## 2024-02-14 NOTE — Progress Notes (Signed)
 GYN VISIT Patient name: [name] MRN [id number]  Date of birth: 1985/12/12 Chief Complaint:   complex cyst on Left ovary  History of Present Illness:   [name] is a 38 y.o. [id number] female being seen today for the following cocncerns:  1) ?Ovarian mass:  Seen by urology for stones- noted to have ?ovarian mass on today's bedside US .  Patient notes history of ovarian cyst.  Denies pelvic or abdominal pain outside of her usual dysmenorrhea    2) HMB/AUB: Menses are every month, not always around the same time but close.  Typically last for about 4-10 days. She will have about 4 heavy days using 3-4 overnight pads per day with passage of orange-sized clots.  +dysmenorrhea  Notes h/o BTL.     CD x 2  Notes h/o migraines with aura.  She has tried Mirena in the past and noted significant irregular bleeding as well as recurrent infections, device was removed within a year.  Notes longstanding issues with hormonal medications.  Notes h/o BV, some clear discharge noted.  No itching or odor.  Patient's last menstrual period was 01/25/2024.    Review of Systems:   Pertinent items are noted in HPI Denies fever/chills, dizziness, headaches, visual disturbances, fatigue, shortness of breath, chest pain, abdominal pain, vomiting Pertinent History Reviewed:   Past Surgical History:  Procedure Laterality Date   APPENDECTOMY  2006   CESAREAN SECTION     COLONOSCOPY N/A 01/05/2024   Procedure: COLONOSCOPY;  Surgeon: Cinderella Deatrice FALCON, MD;  Location: [facility];  Service: Endoscopy;  Laterality: N/A;  11;00 am, asa 1-2   CYSTOSCOPY/URETEROSCOPY/HOLMIUM LASER/STENT PLACEMENT Right 04/16/2021   Procedure: CYSTOSCOPY RIGHT RETROGRADE PYELOGRAM  URETEROSCOPY/HOLMIUM LASER/STENT PLACEMENT;  Surgeon: Watt Rush, MD;  Location[facility];  Service: Urology;  Laterality: Right;   CYSTOSCOPY/URETEROSCOPY/HOLMIUM LASER/STENT PLACEMENT Right 01/23/2024   Procedure: CYSTOSCOPY,  URETEROSCOPY, RIGHT RETROGRADE PYELOGRAM, BASKET EXTRACTION, RIGHT URETERAL STENT PLACEMENT;  Surgeon: Watt Rush, MD;  Location: [facility];  Service: Urology;  Laterality: Right;   HOLMIUM LASER APPLICATION Right 04/16/2021   Procedure: HOLMIUM LASER APPLICATION;  Surgeon: Watt Rush, MD;  Location: [facility];  Service: Urology;  Laterality: Right;   KIDNEY STONE SURGERY  04/16/2021   r-kidney   KNEE SURGERY Right    scoped x2   MEDIAL PATELLOFEMORAL LIGAMENT REPAIR Right 05/04/2023   Procedure: MEDIAL PATELLA FEMORAL LIGAMENT RECONSTRUCTION WITH OSTEOCHONDRAL AUTOGRAFT;  Surgeon: Cristy Bonner DASEN, MD;  Location: [facility];  Service: Orthopedics;  Laterality: Right;   TUBAL LIGATION  2017   WISDOM TOOTH EXTRACTION  2010    Past Medical History:  Diagnosis Date   Allergy 1989   Anemia 2008   Anxiety    Borderline personality disorder (HCC)    Headache    History of kidney stones    Hydradenitis    Hyperlipidemia 09/25/2021   Hypertension 01/2023   Hypertension in pregnancy    Knee pain    Nephrolithiasis    Postpartum depression    Preeclampsia    Sleep apnea 04/2023   Reviewed problem list, medications and allergies. Physical Assessment:   Vitals:   02/14/24 1337  BP: 118/87  Pulse: 86  Weight: 209 lb (94.8 kg)  Body mass index is 33.73 kg/m.       Physical Examination:   General appearance: alert, well appearing, and in no distress  Psych: mood appropriate, normal affect  Skin: warm & dry   Cardiovascular:  normal heart rate noted  Respiratory: normal respiratory effort, no distress  Abdomen: soft, non-tender, no rebound, no guarding  Pelvic: VULVA: normal appearing vulva with no masses, tenderness or lesions, VAGINA: normal appearing vagina with normal color and discharge, no lesions, CERVIX: normal appearing cervix without discharge or lesions, UTERUS: uterus is normal size, shape, consistency and nontender, ADNEXA: normal adnexa in  size, nontender and no masses  Extremities: no edema   Chaperone: Latisha Cresenzo    Assessment & Plan:  1) AUB -plan for lab work to check for anemia - Due to medical history discussed progesterone only options - Patient declined conservative options and wishes to can consider surgical interventions - Discussed endometrial ablation-removed risk benefits and indications and anticipated recovery --Discussed robotic- assisted laparoscopic hysterectomy and bilateral salpingectomy  -Ideally plan for ovarian preservation; however, depends on results of upcoming ultrasound  -Explained that this surgery is performed to remove the uterus through several small incisions in the abdomen- pending anatomy 3-5 ports. I discussed the risks and benefits of the surgery, including, but not limited to risk of bleeding, including the need for blood transfusion, infection, damage to surrounding organs and tissues such as damage to bladder, ureter or bowel that would requiring additional procedures.  Reviewed long term complications such as fistula or dehiscence requiring further surgical intervention.  Discussed possible need for conversion to an open procedure and potential for other complications that cannot be predicted or prevented including DVT, PE or death. -reviewed same day procedure -typical recovery 8-12 wks with several weeks off work and 8wks of pelvic rest -questions/concerns were addressed and pt desires to proceed  []  Plan to discontinue Wegovy  1 week prior to surgery []  Will plan to schedule once ultrasound completed   2) Ovarian mass - Plan for pelvic ultrasound at next available - Further management pending results  3) Vaginal discharge []  plan to r/o infection    Orders Placed This Encounter  Procedures   US  PELVIC COMPLETE WITH TRANSVAGINAL   CBC   Vitamin B12   Folate   Iron and TIBC   Ferritin    Return for pelvic US  next available.   [name], DO Attending  Obstetrician & Gynecologist, [facility] for [employer], [facility]

## 2024-02-14 NOTE — Addendum Note (Signed)
 Addended by: ILEAN RUTHERFORD HERO on: 02/14/2024 03:18 PM   Modules accepted: Orders

## 2024-02-15 ENCOUNTER — Ambulatory Visit: Payer: Self-pay | Admitting: Obstetrics & Gynecology

## 2024-02-15 LAB — CBC
Hematocrit: 39.2 % (ref 34.0–46.6)
Hemoglobin: 13.1 g/dL (ref 11.1–15.9)
MCH: 32 pg (ref 26.6–33.0)
MCHC: 33.4 g/dL (ref 31.5–35.7)
MCV: 96 fL (ref 79–97)
Platelets: 232 x10E3/uL (ref 150–450)
RBC: 4.09 x10E6/uL (ref 3.77–5.28)
RDW: 12.3 % (ref 11.7–15.4)
WBC: 3.8 x10E3/uL (ref 3.4–10.8)

## 2024-02-15 LAB — IRON AND TIBC
Iron Saturation: 28 % (ref 15–55)
Iron: 78 ug/dL (ref 27–159)
Total Iron Binding Capacity: 282 ug/dL (ref 250–450)
UIBC: 204 ug/dL (ref 131–425)

## 2024-02-15 LAB — FOLATE: Folate: 6.7 ng/mL (ref >3.0)

## 2024-02-15 LAB — VITAMIN B12: Vitamin B-12: 297 pg/mL (ref 232–1245)

## 2024-02-15 LAB — FERRITIN: Ferritin: 180 ng/mL — ABNORMAL HIGH (ref 15–150)

## 2024-02-16 ENCOUNTER — Telehealth: Admitting: Family Medicine

## 2024-02-16 DIAGNOSIS — K047 Periapical abscess without sinus: Secondary | ICD-10-CM | POA: Diagnosis not present

## 2024-02-16 LAB — CERVICOVAGINAL ANCILLARY ONLY
Bacterial Vaginitis (gardnerella): NEGATIVE
Candida Glabrata: NEGATIVE
Candida Vaginitis: NEGATIVE
Comment: NEGATIVE
Comment: NEGATIVE
Comment: NEGATIVE

## 2024-02-16 MED ORDER — CLINDAMYCIN HCL 300 MG PO CAPS: 300.0000 mg | ORAL_CAPSULE | Freq: Three times a day (TID) | ORAL | 0 refills | Status: DC

## 2024-02-16 NOTE — Progress Notes (Signed)
 E-Visit for Dental Pain  We are sorry that you are not feeling well.  Here is how we plan to help!  Based on what you have shared with me in the questionnaire, it sounds like you have a dental infection  Clindamycin 300mg  3 times a day for 7 days  It is imperative that you see a dentist within 10 days of this eVisit to determine the cause of the dental pain and be sure it is adequately treated  A toothache or tooth pain is caused when the nerve in the root of a tooth or surrounding a tooth is irritated. Dental (tooth) infection, decay, injury, or loss of a tooth are the most common causes of dental pain. Pain may also occur after an extraction (tooth is pulled out). Pain sometimes originates from other areas and radiates to the jaw, thus appearing to be tooth pain.Bacteria growing inside your mouth can contribute to gum disease and dental decay, both of which can cause pain. A toothache occurs from inflammation of the central portion of the tooth called pulp. The pulp contains nerve endings that are very sensitive to pain. Inflammation to the pulp or pulpitis may be caused by dental cavities, trauma, and infection.    HOME CARE:   For toothaches: Over-the-counter pain medications such as acetaminophen  or ibuprofen  may be used. Take these as directed on the package while you arrange for a dental appointment. Avoid very cold or hot foods, because they may make the pain worse. You may get relief from biting on a cotton ball soaked in oil of cloves. You can get oil of cloves at most drug stores.  For jaw pain:  Aspirin  may be helpful for problems in the joint of the jaw in adults. If pain happens every time you open your mouth widely, the temporomandibular joint (TMJ) may be the source of the pain. Yawning or taking a large bite of food may worsen the pain. An appointment with your doctor or dentist will help you find the cause.     GET HELP RIGHT AWAY IF:  You have a high fever or chills If  you have had a recent head or face injury and develop headache, light headedness, nausea, vomiting, or other symptoms that concern you after an injury to your face or mouth, you could have a more serious injury in addition to your dental injury. A facial rash associated with a toothache: This condition may improve with medication. Contact your doctor for them to decide what is appropriate. Any jaw pain occurring with chest pain: Although jaw pain is most commonly caused by dental disease, it is sometimes referred pain from other areas. People with heart disease, especially people who have had stents placed, people with diabetes, or those who have had heart surgery may have jaw pain as a symptom of heart attack or angina. If your jaw or tooth pain is associated with lightheadedness, sweating, or shortness of breath, you should see a doctor as soon as possible. Trouble swallowing or excessive pain or bleeding from gums: If you have a history of a weakened immune system, diabetes, or steroid use, you may be more susceptible to infections. Infections can often be more severe and extensive or caused by unusual organisms. Dental and gum infections in people with these conditions may require more aggressive treatment. An abscess may need draining or IV antibiotics, for example.  MAKE SURE YOU   Understand these instructions. Will watch your condition. Will get help right away if you  are not doing well or get worse.  Thank you for choosing an e-visit.  Your e-visit answers were reviewed by a board certified advanced clinical practitioner to complete your personal care plan. Depending upon the condition, your plan could have included both over the counter or prescription medications.  Please review your pharmacy choice. Make sure the pharmacy is open so you can pick up prescription now. If there is a problem, you may contact your provider through [employer] and have the prescription routed to another  pharmacy.  Your safety is important to us . If you have drug allergies check your prescription carefully.   For the next 24 hours you can use MyChart to ask questions about today's visit, request a non-urgent call back, or ask for a work or school excuse. You will get an email in the next two days asking about your experience. I hope that your e-visit has been valuable and will speed your recovery.  I have spent 5 minutes in review of e-visit questionnaire, review and updating patient chart, medical decision making and response to patient.   Duron Meister, FNP

## 2024-02-20 ENCOUNTER — Ambulatory Visit (INDEPENDENT_AMBULATORY_CARE_PROVIDER_SITE_OTHER): Admitting: Radiology

## 2024-02-20 DIAGNOSIS — N921 Excessive and frequent menstruation with irregular cycle: Secondary | ICD-10-CM | POA: Diagnosis not present

## 2024-02-20 DIAGNOSIS — N838 Other noninflammatory disorders of ovary, fallopian tube and broad ligament: Secondary | ICD-10-CM | POA: Diagnosis not present

## 2024-02-20 NOTE — Progress Notes (Signed)
 GYN US : TA and TV imaging performed - vinyl probe cover used - Chaperone:  Emma Anteverted uterus, upper limits of normal in size. symmetrical, homogeneous myometrium with single intramural fibroid mid posterior wall = 43 x 32 x 43 mm Endom thickness = 6.6 mm, uniform avascular cavity and canal, no evidence of intracavitary defects The Right ovary appears normal and mobile,  The Left ovary is seen with avascular cyst with thin, smoothly marginated walls measuring 70 x 44 x 45 mm, internal reticular echopattern with low level echoes suggests hemorrhage.  Normal blood flow to ovary.  L ov appears mobile.  ? Hemorrhagic cyst  vs  endometrioma.  neg adnexal regions  -  neg CDS, no free fluid present

## 2024-02-21 ENCOUNTER — Encounter: Payer: Self-pay | Admitting: Obstetrics & Gynecology

## 2024-02-22 ENCOUNTER — Encounter (INDEPENDENT_AMBULATORY_CARE_PROVIDER_SITE_OTHER): Payer: Self-pay | Admitting: Gastroenterology

## 2024-02-22 ENCOUNTER — Ambulatory Visit (INDEPENDENT_AMBULATORY_CARE_PROVIDER_SITE_OTHER): Admitting: Gastroenterology

## 2024-02-22 ENCOUNTER — Other Ambulatory Visit: Payer: Self-pay | Admitting: Obstetrics & Gynecology

## 2024-02-22 VITALS — BP 122/83 | HR 84 | Temp 97.8°F | Ht 66.0 in | Wt 207.3 lb

## 2024-02-22 DIAGNOSIS — D251 Intramural leiomyoma of uterus: Secondary | ICD-10-CM

## 2024-02-22 DIAGNOSIS — K641 Second degree hemorrhoids: Secondary | ICD-10-CM

## 2024-02-22 DIAGNOSIS — K581 Irritable bowel syndrome with constipation: Secondary | ICD-10-CM | POA: Diagnosis not present

## 2024-02-22 DIAGNOSIS — N83202 Unspecified ovarian cyst, left side: Secondary | ICD-10-CM

## 2024-02-22 DIAGNOSIS — N939 Abnormal uterine and vaginal bleeding, unspecified: Secondary | ICD-10-CM

## 2024-02-22 DIAGNOSIS — R112 Nausea with vomiting, unspecified: Secondary | ICD-10-CM

## 2024-02-22 MED ORDER — ONDANSETRON 4 MG PO TBDP: ORAL_TABLET | ORAL | 0 refills | Status: DC

## 2024-02-22 MED ORDER — PROMETHAZINE HCL 12.5 MG PO TABS: 12.5000 mg | ORAL_TABLET | Freq: Four times a day (QID) | ORAL | 0 refills | Status: AC | PRN

## 2024-02-22 NOTE — Progress Notes (Signed)
-   Called to review results of ultrasound - Discussed findings including small fibroid as well as ovarian cyst suggestive of hemorrhagic cyst - Discussed management options, based on size of fibroid reviewed that endometrial ablation studies were completed on women with fibroids less than 3 cm and concern for lower efficacy of procedure - After much discussion patient wishes to proceed with hysterectomy.  She had previously been consented regarding risks and benefits, she had done some review on her own and feels like that is the better option - Will plan to schedule for December 3 - Surgical referral created  Khristy Kalan, DO Attending Obstetrician & Gynecologist, [facility], [facility]

## 2024-02-22 NOTE — Patient Instructions (Signed)
 continue linzess  145mcg daily -continue miralax  and metamucil daily -Increase water intake, aim for atleast 64 oz per day -Increase fruits, veggies and whole grains, kiwi and prunes are especially good for constipation -Avoid straining, limit toilet time   Follow up 1 year  It was a pleasure to see you today. I want to create trusting relationships with patients and provide genuine, compassionate, and quality care. I truly value your feedback! please be on the lookout for a survey regarding your visit with me today. I appreciate your input about our visit and your time in completing this!    Alicha Raspberry L. Keedan Sample, MSN, APRN, AGNP-C Adult-Gerontology Nurse Practitioner [facility] at [facility]

## 2024-02-22 NOTE — Progress Notes (Signed)
 Referring Provider: Severa Rock HERO, FNP Primary Care Physician:  Severa Rock HERO, FNP Primary GI Physician: Dr. Cinderella  Chief Complaint  Patient presents with   Follow-up    Patient here today for a follow up on Hemorrhoids. Patient says the hemorrhoids are doing much better since starting Linzess  145 mcg, Metamucil once per day, Miralax  once per day.Patient denies any current issues with rectal pain, bleeding,swelling in the rectal area, and reports some of the hemorrhoids have retracted.   HPI:   [name] is a 38 y.o. female with past medical history of kidney stones   Patient presenting today for:  Follow up of constipation and rectal bleeding from hemorrhoids   Last seen August by Dr. Cinderella, at that time reported chronic constipation, BM every 1-2 weeks, some solid stools followed by diarrhea, some abdominal discomfort improved with defecation. Some protrusion from anus, thinks this is hemorrhoids with some rectal bleeding.   Recommended start linzess  145mcg daily, anusol  suppository, colonoscopy, good water intake, miralax  BID x 1 week then daily, metamucil BID  Last CBC in October with hgb 12.1 BMP normal other than potassium 3.4  Present:  Doing much better on miralax  daily metamucil daily and linzess  . Abdominal pain has improved quite a bit. Usually has a Bm atleast once daily, sometimes can go 2-3 days without a BM but this Is rare. She may repeat a Capful of miralax  if she goes a few days without a BM. Hemorrhoids have improved, rare toilet tissue hematochezia If she wipes too hard. Not having to strain to defecate, feels hemorrhoid tissue not protruding as much as previously. Feels much less bloated.  Appetite is down some on wegovy , denies nausea or vomiting. She reports she just found out she has ovarian cyst and a fibroid, she has upcoming hysterectomy, recently just got over kidney stones.    Last Colonoscopy:12/2023  - Diverticulosis in the right colon. -  Non-bleeding internal hemorrhoids. - The examined portion of the ileum was normal. - No specimens collected.  Last Endoscopy: Never    Filed Weights   02/22/24 1057  Weight: 207 lb 4.8 oz (94 kg)     Past Medical History:  Diagnosis Date   Allergy 1989   Anemia 2008   Anxiety    Borderline personality disorder (HCC)    Headache    History of kidney stones    Hydradenitis    Hyperlipidemia 09/25/2021   Hypertension 01/2023   Hypertension in pregnancy    Knee pain    Nephrolithiasis    Postpartum depression    Preeclampsia    Sleep apnea 04/2023    Past Surgical History:  Procedure Laterality Date   APPENDECTOMY  2006   CESAREAN SECTION     COLONOSCOPY N/A 01/05/2024   Procedure: COLONOSCOPY;  Surgeon: Cinderella Deatrice FALCON, MD;  Location: [facility];  Service: Endoscopy;  Laterality: N/A;  11;00 am, asa 1-2   CYSTOSCOPY/URETEROSCOPY/HOLMIUM LASER/STENT PLACEMENT Right 04/16/2021   Procedure: CYSTOSCOPY RIGHT RETROGRADE PYELOGRAM  URETEROSCOPY/HOLMIUM LASER/STENT PLACEMENT;  Surgeon: Watt Rush, MD;  Location: [facility];  Service: Urology;  Laterality: Right;   CYSTOSCOPY/URETEROSCOPY/HOLMIUM LASER/STENT PLACEMENT Right 01/23/2024   Procedure: CYSTOSCOPY, URETEROSCOPY, RIGHT RETROGRADE PYELOGRAM, BASKET EXTRACTION, RIGHT URETERAL STENT PLACEMENT;  Surgeon: Watt Rush, MD;  Location: [facility];  Service: Urology;  Laterality: Right;   HOLMIUM LASER APPLICATION Right 04/16/2021   Procedure: HOLMIUM LASER APPLICATION;  Surgeon: Watt Rush, MD;  Location: [facility];  Service: Urology;  Laterality: Right;   KIDNEY STONE SURGERY  04/16/2021   r-kidney   KNEE SURGERY Right    scoped x2   MEDIAL PATELLOFEMORAL LIGAMENT REPAIR Right 05/04/2023   Procedure: MEDIAL PATELLA FEMORAL LIGAMENT RECONSTRUCTION WITH OSTEOCHONDRAL AUTOGRAFT;  Surgeon: Cristy Bonner DASEN, MD;  Location: [facility];  Service: Orthopedics;  Laterality: Right;   TUBAL  LIGATION  2017   WISDOM TOOTH EXTRACTION  2010    Current Outpatient Medications  Medication Sig Dispense Refill   candesartan  (ATACAND ) 8 MG tablet Take 1 tablet (8 mg total) by mouth daily. 60 tablet 2   clindamycin (CLEOCIN T) 1 % SWAB Apply 1 Swab topically 2 (two) times daily as needed (skin blemishes).     Fremanezumab -vfrm (AJOVY ) 225 MG/1.5ML SOAJ Inject 225 mg into the skin every 30 (thirty) days. 1.5 mL 11   gabapentin  (NEURONTIN ) 300 MG capsule Take 1 capsule (300 mg total) by mouth 4 (four) times daily. 120 capsule 2   HYDROcodone -acetaminophen  (NORCO/VICODIN) 5-325 MG tablet Take 1 tablet by mouth every 6 (six) hours as needed. 12 tablet 0   lamoTRIgine  (LAMICTAL ) 100 MG tablet Take 2 tablets (200 mg total) by mouth daily. 180 tablet 0   linaclotide  (LINZESS ) 145 MCG CAPS capsule Take 1 capsule (145 mcg total) by mouth daily. 90 capsule 1   ondansetron  (ZOFRAN -ODT) 4 MG disintegrating tablet 4mg  ODT q4 hours prn nausea/vomit 20 tablet 0   promethazine  (PHENERGAN ) 12.5 MG tablet Take 1 tablet (12.5 mg total) by mouth every 6 (six) hours as needed for nausea or vomiting. 30 tablet 0   propranolol  (INDERAL ) 10 MG tablet Take 1 tablet (10 mg total) by mouth 3 (three) times daily as needed. 90 tablet 2   rizatriptan  (MAXALT -MLT) 10 MG disintegrating tablet Take 1 tablet (10 mg total) by mouth as needed for migraine. May repeat in 2 hours if needed 9 tablet 11   Semaglutide -Weight Management (WEGOVY ) 2.4 MG/0.75ML SOAJ Inject 2.4 mg into the skin once a week. 9 mL 4   traZODone  (DESYREL ) 100 MG tablet Take 1 tablet (100 mg total) by mouth at bedtime. 90 tablet 0   Vitamin D , Ergocalciferol , (DRISDOL ) 1.25 MG (50000 UNIT) CAPS capsule Take 1 capsule (50,000 Units total) by mouth every 7 (seven) days. 5 capsule 5   vortioxetine  HBr (TRINTELLIX ) 20 MG TABS tablet Take 1 tablet (20 mg total) by mouth daily. 30 tablet 2   No current facility-administered medications for this visit.     Allergies as of 02/22/2024 - Review Complete 02/22/2024  Allergen Reaction Noted   Hydroquinone Anaphylaxis, Shortness Of Breath, and Swelling 06/05/2019   Sodium hypochlorite Shortness Of Breath 10/15/2015   Keflex [cephalexin] Rash 09/17/2015   Latex Rash 09/17/2015    Social History   Socioeconomic History   Marital status: Significant Other    Spouse name: Not on file   Number of children: Not on file   Years of education: Not on file   Highest education level: Some college, no degree  Occupational History   Not on file  Tobacco Use   Smoking status: Never    Passive exposure: Past   Smokeless tobacco: Never  Vaping Use   Vaping status: Never Used  Substance and Sexual Activity   Alcohol  use: No   Drug use: Not Currently   Sexual activity: Yes    Birth control/protection: None, Surgical    Comment: tubal  Other Topics Concern   Not on file  Social History Narrative  Caffiene 300mg  daily   Working : delivery service    Social Drivers of Health   Financial Resource Strain: High Risk (11/03/2023)   Overall Financial Resource Strain (CARDIA)    Difficulty of Paying Living Expenses: Hard  Food Insecurity: Food Insecurity Present (11/03/2023)   Hunger Vital Sign    Worried About Running Out of Food in the Last Year: Sometimes true    Ran Out of Food in the Last Year: Sometimes true  Transportation Needs: Unmet Transportation Needs (11/03/2023)   PRAPARE - Transportation    Lack of Transportation (Medical): Yes    Lack of Transportation (Non-Medical): Yes  Physical Activity: Insufficiently Active (11/03/2023)   Exercise Vital Sign    Days of Exercise per Week: 4 days    Minutes of Exercise per Session: 20 min  Stress: Stress Concern Present (11/03/2023)   [employer] of Occupational Health - Occupational Stress Questionnaire    Feeling of Stress: To some extent  Social Connections: Moderately Integrated (11/03/2023)   Social Connection and Isolation Panel     Frequency of Communication with Friends and Family: More than three times a week    Frequency of Social Gatherings with Friends and Family: More than three times a week    Attends Religious Services: Patient declined    Database Administrator or Organizations: Yes    Attends Engineer, Structural: More than 4 times per year    Marital Status: Living with partner    Review of systems General: negative for malaise, night sweats, fever, chills, weight loss Neck: Negative for lumps, goiter, pain and significant neck swelling Resp: Negative for cough, wheezing, dyspnea at rest CV: Negative for chest pain, leg swelling, palpitations, orthopnea GI: denies melena, hematochezia, nausea, vomiting, diarrhea, constipation, dysphagia, odyonophagia, early satiety or unintentional weight loss.  MSK: Negative for joint pain or swelling, back pain, and muscle pain. Derm: Negative for itching or rash Psych: Denies depression, anxiety, memory loss, confusion. No homicidal or suicidal ideation.  Heme: Negative for prolonged bleeding, bruising easily, and swollen nodes. Endocrine: Negative for cold or heat intolerance, polyuria, polydipsia and goiter. Neuro: negative for tremor, gait imbalance, syncope and seizures. The remainder of the review of systems is noncontributory.  Physical Exam: BP 122/83 (BP Location: Left Arm, Patient Position: Sitting, Cuff Size: Large)   Pulse 84   Temp 97.8 F (36.6 C) (Temporal)   Ht 5' 6 (1.676 m)   Wt 207 lb 4.8 oz (94 kg)   LMP 01/25/2024   BMI 33.46 kg/m  General:   Alert and oriented. No distress noted. Pleasant and cooperative.  Head:  Normocephalic and atraumatic. Eyes:  Conjuctiva clear without scleral icterus. Mouth:  Oral mucosa pink and moist. Good dentition. No lesions. Heart: Normal rate and rhythm, s1 and s2 heart sounds present.  Lungs: Clear lung sounds in all lobes. Respirations equal and unlabored. Abdomen:  +BS, soft, non-tender and  non-distended. No rebound or guarding. No HSM or masses noted. Derm: No palmar erythema or jaundice Msk:  Symmetrical without gross deformities. Normal posture. Extremities:  Without edema. Neurologic:  Alert and  oriented x4 Psych:  Alert and cooperative. Normal mood and affect.  Invalid input(s): 6 MONTHS   ASSESSMENT: [name] is a 38 y.o. female presenting today for follow up of constipation, rectal bleeding from hemorrhoids   constipation much improved on miralax  daily, metamucil daily and linzess  145mcg. No further abdominal pain. Usually has a BM atleast once daily, will occasionally repeat miralax   dose but not often. No further rectal bleeding, feels hemorrhoids improved with bowel regimen and anusol . For now will continue with current bowel regimen, good water intake, high fiber diet, avoiding straining and limit toilet time.   PLAN:  -continue linzess  145mcg daily -continue miralax  and metamucil daily -Increase water intake, aim for atleast 64 oz per day -Increase fruits, veggies and whole grains, kiwi and prunes are especially good for constipation -Avoid straining, limit toilet time   All questions were answered, patient verbalized understanding and is in agreement with plan as outlined above.   Follow Up: 1 year   Danila Eddie L. Neshawn Aird, MSN, APRN, AGNP-C Adult-Gerontology Nurse Practitioner [facility]
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
## 2024-02-27 ENCOUNTER — Encounter: Payer: Self-pay | Admitting: Obstetrics & Gynecology

## 2024-02-28 ENCOUNTER — Encounter: Admitting: Obstetrics & Gynecology

## 2024-02-29 ENCOUNTER — Other Ambulatory Visit (HOSPITAL_COMMUNITY): Payer: Self-pay | Admitting: Psychiatry

## 2024-02-29 ENCOUNTER — Encounter (HOSPITAL_COMMUNITY): Payer: Self-pay | Admitting: Psychiatry

## 2024-02-29 ENCOUNTER — Telehealth (HOSPITAL_BASED_OUTPATIENT_CLINIC_OR_DEPARTMENT_OTHER): Admitting: Psychiatry

## 2024-02-29 DIAGNOSIS — F411 Generalized anxiety disorder: Secondary | ICD-10-CM | POA: Diagnosis not present

## 2024-02-29 DIAGNOSIS — F603 Borderline personality disorder: Secondary | ICD-10-CM | POA: Diagnosis not present

## 2024-02-29 DIAGNOSIS — F4310 Post-traumatic stress disorder, unspecified: Secondary | ICD-10-CM | POA: Diagnosis not present

## 2024-02-29 MED ORDER — LAMOTRIGINE 100 MG PO TABS: 200.0000 mg | ORAL_TABLET | Freq: Every day | ORAL | 0 refills | Status: AC

## 2024-02-29 MED ORDER — VORTIOXETINE HBR 20 MG PO TABS: 20.0000 mg | ORAL_TABLET | Freq: Every day | ORAL | 0 refills | Status: AC

## 2024-03-03 ENCOUNTER — Encounter: Payer: Self-pay | Admitting: Adult Health

## 2024-03-03 DIAGNOSIS — R519 Headache, unspecified: Secondary | ICD-10-CM

## 2024-03-03 DIAGNOSIS — G4719 Other hypersomnia: Secondary | ICD-10-CM

## 2024-03-03 DIAGNOSIS — I10 Essential (primary) hypertension: Secondary | ICD-10-CM

## 2024-03-03 DIAGNOSIS — G43711 Chronic migraine without aura, intractable, with status migrainosus: Secondary | ICD-10-CM

## 2024-03-03 DIAGNOSIS — E785 Hyperlipidemia, unspecified: Secondary | ICD-10-CM

## 2024-03-05 NOTE — Patient Instructions (Signed)
 [name]  03/05/2024     @PREFPERIOPPHARMACY @   Your procedure is scheduled on  03/13/2024.   Report [facility] at  0600 A.M.   Call this number if you have problems the morning of surgery:  [phone]  If you experience any cold or flu symptoms such as cough, fever, chills, shortness of breath, etc. between now and your scheduled surgery, please notify us  at the above number.   Remember:         Your last dose of semaglutide  should be on 03/05/2024.    Do not eat after midnight.   You may drink clear liquids until 0330 am on 03/13/2024.         Clear liquids allowed are:                    Water, Carbonated beverages (diabetics please choose diet or no sugar options), Black Coffee Only (No creamer, milk or cream, including half & half and powdered creamer), and Clear Sports drink (No red color; diabetics please choose diet or no sugar options)    Take these medicines the morning of surgery with A SIP OF WATER         gabapentin , hydrocodone  (if needed), lamictal , propranolol , maxalt , trintellix .    Do not wear jewelry, make-up or nail polish, including gel polish,  artificial nails, or any other type of covering on natural nails (fingers and  toes).  Do not wear lotions, powders, or perfumes, or deodorant.  Do not shave 48 hours prior to surgery.  Men may shave face and neck.  Do not bring valuables to the hospital.  [facility] is not responsible for any belongings or valuables.  Contacts, dentures or bridgework may not be worn into surgery.  Leave your suitcase in the car.  After surgery it may be brought to your room.  For patients admitted to the hospital, discharge time will be determined by your treatment team.  Patients discharged the day of surgery will not be allowed to drive home and must have someone with them for 24 hours.    Special instructions:  DO NOT smoke tobacco or vape for 24 hours before your procedure.  Please read over the  following fact sheets that you were given. Pain Booklet, Coughing and Deep Breathing, Blood Transfusion Information, Surgical Site Infection Prevention, Anesthesia Post-op Instructions, and Care and Recovery After Surgery       Laparoscopically Assisted Vaginal Hysterectomy, Care After After a LAVH, it's common to have soreness and numbness in your incision areas. You'll have pain in your belly. You may also have: Bleeding and discharge from the vagina. Tiredness. Sadness and other emotions. If your ovaries were taken out, you may also have symptoms of menopause, such as hot flashes, night sweats, and lack of sleep. Follow these instructions at home: Medicines Take over-the-counter and prescription medicines only as told by your health care provider. If you were given antibiotics, take them as told by your provider. Do not stop taking the antibiotic even if you start to feel better. Ask your provider if the medicine prescribed to you: Requires you to avoid driving or using machinery. Can cause constipation. You may need to take these actions to prevent or treat constipation: Drink enough fluid to keep your pee (urine) pale yellow. Take over-the-counter or prescription medicines. Eat foods that are high in fiber, such as beans, whole grains, and fresh fruits and vegetables. Limit foods  that are high in fat and processed sugars, such as fried or sweet foods. Incision care  Follow instructions from your provider about how to take care of your incisions. Make sure you: Wash your hands with soap and water for at least 20 seconds before and after you change your bandage. If soap and water aren't available, use hand sanitizer. Change your dressing as told by your provider. Leave stitches, skin glue, or tape strips in place. These skin closures may need to stay in place for 2 weeks or longer. If tape strip edges start to loosen and curl up, you may trim the loose edges. Do not remove tape strips  completely unless your provider tells you to do that. Check your incision areas every day for signs of infection. Check for: More redness, swelling, or pain. More fluid or blood. Warmth. Pus or a bad smell. Activity  Rest as told by your provider. Do not sit for a long time without moving. Get up to take short walks every 1-2 hours. This will improve blood flow and breathing. Ask for help if you feel weak or unsteady. You may have to avoid lifting. Ask your provider how much you can safely lift. Return to your normal activities as told by your provider. Ask your provider what activities are safe for you. Lifestyle Do not use any products that contain nicotine or tobacco. These products include cigarettes, chewing tobacco, and vaping devices, such as e-cigarettes. These can delay healing after surgery. If you need help quitting, ask your provider. Do not drink alcohol  until your provider approves. General instructions Do not douche, use tampons, have sex, or put anything in the vagina for at least 6 weeks. If you struggle with physical or emotional changes after your procedure, speak with your provider or a therapist. Do not take baths, swim, or use a hot tub until your provider approves. Ask your provider if you may take showers. You may only be allowed to take sponge baths. Try to have someone at home with you for the first 1-2 weeks to help with your daily chores. Wear compression stockings as told by your provider. These stockings help to prevent blood clots and reduce swelling in your legs. Your provider may give you more instructions. Make sure you know what you can and can't do. Contact a health care provider if: You have any signs of infection. You have pain and your pain medicine doesn't help. You feel dizzy or light-headed. You have trouble peeing. You vomit or feel like you may vomit, and the symptoms do not go away. You have pus or discharge from your vagina that smells  bad. Get help right away if: You have a fever and your symptoms suddenly get worse. You have very bad pain in the abdomen. You have chest pain or shortness of breath. You faint. You have pain, swelling, or redness in your leg. You have heavy bleeding in your vagina that soaks through a pad in less than 1 hour. You see blood clots in your bleeding. These symptoms may be an emergency. Get help right away. Call 911. Do not wait to see if the symptoms will go away. Do not drive yourself to the hospital. This information is not intended to replace advice given to you by your health care provider. Make sure you discuss any questions you have with your health care provider. Document Revised: 07/08/2022 Document Reviewed: 07/08/2022 Elsevier Patient Education  2024 [employer]General Anesthesia, Adult, Care After The following information offers  guidance on how to care for yourself after your procedure. Your health care provider may also give you more specific instructions. If you have problems or questions, contact your health care provider. What can I expect after the procedure? After the procedure, it is common for people to: Have pain or discomfort at the IV site. Have nausea or vomiting. Have a sore throat or hoarseness. Have trouble concentrating. Feel cold or chills. Feel weak, sleepy, or tired (fatigue). Have soreness and body aches. These can affect parts of the body that were not involved in surgery. Follow these instructions at home: For the time period you were told by your health care provider:  Rest. Do not participate in activities where you could fall or become injured. Do not drive or use machinery. Do not drink alcohol . Do not take sleeping pills or medicines that cause drowsiness. Do not make important decisions or sign legal documents. Do not take care of children on your own. General instructions Drink enough fluid to keep your urine pale yellow. If you have sleep  apnea, surgery and certain medicines can increase your risk for breathing problems. Follow instructions from your health care provider about wearing your sleep device: Anytime you are sleeping, including during daytime naps. While taking prescription pain medicines, sleeping medicines, or medicines that make you drowsy. Return to your normal activities as told by your health care provider. Ask your health care provider what activities are safe for you. Take over-the-counter and prescription medicines only as told by your health care provider. Do not use any products that contain nicotine or tobacco. These products include cigarettes, chewing tobacco, and vaping devices, such as e-cigarettes. These can delay incision healing after surgery. If you need help quitting, ask your health care provider. Contact a health care provider if: You have nausea or vomiting that does not get better with medicine. You vomit every time you eat or drink. You have pain that does not get better with medicine. You cannot urinate or have bloody urine. You develop a skin rash. You have a fever. Get help right away if: You have trouble breathing. You have chest pain. You vomit blood. These symptoms may be an emergency. Get help right away. Call 911. Do not wait to see if the symptoms will go away. Do not drive yourself to the hospital. Summary After the procedure, it is common to have a sore throat, hoarseness, nausea, vomiting, or to feel weak, sleepy, or fatigue. For the time period you were told by your health care provider, do not drive or use machinery. Get help right away if you have difficulty breathing, have chest pain, or vomit blood. These symptoms may be an emergency. This information is not intended to replace advice given to you by your health care provider. Make sure you discuss any questions you have with your health care provider. Document Revised: 06/25/2021 Document Reviewed: 06/25/2021 Elsevier  Patient Education  2024 [employer]How to Use Chlorhexidine  at Home in the Shower Chlorhexidine  gluconate (CHG) is a germ-killing (antiseptic) wash that's used to clean the skin. It can get rid of the germs that normally live on the skin and can keep them away for about 24 hours. If you're having surgery, you may be told to shower with CHG at home the night before surgery. This can help lower your risk for infection. To use CHG wash in the shower, follow the steps below. Supplies needed: CHG body wash. Clean washcloth. Clean towel. How to use CHG in the  shower Follow these steps unless you're told to use CHG in a different way: Start the shower. Use your normal soap and shampoo to wash your face and hair. Turn off the shower or move out of the shower stream. Pour CHG onto a clean washcloth. Do not use any type of brush or rough sponge. Start at your neck, washing your body down to your toes. Make sure you: Wash the part of your body where the surgery will be done for at least 1 minute. Do not scrub. Do not use CHG on your head or face unless your health care provider tells you to. If it gets into your ears or eyes, rinse them well with water. Do not wash your genitals with CHG. Wash your back and under your arms. Make sure to wash skin folds. Let the CHG sit on your skin for 1-2 minutes or as long as told. Rinse your entire body in the shower, including all body creases and folds. Turn off the shower. Dry off with a clean towel. Do not put anything on your skin afterward, such as powder, lotion, or perfume. Put on clean clothes or pajamas. If it's the night before surgery, sleep in clean sheets. General tips Use CHG only as told, and follow the instructions on the label. Use the full amount of CHG as told. This is often one bottle. Do not smoke and stay away from flames after using CHG. Your skin may feel sticky after using CHG. This is normal. The sticky feeling will go away as the  CHG dries. Do not use CHG: If you have a chlorhexidine  allergy or have reacted to chlorhexidine  in the past. On open wounds or areas of skin that have broken skin, cuts, or scrapes. On babies younger than 60 months of age. Contact a health care provider if: You have questions about using CHG. Your skin gets irritated or itchy. You have a rash after using CHG. You swallow any CHG. Call your local poison control center [phone] in the [place] Your eyes itch badly, or they become very red or swollen. Your hearing changes. You have trouble seeing. If you can't reach your provider, go to an urgent care or emergency room. Do not drive yourself. Get help right away if: You have swelling or tingling in your mouth or throat. You make high-pitched whistling sounds when you breathe, most often when you breathe out (wheeze). You have trouble breathing. These symptoms may be an emergency. Call 911 right away. Do not wait to see if the symptoms will go away. Do not drive yourself to the hospital. This information is not intended to replace advice given to you by your health care provider. Make sure you discuss any questions you have with your health care provider. Document Revised: 10/11/2022 Document Reviewed: 10/07/2021 Elsevier Patient Education  2024 [employer].

## 2024-03-06 MED ORDER — ROSUVASTATIN CALCIUM 40 MG PO TABS: 40.0000 mg | ORAL_TABLET | Freq: Every day | ORAL | 1 refills | Status: AC

## 2024-03-06 MED ORDER — CANDESARTAN CILEXETIL 8 MG PO TABS: 8.0000 mg | ORAL_TABLET | Freq: Every day | ORAL | 2 refills | Status: AC

## 2024-03-11 ENCOUNTER — Encounter (HOSPITAL_COMMUNITY)
Admission: RE | Admit: 2024-03-11 | Discharge: 2024-03-11 | Disposition: A | Source: Ambulatory Visit | Attending: Obstetrics & Gynecology

## 2024-03-11 ENCOUNTER — Encounter (HOSPITAL_COMMUNITY): Payer: Self-pay

## 2024-03-11 ENCOUNTER — Other Ambulatory Visit: Payer: Self-pay

## 2024-03-11 ENCOUNTER — Ambulatory Visit: Payer: Self-pay | Admitting: Obstetrics & Gynecology

## 2024-03-11 ENCOUNTER — Other Ambulatory Visit (HOSPITAL_COMMUNITY): Payer: Self-pay

## 2024-03-11 VITALS — BP 122/83 | HR 84 | Resp 16 | Ht 66.0 in | Wt 207.3 lb

## 2024-03-11 DIAGNOSIS — N939 Abnormal uterine and vaginal bleeding, unspecified: Secondary | ICD-10-CM | POA: Insufficient documentation

## 2024-03-11 DIAGNOSIS — Z01812 Encounter for preprocedural laboratory examination: Secondary | ICD-10-CM | POA: Diagnosis not present

## 2024-03-11 DIAGNOSIS — Z01818 Encounter for other preprocedural examination: Secondary | ICD-10-CM

## 2024-03-11 HISTORY — DX: Other specified postprocedural states: Z98.890

## 2024-03-11 HISTORY — DX: Insomnia, unspecified: G47.00

## 2024-03-11 HISTORY — DX: Bipolar disorder, unspecified: F31.9

## 2024-03-11 HISTORY — DX: Vitamin D deficiency, unspecified: E55.9

## 2024-03-11 HISTORY — DX: Nausea with vomiting, unspecified: R11.2

## 2024-03-11 LAB — CBC
HCT: 35.5 % — ABNORMAL LOW (ref 36.0–46.0)
Hemoglobin: 11.7 g/dL — ABNORMAL LOW (ref 12.0–15.0)
MCH: 31.8 pg (ref 26.0–34.0)
MCHC: 33 g/dL (ref 30.0–36.0)
MCV: 96.5 fL (ref 80.0–100.0)
Platelets: 228 K/uL (ref 150–400)
RBC: 3.68 MIL/uL — ABNORMAL LOW (ref 3.87–5.11)
RDW: 12.7 % (ref 11.5–15.5)
WBC: 3.9 K/uL — ABNORMAL LOW (ref 4.0–10.5)
nRBC: 0 % (ref 0.0–0.2)

## 2024-03-11 LAB — COMPREHENSIVE METABOLIC PANEL WITH GFR
ALT: 24 U/L (ref 0–44)
AST: 20 U/L (ref 15–41)
Albumin: 4.5 g/dL (ref 3.5–5.0)
Alkaline Phosphatase: 69 U/L (ref 38–126)
Anion gap: 9 (ref 5–15)
BUN: 7 mg/dL (ref 6–20)
CO2: 26 mmol/L (ref 22–32)
Calcium: 9.4 mg/dL (ref 8.9–10.3)
Chloride: 107 mmol/L (ref 98–111)
Creatinine, Ser: 0.98 mg/dL (ref 0.44–1.00)
GFR, Estimated: >60 mL/min (ref >60)
Glucose, Bld: 80 mg/dL (ref 70–99)
Potassium: 3.7 mmol/L (ref 3.5–5.1)
Sodium: 142 mmol/L (ref 135–145)
Total Bilirubin: 0.5 mg/dL (ref 0.0–1.2)
Total Protein: 6.4 g/dL — ABNORMAL LOW (ref 6.5–8.1)

## 2024-03-11 LAB — TYPE AND SCREEN
ABO/RH(D): O POS
Antibody Screen: NEGATIVE

## 2024-03-11 LAB — PREGNANCY, URINE: Preg Test, Ur: NEGATIVE

## 2024-03-11 NOTE — H&P (Signed)
 [facility] Attending History and Physical  [name] is a 38 y.o. [id number] who presents for scheduled robotic assisted hysterectomy, bilateral salpingectomy, possible left cystectomy and possible cystoscopy due to abnormal uterine bleeding and ovarian cyst.  In review, menses are every month, not always around the same time but close.  Typically last for about 4-10 days. She will have about 4 heavy days using 3-4 overnight pads per day with passage of orange-sized clots.  +dysmenorrhea.  Initially sent to OB/GYN due to incidental left ovarian cyst seen on renal US - cyst thought to be benign likely hemorrhagic cyst.  Today she notes no acute complaints or changes.  Denies any abnormal vaginal discharge, fevers, chills, sweats, dysuria, nausea, vomiting, other GI or GU symptoms or other general symptoms.  Past Medical History:  Diagnosis Date   Allergy 1989   Anemia 2008   Anxiety    Borderline personality disorder (HCC)    Headache    History of kidney stones    Hydradenitis    Hyperlipidemia 09/25/2021   Hypertension 01/2023   Hypertension in pregnancy    Knee pain    Nephrolithiasis    Postpartum depression    Preeclampsia    Sleep apnea 04/2023   Past Surgical History:  Procedure Laterality Date   APPENDECTOMY  2006   CESAREAN SECTION     COLONOSCOPY N/A 01/05/2024   Procedure: COLONOSCOPY;  Surgeon: Cinderella Deatrice FALCON, MD;  Location: [facility];  Service: Endoscopy;  Laterality: N/A;  11;00 am, asa 1-2   CYSTOSCOPY/URETEROSCOPY/HOLMIUM LASER/STENT PLACEMENT Right 04/16/2021   Procedure: CYSTOSCOPY RIGHT RETROGRADE PYELOGRAM  URETEROSCOPY/HOLMIUM LASER/STENT PLACEMENT;  Surgeon: Watt Rush, MD;  Location: [facility];  Service: Urology;  Laterality: Right;   CYSTOSCOPY/URETEROSCOPY/HOLMIUM LASER/STENT PLACEMENT Right 01/23/2024   Procedure: CYSTOSCOPY, URETEROSCOPY, RIGHT RETROGRADE PYELOGRAM, BASKET EXTRACTION, RIGHT URETERAL  STENT PLACEMENT;  Surgeon: Watt Rush, MD;  Location: [facility];  Service: Urology;  Laterality: Right;   HOLMIUM LASER APPLICATION Right 04/16/2021   Procedure: HOLMIUM LASER APPLICATION;  Surgeon: Watt Rush, MD;  Location: [facility];  Service: Urology;  Laterality: Right;   KIDNEY STONE SURGERY  04/16/2021   r-kidney   KNEE SURGERY Right    scoped x2   MEDIAL PATELLOFEMORAL LIGAMENT REPAIR Right 05/04/2023   Procedure: MEDIAL PATELLA FEMORAL LIGAMENT RECONSTRUCTION WITH OSTEOCHONDRAL AUTOGRAFT;  Surgeon: Cristy Bonner DASEN, MD;  Location: [facility];  Service: Orthopedics;  Laterality: Right;   TUBAL LIGATION  2017   WISDOM TOOTH EXTRACTION  2010   OB History  Gravida Para Term Preterm AB Living  3 2 1 1 1 2   SAB IAB Ectopic Multiple Live Births  1    2    # Outcome Date GA Lbr Len/2nd Weight Sex Type Anes PTL Lv  3 Term 02/23/16 [redacted]w[redacted]d  2722 g F CS-Unspec Spinal N LIV  2 Preterm 02/11/08 [redacted]w[redacted]d  2115 g F CS-Unspec Spinal N LIV     Birth Comments: Pre-eclampsia     Complications: Pre-eclampsia  1 SAB 2008 [redacted]w[redacted]d         Patient denies any other pertinent gynecologic issues.  No current facility-administered medications on file prior to encounter.   Current Outpatient Medications on File Prior to Encounter  Medication Sig Dispense Refill   clindamycin  (CLEOCIN  T) 1 % SWAB Apply 1 Swab topically 2 (two) times daily as needed (skin blemishes).     doxycycline  (VIBRAMYCIN ) 100 MG capsule Take 100 mg by  mouth daily.     Fremanezumab -vfrm (AJOVY ) 225 MG/1.5ML SOAJ Inject 225 mg into the skin every 30 (thirty) days. 1.5 mL 11   gabapentin  (NEURONTIN ) 300 MG capsule Take 1 capsule (300 mg total) by mouth 4 (four) times daily. 120 capsule 2   HYDROcodone -acetaminophen  (NORCO/VICODIN) 5-325 MG tablet Take 1 tablet by mouth every 6 (six) hours as needed. 12 tablet 0   linaclotide  (LINZESS ) 145 MCG CAPS capsule Take 1 capsule (145 mcg total) by mouth daily. 90 capsule 1    ondansetron  (ZOFRAN -ODT) 4 MG disintegrating tablet 4mg  ODT q4 hours prn nausea/vomit 20 tablet 0   polyethylene glycol (MIRALAX  / GLYCOLAX ) 17 g packet Take 17 g by mouth daily.     promethazine  (PHENERGAN ) 12.5 MG tablet Take 1 tablet (12.5 mg total) by mouth every 6 (six) hours as needed for nausea or vomiting. 30 tablet 0   propranolol  (INDERAL ) 10 MG tablet Take 1 tablet (10 mg total) by mouth 3 (three) times daily as needed. 90 tablet 2   psyllium (METAMUCIL) 58.6 % packet Take 1 packet by mouth daily.     rizatriptan  (MAXALT -MLT) 10 MG disintegrating tablet Take 1 tablet (10 mg total) by mouth as needed for migraine. May repeat in 2 hours if needed 9 tablet 11   Semaglutide -Weight Management (WEGOVY ) 2.4 MG/0.75ML SOAJ Inject 2.4 mg into the skin once a week. 9 mL 4   traZODone  (DESYREL ) 100 MG tablet Take 1 tablet (100 mg total) by mouth at bedtime. 90 tablet 0   Vitamin D , Ergocalciferol , (DRISDOL ) 1.25 MG (50000 UNIT) CAPS capsule Take 1 capsule (50,000 Units total) by mouth every 7 (seven) days. 5 capsule 5   rosuvastatin  (CRESTOR ) 40 MG tablet Take 1 tablet (40 mg total) by mouth daily. 90 tablet 1   Allergies  Allergen Reactions   Hydroquinone Anaphylaxis, Shortness Of Breath and Swelling    Any bleach products; Facial swelling   Sodium Hypochlorite Shortness Of Breath    Any bleach products    Keflex [Cephalexin] Rash   Latex Rash    Social History:   reports that she has never smoked. She has been exposed to tobacco smoke. She has never used smokeless tobacco. She reports that she does not currently use drugs. She reports that she does not drink alcohol . Family History  Problem Relation Age of Onset   Migraines Mother    Hypertension Mother    COPD Mother    Cancer Mother    Hyperlipidemia Mother    Obesity Mother    Vision loss Mother    COPD Father    Drug abuse Father    Hyperlipidemia Father    Vision loss Father    Migraines Maternal Grandmother    Stroke  Maternal Grandmother    Anxiety disorder Maternal Grandmother    Arthritis Maternal Grandmother    Hyperlipidemia Maternal Grandmother    Hypertension Maternal Grandmother    Obesity Maternal Grandmother    Vision loss Maternal Grandmother    Diabetes Maternal Grandfather    Hypertension Maternal Grandfather    Arthritis Maternal Grandfather    COPD Maternal Grandfather    Hearing loss Maternal Grandfather    Heart disease Maternal Grandfather    Hyperlipidemia Maternal Grandfather    Vision loss Maternal Grandfather    Vision loss Paternal Grandfather    ADD / ADHD Daughter    Asthma Daughter    Vision loss Daughter    Alcohol  abuse Maternal Aunt    Arthritis Maternal Aunt  Drug abuse Maternal Aunt    Early death Maternal Aunt    Miscarriages / Stillbirths Maternal Aunt    Vision loss Maternal Aunt    Alcohol  abuse Maternal Uncle    Anxiety disorder Maternal Uncle    Depression Maternal Uncle    Drug abuse Maternal Uncle    Vision loss Maternal Uncle    Arthritis Paternal Uncle    Depression Paternal Uncle    Vision loss Paternal Uncle    Vision loss Sister    ADD / ADHD Daughter    Asthma Daughter    Vision loss Daughter    Alcohol  abuse Maternal Aunt    Arthritis Maternal Aunt    Drug abuse Maternal Aunt    Early death Maternal Aunt    Miscarriages / Stillbirths Maternal Aunt    Vision loss Maternal Aunt    Alcohol  abuse Maternal Uncle    Anxiety disorder Maternal Uncle    Depression Maternal Uncle    Drug abuse Maternal Uncle    Vision loss Maternal Uncle    Arthritis Paternal Uncle    Depression Paternal Uncle    Vision loss Paternal Uncle    Vision loss Sister     Review of Systems: Pertinent items noted in HPI and remainder of comprehensive ROS otherwise negative.  PHYSICAL EXAM: Last menstrual period 01/25/2024. BP 108/80   Pulse 79   Temp 98.2 F (36.8 C) (Oral)   Resp 20   Ht 5' 6 (1.676 m)   Wt 93.9 kg   LMP 01/25/2024   SpO2 99%   BMI  33.41 kg/m   CONSTITUTIONAL: Well-developed, well-nourished female in no acute distress.  SKIN: Skin is warm and dry. No rash noted. Not diaphoretic. No erythema. No pallor. NEUROLOGIC: Alert and oriented to person, place, and time. Normal reflexes, muscle tone coordination. No cranial nerve deficit noted. PSYCHIATRIC: Normal mood and affect. Normal behavior. Normal judgment and thought content. CARDIOVASCULAR: Normal heart rate noted, regular rhythm RESPIRATORY: Effort and breath sounds normal, no problems with respiration noted ABDOMEN: Soft, nontender, nondistended. PELVIC: deferred MUSCULOSKELETAL: no calf tenderness bilaterally EXT: no edema bilaterally, normal pulses  Labs: Results for orders placed or performed during the hospital encounter of 03/13/24 (from the past 24 hours)  ABO/Rh     Status: None   Collection Time: 03/13/24  5:00 AM  Result Value Ref Range   ABO/RH(D)      O POS Performed at [facility], [address], [place], [place] [address]      Imaging Studies: US  PELVIC COMPLETE WITH TRANSVAGINAL Result Date: 02/20/2024 Images from the original result were not included.  ..[employer] [facility]tras[facility] TECHNICAL SALES ENGINEER) accredited practice Center for [facility] [address] [place] [address] Ordering Provider: Yassir Enis, DO                                           GYNECOLOGIC SONOGRAM EXAM: TRANSABDOMINAL AND TRANSVAGINAL ULTRASOUND OF PELVIS Chaperone: Maurilio  TECHNIQUE: Transabdominal and transvaginal ultrasound examinations of the pelvis were performed. Transabdominal technique was performed for global imaging of the pelvis including uterus, ovaries, adnexal regions, and pelvic cul-de-sac. It was necessary to proceed with endovaginal exam following the transabdominal exam to better visualize and improve detailed examination of  the uterus, endometrium and bilateral ovaries, as well as, evaluation of ovarian blood flow and  sliding  characteristics. [name] is a 38 y.o. [phone] Patient's last menstrual period was 01/25/2024. for a pelvic sonogram for ovarian cyst seen on outside imaging. Uterus                      6.7 x 5.5 x 7.4 cm, Total uterine volume 145.2 cc  Anteverted uterus, slightly enlarged. symmetrical, homogeneous myometrium with single intramural fibroid mid posterior wall = 43 x 32 x 43 mm Endometrium          6.6 mm, symmetrical, uniform avascular cavity and canal, no evidence of intracavitary defects Right ovary             3.75 x 2.0 x 2.9 cm, mobile, appears normal Left ovary                7.3 x 5.4 x 4.9 cm, mobile, seen with avascular cyst with thin, smoothly marginated walls measuring 7 x 4.4 x 4.5 cm, internal reticular echopattern with low level echoes suggests hemorrhage.  Normal blood flow to ovary.   ? Hemorrhagic cyst  vs  endometrioma (53 mm average) neg adnexal regions  -  neg CDS, no free fluid present Technician Comments: GYN US : TA and TV imaging performed - vinyl probe cover used - Chaperone:  Emma Anteverted uterus, upper limits of normal in size. symmetrical, homogeneous myometrium with single intramural fibroid mid posterior wall = 43 x 32 x 43 mm Endom thickness = 6.6 mm, uniform avascular cavity and canal, no evidence of intracavitary defects The Right ovary appears normal and mobile,  The Left ovary is seen with avascular cyst with thin, smoothly marginated walls measuring 70 x 44 x 45 mm, internal reticular echopattern with low level echoes suggests hemorrhage.  Normal blood flow to ovary.  L ov appears mobile.  ? Hemorrhagic cyst  vs  endometrioma. neg adnexal regions  -  neg CDS, no free fluid present [name] Fair 02/20/2024 12:06 PM Clinical Impression and recommendations: I have reviewed the sonogram results above, combined with the patient's current clinical course, below are my impressions and any appropriate recommendations for management based on the sonographic findings. Slightly  enlarged uterus with single posterior intramural fibroid measuring 4.3 x 3.2 x 4.3 cm. Normal endometrium Normal right ovary.  Left ovary with smooth walled 7 x 4.4 x 4.5 cm cyst with reticular pattern suggestive of hemorrhagic cyst, cannot rule out endometrioma. Based on the US  findings, O-RADS Cat 2 almost certainly benign.  Based on size, would recommend follow up US  in 2-3 mos for re-evaluation. (O-RADS US  Risk Stratification and Management System: A Consensus Guidelines from the ACR Ovarian-Adnexal Reporting and Data System, Radiology 2020; [id number]) [name] 02/20/2024 7:04 PM     Assessment: Abnormal/heavy menstrual bleeding Pelvic pain Ovarian cyst   Plan: Robotic assisted hysterectomy, bilateral salpingectomy, possible ovarian cystectomy, possible cystoscopy due to abnormal uterine bleeding and ovarian cyst. -IV Gent/Clindamycin  -IV Toradol  -NPO -LR @ 125cc/hr -SCDs to OR -Risk/benefits and alternatives reviewed with the patient including but not limited to risk of bleeding, infection and injury to surrounding organs requiring further surgical intervention.  See prior note for full review of informed consent. Also discussed potential conversion to open procedure.  Questions and concerns were addressed and pt desires to proceed  Azhar Yogi, DO Attending Obstetrician & Gynecologist, [facility], [facility]

## 2024-03-12 ENCOUNTER — Other Ambulatory Visit (HOSPITAL_COMMUNITY): Payer: Self-pay

## 2024-03-12 ENCOUNTER — Telehealth: Payer: Self-pay

## 2024-03-12 MED ORDER — CLINDAMYCIN PHOSPHATE 900 MG/50ML IV SOLN
900.0000 mg | INTRAVENOUS | Status: AC
  Administered 2024-03-13: 900 mg via INTRAVENOUS
  Filled 2024-03-12: qty 50

## 2024-03-12 MED ORDER — GENTAMICIN SULFATE 40 MG/ML IJ SOLN
5.0000 mg/kg | INTRAVENOUS | Status: AC
  Administered 2024-03-13: 370 mg via INTRAVENOUS
  Filled 2024-03-12: qty 9.25

## 2024-03-12 NOTE — Telephone Encounter (Signed)
 Pharmacy Patient Advocate Encounter   Received notification from Fax that prior authorization for Candesartan  is required/requested.   Insurance verification completed.   The patient is insured through [facility] MEDICAID.   Per test claim: PA required; PA submitted to above mentioned insurance via Latent Key/confirmation #/EOC [id number] Status is pending

## 2024-03-13 ENCOUNTER — Ambulatory Visit (HOSPITAL_COMMUNITY)
Admission: RE | Admit: 2024-03-13 | Discharge: 2024-03-13 | Disposition: A | Discharge status: Home / Self Care | Attending: Obstetrics & Gynecology | Admitting: Obstetrics & Gynecology

## 2024-03-13 ENCOUNTER — Ambulatory Visit (HOSPITAL_COMMUNITY)

## 2024-03-13 ENCOUNTER — Encounter: Payer: Self-pay | Admitting: Obstetrics & Gynecology

## 2024-03-13 ENCOUNTER — Encounter (HOSPITAL_COMMUNITY)
Admission: RE | Disposition: A | Discharge status: Home / Self Care | Payer: Self-pay | Source: Home / Self Care | Attending: Obstetrics & Gynecology

## 2024-03-13 ENCOUNTER — Encounter (HOSPITAL_COMMUNITY): Payer: Self-pay | Admitting: Obstetrics & Gynecology

## 2024-03-13 ENCOUNTER — Other Ambulatory Visit: Payer: Self-pay

## 2024-03-13 DIAGNOSIS — Z79899 Other long term (current) drug therapy: Secondary | ICD-10-CM | POA: Diagnosis not present

## 2024-03-13 DIAGNOSIS — F319 Bipolar disorder, unspecified: Secondary | ICD-10-CM | POA: Insufficient documentation

## 2024-03-13 DIAGNOSIS — N838 Other noninflammatory disorders of ovary, fallopian tube and broad ligament: Secondary | ICD-10-CM | POA: Diagnosis not present

## 2024-03-13 DIAGNOSIS — I10 Essential (primary) hypertension: Secondary | ICD-10-CM | POA: Diagnosis not present

## 2024-03-13 DIAGNOSIS — G4730 Sleep apnea, unspecified: Secondary | ICD-10-CM | POA: Insufficient documentation

## 2024-03-13 DIAGNOSIS — N736 Female pelvic peritoneal adhesions (postinfective): Secondary | ICD-10-CM | POA: Diagnosis not present

## 2024-03-13 DIAGNOSIS — R519 Headache, unspecified: Secondary | ICD-10-CM | POA: Insufficient documentation

## 2024-03-13 DIAGNOSIS — D649 Anemia, unspecified: Secondary | ICD-10-CM | POA: Insufficient documentation

## 2024-03-13 DIAGNOSIS — N83202 Unspecified ovarian cyst, left side: Secondary | ICD-10-CM | POA: Diagnosis not present

## 2024-03-13 DIAGNOSIS — N920 Excessive and frequent menstruation with regular cycle: Secondary | ICD-10-CM | POA: Diagnosis not present

## 2024-03-13 DIAGNOSIS — N72 Inflammatory disease of cervix uteri: Secondary | ICD-10-CM

## 2024-03-13 DIAGNOSIS — Z01818 Encounter for other preprocedural examination: Secondary | ICD-10-CM

## 2024-03-13 DIAGNOSIS — N939 Abnormal uterine and vaginal bleeding, unspecified: Secondary | ICD-10-CM | POA: Diagnosis present

## 2024-03-13 DIAGNOSIS — F419 Anxiety disorder, unspecified: Secondary | ICD-10-CM | POA: Insufficient documentation

## 2024-03-13 DIAGNOSIS — D251 Intramural leiomyoma of uterus: Secondary | ICD-10-CM

## 2024-03-13 HISTORY — PX: HYSTERECTOMY, TOTAL, LAPAROSCOPIC, ROBOT-ASSISTED WITH SALPINGECTOMY: SHX7587

## 2024-03-13 HISTORY — PX: ROBOTIC ASSISTED LAPAROSCOPIC LYSIS OF ADHESION: SHX6080

## 2024-03-13 HISTORY — PX: ROBOTIC ASSISTED LAPAROSCOPIC OVARIAN CYSTECTOMY: SHX6081

## 2024-03-13 LAB — ABO/RH: ABO/RH(D): O POS

## 2024-03-13 SURGERY — HYSTERECTOMY, TOTAL, LAPAROSCOPIC, ROBOT-ASSISTED WITH SALPINGECTOMY
Anesthesia: General | Site: Abdomen

## 2024-03-13 MED ORDER — KETOROLAC TROMETHAMINE 30 MG/ML IJ SOLN
30.0000 mg | INTRAMUSCULAR | Status: AC
  Administered 2024-03-13: 30 mg via INTRAVENOUS

## 2024-03-13 MED ORDER — HYDROMORPHONE HCL 1 MG/ML IJ SOLN
INTRAMUSCULAR | Status: AC
  Filled 2024-03-13: qty 0.5

## 2024-03-13 MED ORDER — SUGAMMADEX SODIUM 200 MG/2ML IV SOLN
INTRAVENOUS | Status: DC | PRN
  Administered 2024-03-13: 200 mg via INTRAVENOUS

## 2024-03-13 MED ORDER — HYDROMORPHONE HCL 1 MG/ML IJ SOLN
INTRAMUSCULAR | Status: DC | PRN
  Administered 2024-03-13: .5 mg via INTRAVENOUS

## 2024-03-13 MED ORDER — ONDANSETRON HCL 4 MG/2ML IJ SOLN
4.0000 mg | Freq: Once | INTRAMUSCULAR | Status: AC
  Administered 2024-03-13: 4 mg via INTRAVENOUS

## 2024-03-13 MED ORDER — ONDANSETRON HCL 4 MG/2ML IJ SOLN
INTRAMUSCULAR | Status: DC | PRN
  Administered 2024-03-13: 4 mg via INTRAVENOUS

## 2024-03-13 MED ORDER — ONDANSETRON HCL 4 MG/2ML IJ SOLN
INTRAMUSCULAR | Status: AC
  Filled 2024-03-13: qty 2

## 2024-03-13 MED ORDER — ROCURONIUM BROMIDE 10 MG/ML (PF) SYRINGE
PREFILLED_SYRINGE | INTRAVENOUS | Status: DC | PRN
  Administered 2024-03-13: 80 mg via INTRAVENOUS
  Administered 2024-03-13: 30 mg via INTRAVENOUS

## 2024-03-13 MED ORDER — BUPIVACAINE HCL (PF) 0.25 % IJ SOLN
INTRAMUSCULAR | Status: DC | PRN
  Administered 2024-03-13: 40 mL

## 2024-03-13 MED ORDER — HYDROMORPHONE HCL 1 MG/ML IJ SOLN
0.2500 mg | INTRAMUSCULAR | Status: DC | PRN
  Administered 2024-03-13 (×3): 0.5 mg via INTRAVENOUS
  Filled 2024-03-13 (×2): qty 0.5

## 2024-03-13 MED ORDER — KETOROLAC TROMETHAMINE 30 MG/ML IJ SOLN
INTRAMUSCULAR | Status: AC
  Filled 2024-03-13: qty 1

## 2024-03-13 MED ORDER — PHENYLEPHRINE 80 MCG/ML (10ML) SYRINGE FOR IV PUSH (FOR BLOOD PRESSURE SUPPORT)
PREFILLED_SYRINGE | INTRAVENOUS | Status: AC
  Filled 2024-03-13: qty 10

## 2024-03-13 MED ORDER — MIDAZOLAM HCL (PF) 2 MG/2ML IJ SOLN
INTRAMUSCULAR | Status: DC | PRN
  Administered 2024-03-13: 2 mg via INTRAVENOUS

## 2024-03-13 MED ORDER — ORAL CARE MOUTH RINSE: 15.0000 mL | Freq: Once | OROMUCOSAL | Status: AC

## 2024-03-13 MED ORDER — DOCUSATE SODIUM 100 MG PO CAPS: 100.0000 mg | ORAL_CAPSULE | Freq: Two times a day (BID) | ORAL | 0 refills | Status: AC

## 2024-03-13 MED ORDER — LIDOCAINE 2% (20 MG/ML) 5 ML SYRINGE
INTRAMUSCULAR | Status: AC
  Filled 2024-03-13: qty 5

## 2024-03-13 MED ORDER — LACTATED RINGERS IV SOLN: INTRAVENOUS | Status: DC | PRN

## 2024-03-13 MED ORDER — ROCURONIUM BROMIDE 10 MG/ML (PF) SYRINGE
PREFILLED_SYRINGE | INTRAVENOUS | Status: AC
  Filled 2024-03-13: qty 10

## 2024-03-13 MED ORDER — PHENYLEPHRINE HCL-NACL 20-0.9 MG/250ML-% IV SOLN
INTRAVENOUS | Status: DC | PRN
  Administered 2024-03-13: 25 ug/min via INTRAVENOUS

## 2024-03-13 MED ORDER — LACTATED RINGERS IV SOLN: INTRAVENOUS | Status: DC

## 2024-03-13 MED ORDER — MIDAZOLAM HCL 2 MG/2ML IJ SOLN
INTRAMUSCULAR | Status: AC
  Filled 2024-03-13: qty 2

## 2024-03-13 MED ORDER — OXYCODONE HCL 5 MG/5ML PO SOLN: 5.0000 mg | Freq: Once | ORAL | Status: DC | PRN

## 2024-03-13 MED ORDER — SCOPOLAMINE 1 MG/3DAYS TD PT72
1.0000 | MEDICATED_PATCH | Freq: Once | TRANSDERMAL | Status: DC
  Administered 2024-03-13: 1 mg via TRANSDERMAL
  Filled 2024-03-13: qty 1

## 2024-03-13 MED ORDER — CHLORHEXIDINE GLUCONATE 0.12 % MT SOLN
15.0000 mL | Freq: Once | OROMUCOSAL | Status: AC
  Administered 2024-03-13: 15 mL via OROMUCOSAL

## 2024-03-13 MED ORDER — GABAPENTIN 300 MG PO CAPS: 300.0000 mg | ORAL_CAPSULE | Freq: Three times a day (TID) | ORAL | 0 refills | Status: DC

## 2024-03-13 MED ORDER — PHENYLEPHRINE 80 MCG/ML (10ML) SYRINGE FOR IV PUSH (FOR BLOOD PRESSURE SUPPORT)
PREFILLED_SYRINGE | INTRAVENOUS | Status: DC | PRN
  Administered 2024-03-13 (×5): 160 ug via INTRAVENOUS

## 2024-03-13 MED ORDER — PHENYLEPHRINE 80 MCG/ML (10ML) SYRINGE FOR IV PUSH (FOR BLOOD PRESSURE SUPPORT)
PREFILLED_SYRINGE | INTRAVENOUS | Status: AC
  Filled 2024-03-13: qty 20

## 2024-03-13 MED ORDER — DEXAMETHASONE SOD PHOSPHATE PF 10 MG/ML IJ SOLN
INTRAMUSCULAR | Status: DC | PRN
  Administered 2024-03-13: 10 mg via INTRAVENOUS

## 2024-03-13 MED ORDER — KETOROLAC TROMETHAMINE 10 MG PO TABS: 10.0000 mg | ORAL_TABLET | Freq: Three times a day (TID) | ORAL | 0 refills | Status: AC

## 2024-03-13 MED ORDER — LIDOCAINE 2% (20 MG/ML) 5 ML SYRINGE
INTRAMUSCULAR | Status: DC | PRN
  Administered 2024-03-13: 60 mg via INTRAVENOUS

## 2024-03-13 MED ORDER — ACETAMINOPHEN 10 MG/ML IV SOLN
1000.0000 mg | Freq: Once | INTRAVENOUS | Status: AC
  Administered 2024-03-13: 1000 mg via INTRAVENOUS
  Filled 2024-03-13: qty 100

## 2024-03-13 MED ORDER — PROPOFOL 10 MG/ML IV BOLUS
INTRAVENOUS | Status: AC
  Filled 2024-03-13: qty 20

## 2024-03-13 MED ORDER — HEMOSTATIC AGENTS (NO CHARGE) OPTIME
TOPICAL | Status: DC | PRN
  Administered 2024-03-13: 1 via TOPICAL

## 2024-03-13 MED ORDER — PROPOFOL 10 MG/ML IV BOLUS
INTRAVENOUS | Status: DC | PRN
  Administered 2024-03-13: 200 mg via INTRAVENOUS

## 2024-03-13 MED ORDER — BUPIVACAINE HCL (PF) 0.25 % IJ SOLN
INTRAMUSCULAR | Status: AC
  Filled 2024-03-13: qty 60

## 2024-03-13 MED ORDER — DEXAMETHASONE SODIUM PHOSPHATE 4 MG/ML IJ SOLN
8.0000 mg | Freq: Once | INTRAMUSCULAR | Status: AC | PRN
  Administered 2024-03-13: 4 mg via INTRAVENOUS
  Filled 2024-03-13: qty 2

## 2024-03-13 MED ORDER — SODIUM CHLORIDE 0.9 % IR SOLN
Status: DC | PRN
  Administered 2024-03-13: 3000 mL

## 2024-03-13 MED ORDER — ACETAMINOPHEN 325 MG PO TABS: 650.0000 mg | ORAL_TABLET | Freq: Four times a day (QID) | ORAL | Status: AC | PRN

## 2024-03-13 MED ORDER — STERILE WATER FOR IRRIGATION IR SOLN
Status: DC | PRN
  Administered 2024-03-13: 1000 mL

## 2024-03-13 MED ORDER — OXYCODONE HCL 5 MG PO TABS: 5.0000 mg | ORAL_TABLET | Freq: Once | ORAL | Status: DC | PRN

## 2024-03-13 MED ORDER — FENTANYL CITRATE (PF) 100 MCG/2ML IJ SOLN
INTRAMUSCULAR | Status: DC | PRN
  Administered 2024-03-13: 100 ug via INTRAVENOUS
  Administered 2024-03-13 (×2): 50 ug via INTRAVENOUS

## 2024-03-13 MED ORDER — FENTANYL CITRATE (PF) 250 MCG/5ML IJ SOLN
INTRAMUSCULAR | Status: AC
  Filled 2024-03-13: qty 5

## 2024-03-13 MED ORDER — OXYCODONE HCL 5 MG PO TABS: 5.0000 mg | ORAL_TABLET | Freq: Four times a day (QID) | ORAL | 0 refills | Status: AC | PRN

## 2024-03-13 MED ORDER — POVIDONE-IODINE 10 % EX SWAB: 2.0000 | Freq: Once | CUTANEOUS | Status: DC

## 2024-03-13 SURGICAL SUPPLY — 48 items
BLADE SURG SZ11 CARB STEEL (BLADE) ×5 IMPLANT
CAUTERY HOOK MNPLR 1.6 DVNC XI (INSTRUMENTS) ×5 IMPLANT
CHLORAPREP W/TINT 26 (MISCELLANEOUS) ×5 IMPLANT
COVER LIGHT HANDLE (MISCELLANEOUS) IMPLANT
COVER LIGHT HANDLE STERIS (MISCELLANEOUS) ×10 IMPLANT
COVER MAYO STAND XLG (MISCELLANEOUS) ×5 IMPLANT
DERMABOND ADVANCED .7 DNX12 (GAUZE/BANDAGES/DRESSINGS) ×5 IMPLANT
DRAPE ARM DVNC X/XI (DISPOSABLE) ×20 IMPLANT
DRAPE COLUMN DVNC XI (DISPOSABLE) ×5 IMPLANT
DRAPE UTILITY W/TAPE 26X15 (DRAPES) ×10 IMPLANT
DRIVER NDL MEGA 8 DVNC XI (INSTRUMENTS) ×10 IMPLANT
ELECTRODE REM PT RTRN 9FT ADLT (ELECTROSURGICAL) ×5 IMPLANT
FORCEPS PROGRASP DVNC XI (FORCEP) ×5 IMPLANT
GAUZE 4X4 16PLY ~~LOC~~+RFID DBL (SPONGE) ×10 IMPLANT
GLOVE BIO SURGEON STRL SZ 6.5 (GLOVE) ×15 IMPLANT
GLOVE BIOGEL PI IND STRL 7.0 (GLOVE) ×30 IMPLANT
GOWN STRL REUS W/ TWL LRG LVL3 (GOWN DISPOSABLE) ×15 IMPLANT
GOWN STRL REUS W/TWL LRG LVL3 (GOWN DISPOSABLE) ×10 IMPLANT
KIT PINK PAD W/HEAD ARM REST (MISCELLANEOUS) ×5 IMPLANT
KIT TURNOVER CYSTO (KITS) ×5 IMPLANT
MANIFOLD NEPTUNE II (INSTRUMENTS) ×5 IMPLANT
NDL HYPO 25X1 1.5 SAFETY (NEEDLE) ×5 IMPLANT
NDL INSUFFLATION 14GA 120MM (NEEDLE) ×5 IMPLANT
NS IRRIG 500ML POUR BTL (IV SOLUTION) ×5 IMPLANT
OBTURATOR OPTICALSTD 8 DVNC (TROCAR) ×5 IMPLANT
PACK PERI GYN (CUSTOM PROCEDURE TRAY) ×5 IMPLANT
POSITIONER HEAD 8X9X4 ADT (SOFTGOODS) ×5 IMPLANT
POWDER SURGICEL 3.0 GRAM (HEMOSTASIS) ×5 IMPLANT
RUMI II GYRUS 3.5CM BLUE (DISPOSABLE) IMPLANT
SEAL UNIV 5-12 XI (MISCELLANEOUS) ×15 IMPLANT
SEALER VESSEL EXT DVNC XI (MISCELLANEOUS) ×5 IMPLANT
SET BASIN LINEN APH (SET/KITS/TRAYS/PACK) ×5 IMPLANT
SET TRI-LUMEN FLTR TB AIRSEAL (TUBING) ×5 IMPLANT
SET TUBE IRRIG SUCTION NO TIP (IRRIGATION / IRRIGATOR) ×5 IMPLANT
SOL .9 NS 3000ML IRR UROMATIC (IV SOLUTION) IMPLANT
SPONGE T-LAP 18X18 ~~LOC~~+RFID (SPONGE) IMPLANT
SUT MNCRL AB 4-0 PS2 18 (SUTURE) ×5 IMPLANT
SUT STRATA PDS 0 30 CT-2.5 (SUTURE) ×10 IMPLANT
SYR 10ML LL (SYRINGE) ×10 IMPLANT
SYR 20ML LL LF (SYRINGE) ×5 IMPLANT
SYR 50ML LL SCALE MARK (SYRINGE) ×5 IMPLANT
SYR CONTROL 10ML LL (SYRINGE) ×10 IMPLANT
SYR TOOMEY 50ML (SYRINGE) ×5 IMPLANT
TIP ENDOSCOPIC SURGICEL (TIP) ×5 IMPLANT
TIP UTERINE 6.7X8CM BLUE DISP (MISCELLANEOUS) IMPLANT
TRAY FOLEY SLVR 16FR LF STAT (SET/KITS/TRAYS/PACK) IMPLANT
TROCAR PORT AIRSEAL 8X120 (TROCAR) ×5 IMPLANT
WATER STERILE IRR 500ML POUR (IV SOLUTION) ×5 IMPLANT

## 2024-03-13 NOTE — Anesthesia Procedure Notes (Signed)
 Procedure Name: Intubation Date/Time: 03/13/2024 10:31 AM  Performed by: Elaine Delon CROME, CRNAPre-anesthesia Checklist: Patient identified, Emergency Drugs available, Suction available and Patient being monitored Patient Re-evaluated:Patient Re-evaluated prior to induction Oxygen Delivery Method: Circle system utilized Preoxygenation: Pre-oxygenation with 100% oxygen Induction Type: IV induction Ventilation: Mask ventilation without difficulty Laryngoscope Size: Mac and 3 Grade View: Grade I Tube type: Oral Tube size: 7.0 mm Number of attempts: 1 Airway Equipment and Method: Stylet and Oral airway Placement Confirmation: ETT inserted through vocal cords under direct vision, positive ETCO2 and breath sounds checked- equal and bilateral Secured at: 20 cm Tube secured with: Tape Dental Injury: Teeth and Oropharynx as per pre-operative assessment

## 2024-03-13 NOTE — Transfer of Care (Signed)
 Immediate Anesthesia Transfer of Care Note  Patient: [name]  Procedure(s) Performed: HYSTERECTOMY, TOTAL, LAPAROSCOPIC, ROBOT-ASSISTED WITH SALPINGECTOMY (Bilateral: Abdomen) LYSIS, ADHESIONS, ROBOT-ASSISTED, LAPAROSCOPIC (Abdomen) EXCISION, CYST, OVARY, ROBOT-ASSISTED, LAPAROSCOPIC (Left: Abdomen)  Patient Location: PACU  Anesthesia Type:General  Level of Consciousness: awake  Airway & Oxygen Therapy: Patient Spontanous Breathing and Patient connected to nasal cannula oxygen  Post-op Assessment: Report given to RN and Post -op Vital signs reviewed and stable  Post vital signs: Reviewed and stable  Last Vitals:  Vitals Value Taken Time  BP 137/91   Temp 98.1   Pulse 94 03/13/24 12:42  Resp 17   SpO2 96 % 03/13/24 12:42  Vitals shown include unfiled device data.  Last Pain:  Vitals:   03/13/24 0859  TempSrc: Oral  PainSc: 0-No pain      Patients Stated Pain Goal: 4 (03/13/24 0859)  Complications: No notable events documented.

## 2024-03-13 NOTE — Discharge Instructions (Addendum)
 Post Operative Pain Med Plan:  >Continue to take gabapentin  as you were taking to prior to surgery  >Take the Toradol  every 8 hours for the first 3 days.  After the toradol  is gone switch to Ibuprofen  600mg  every 6 hours as needed.  DO NOT TAKE the Toradol  and Ibuprofen  together- take one or the other.  Ibuprofen  is available over the counter or I can send in a prescription if needed.  >In between the Toradol , take Tylenol  (over the counter) every 6 to 8 hours.  If the Tylenol  does not seem strong enough.  Take the tylenol  along with the oxycodone  every 6 hours If the oxycodone  seems to strong then just take Tylenol   >Oxycodone  will cause constipation, please be sure to take a stool softener (Colace) twice daily while taking this pain medication and/or continue this medication until your bowel regimen returns to normal  If possible try to take the Toradol  or Ibuprofen  with food to help avoid upsetting your stomach  >Use a heating pad as well as needed  >For nausea if needed take zofran  (which you have already been prescribed) to take if needed over the first couple of days  >Be gentle with your diet the first few days, liquids and soft non spicy food, fruits are great  >Get up and move, no lifting or straining   HOME INSTRUCTIONS  Please note any unusual or excessive bleeding, pain, swelling. Mild dizziness or drowsiness are normal for about 24 hours after surgery.   Shower when comfortable  Restrictions: No driving for 24 hours or while taking pain medications.  Activity:  No heavy lifting (> 20 lbs), nothing in vagina (no tampons, douching, or intercourse) x 8 weeks; no tub baths for 8 weeks Vaginal spotting is expected but if your bleeding is heavy, period like,  please call the office   Incision: the bandaids will fall off when they are ready to; you may clean your incision with mild soap and water but do not rub or scrub the incision site.  You may experience slight bloody  drainage from your incision periodically.  This is normal.  If you experience a large amount of drainage or the incision opens, please call your physician who will likely direct you to the emergency department.  Diet:  You may return to your regular diet.  Do not eat large meals.  Eat small frequent meals throughout the day.  Continue to drink a good amount of water at least 6-8 glasses of water per day, hydration is very important for the healing process.  Pain Management: Follow instructions as above  Always take prescription pain medication with food.  Oxycodone  may cause constipation, you may want to take a stool softener while taking this medication.  A prescription of colace has been sent in to take twice daily if needed while taking the oxycodone .  Be sure to drink plenty of fluids and increase your fiber to help with constipation.  Alcohol  -- Avoid for 24 hours and while taking pain medications.  Nausea: Take sips of ginger ale or soda  Fever -- Call physician if temperature over 101 degrees  Follow up:  If you do not already have a follow up appointment scheduled, please call the office at [phone].  If you experience fever (a temperature greater than 100.4), pain unrelieved by pain medication, shortness of breath, swelling of a single leg, or any other symptoms which are concerning to you please the office immediately.

## 2024-03-13 NOTE — Anesthesia Preprocedure Evaluation (Addendum)
 Anesthesia Evaluation  Patient identified by MRN, date of birth, ID band Patient awake    Reviewed: Allergy & Precautions, H&P , NPO status , Patient's Chart, lab work & pertinent test results  History of Anesthesia Complications (+) PONV and history of anesthetic complications  Airway Mallampati: II  TM Distance: >3 FB Neck ROM: Full    Dental no notable dental hx.    Pulmonary sleep apnea    Pulmonary exam normal breath sounds clear to auscultation       Cardiovascular hypertension, Normal cardiovascular exam Rhythm:Regular Rate:Normal     Neuro/Psych  Headaches PSYCHIATRIC DISORDERS Anxiety Depression Bipolar Disorder      GI/Hepatic negative GI ROS, Neg liver ROS,,,  Endo/Other  negative endocrine ROS    Renal/GU Renal disease  negative genitourinary   Musculoskeletal negative musculoskeletal ROS (+)    Abdominal   Peds negative pediatric ROS (+)  Hematology  (+) Blood dyscrasia, anemia   Anesthesia Other Findings   Reproductive/Obstetrics negative OB ROS                              Anesthesia Physical Anesthesia Plan  ASA: 2  Anesthesia Plan: General   Post-op Pain Management:    Induction: Intravenous  PONV Risk Score and Plan: Scopolamine  patch - Pre-op  Airway Management Planned: Oral ETT  Additional Equipment:   Intra-op Plan:   Post-operative Plan: Extubation in OR  Informed Consent: I have reviewed the patients History and Physical, chart, labs and discussed the procedure including the risks, benefits and alternatives for the proposed anesthesia with the patient or authorized representative who has indicated his/her understanding and acceptance.     Dental advisory given  Plan Discussed with: CRNA  Anesthesia Plan Comments:          Anesthesia Quick Evaluation

## 2024-03-13 NOTE — Anesthesia Postprocedure Evaluation (Signed)
 Anesthesia Post Note  Patient: [name]  Procedure(s) Performed: HYSTERECTOMY, TOTAL, LAPAROSCOPIC, ROBOT-ASSISTED WITH SALPINGECTOMY (Bilateral: Abdomen) LYSIS, ADHESIONS, ROBOT-ASSISTED, LAPAROSCOPIC (Abdomen) EXCISION, CYST, OVARY, ROBOT-ASSISTED, LAPAROSCOPIC (Left: Abdomen)  Patient location during evaluation: PACU Anesthesia Type: General Level of consciousness: awake and alert Pain management: pain level controlled Vital Signs Assessment: post-procedure vital signs reviewed and stable Respiratory status: spontaneous breathing, nonlabored ventilation, respiratory function stable and patient connected to nasal cannula oxygen Cardiovascular status: blood pressure returned to baseline and stable Postop Assessment: no apparent nausea or vomiting Anesthetic complications: no   No notable events documented.   Last Vitals:  Vitals:   03/13/24 0859 03/13/24 1242  BP: 108/80 (!) 137/91  Pulse: 79   Resp: 20   Temp: 36.8 C 36.7 C  SpO2: 99% 98%    Last Pain:  Vitals:   03/13/24 1329  TempSrc:   PainSc: 6                  Andrea Limes

## 2024-03-13 NOTE — Op Note (Signed)
 Pre Op Dx:   1) Abnormal uterine bleeding 2) left ovarian cyst  Post Op Dx:   Same and pelvic adhesions  Procedure:   Robotic Assisted Total Laparoscopic Hysterectomy and Bilateral Salpingectomy, Drainage of left ovarian cyst, lysis of adhesions   Surgeon:  Dr. Delon Prude Anesthesia:  general   EBL:  10cc  IVF:  1300cc UOP:  75cc   Drains:  Foley catheter Specimen removed:  Uterus with cervix, bilateral fallopian tubes Findings:  Omental adhesions to anterior abdomen.  Normal sized uterus.  Prior tubal ligation, bilateral distal fallopian tubes still present.  Normal right ovary.  Enlarged left ovary with hemorrhagic cyst. Complications: None  Description of procedure:  After informed consent the patient was taken to the operating room and placed in dorsal supine position where general endotracheal anesthesia was administered and found to be adequate.  She was placed in dorsal lithotomy position with her arms tucked.  She was prepped and draped in the usual sterile fashion.  A timeout was called and the procedure confirmed.  A RUMI uterine manipulator with the Koh cup and a Foley catheter were placed.   An incision was made in the supraumbilical area and the Veress needle was inserted into the abdominal cavity without difficulty. Proper placement was confirmed using the saline drop test and opening pressure was 4 mmHg. A pneumoperitoneum was obtained. The laparoscopic trocar and the laparoscope were placed under direct visualization.  Two additional 7mm ports were placed on either side of the umbilicus and an 8mm port was placed in the left upper quadrant under direct visualization.  The patient was placed in Trendelenburg position and the [employer] robotic device was docked.  Next, attention was turned to the console where the hysterectomy was performed.   Attention was first turned to the omental adhesions which were taken down using the vessel sealer.  Bilateral ureters were  visualized.  The right fallopian tube was divided at the mesosalpinx and the uteroovarian anastamosis was divided and the right round ligament was divided. This process was repeated on the contralateral side.  The anterior leaflet of the broad ligament was divided to create a bladder flap. A small anterior and posterior colpotomy incision was made to mark the Koh cup. The uterine artery and vein were skeletonized and desiccated superior to the Koh cup.  This process was repeated on the contralateral side.  The colpotomy was completed along the ridge of the Koh cup and the uterus was passed off the field. The vaginal occluder was placed in the vagina to maintain pneumoperitoneum.  The vaginal cuff was then closed with Stratifix suture in a double layer fashion. Ureters with peristalsis were visualized bilaterally.  Attention was turned to the left ovary, a small incision was made with drainage of dark tan fluid.  Hemostasis was confirmed both within the ovary and vaginal cuff. Arista powder was placed on the vaginal cuff and adnexa bilaterally.  The Da Vinci robotic device was undocked.  Under direct visualization TAP block was completed under direct visualization using 10cc of 0.25% marcaine  in each of four locations.  Airseal was deflated and trocars were removed.The skin was closed with 4-0 Vicryl in subcuticular fashion with skin glue placed atop each port site.    The patient was returned to dorsal supine position, awakened and extubated in the OR having appeared to tolerate the procedure well.  All sponge, needle, and instrument counts were correct x 2 at the end of the case.  Pt tolerated  procedure well and was taken to recovery in stable condition.   Cindy Brindisi, DO Attending Obstetrician & Gynecologist, [facility] for [employer], [facility]

## 2024-03-14 ENCOUNTER — Ambulatory Visit: Payer: Self-pay | Admitting: Obstetrics & Gynecology

## 2024-03-14 ENCOUNTER — Encounter (HOSPITAL_COMMUNITY): Payer: Self-pay | Admitting: Obstetrics & Gynecology

## 2024-03-14 ENCOUNTER — Other Ambulatory Visit: Payer: Self-pay | Admitting: Obstetrics & Gynecology

## 2024-03-14 LAB — SURGICAL PATHOLOGY

## 2024-03-14 MED ORDER — CIPROFLOXACIN HCL 500 MG PO TABS: 500.0000 mg | ORAL_TABLET | Freq: Two times a day (BID) | ORAL | 0 refills | Status: AC

## 2024-03-14 NOTE — Progress Notes (Signed)
 Rx for Cipro - surgical prophylaxis  Willim Turnage, DO Attending Obstetrician & Gynecologist, [facility] for [employer], [facility]

## 2024-03-14 NOTE — Telephone Encounter (Signed)
 Pharmacy Patient Advocate Encounter  Received notification from [facility] MEDICAID that Prior Authorization for Candesartan  Cilexetil 8MG  tablets  has been DENIED.  Full denial letter will be uploaded to the media tab. See denial reason below.   PA #/Case ID/Reference #: [id number]

## 2024-03-20 ENCOUNTER — Encounter: Admitting: Obstetrics & Gynecology

## 2024-03-29 ENCOUNTER — Ambulatory Visit: Admitting: Obstetrics & Gynecology

## 2024-03-29 VITALS — BP 111/79 | HR 108 | Ht 66.0 in | Wt 195.6 lb

## 2024-03-29 DIAGNOSIS — R3989 Other symptoms and signs involving the genitourinary system: Secondary | ICD-10-CM

## 2024-03-29 DIAGNOSIS — Z48817 Encounter for surgical aftercare following surgery on the skin and subcutaneous tissue: Secondary | ICD-10-CM

## 2024-03-29 DIAGNOSIS — R112 Nausea with vomiting, unspecified: Secondary | ICD-10-CM

## 2024-03-29 DIAGNOSIS — Z4889 Encounter for other specified surgical aftercare: Secondary | ICD-10-CM

## 2024-03-29 MED ORDER — SCOPOLAMINE 1 MG/3DAYS TD PT72: 1.0000 | MEDICATED_PATCH | TRANSDERMAL | 0 refills | Status: AC

## 2024-03-29 NOTE — Progress Notes (Signed)
" ° ° °  PostOp Visit Note  [name] is a 38 y.o. [id number] female who presents for a postoperative visit. She is 1 week postop following a RAH, BS completed on 12/3   Today she notes that she has struggled with intermittent nausea/vomiting.  Some days she will be completely fine, other days will have considerable nausea.  Denies fever or chills.  +Flatus, Regular BMs.  Pain is manageable.    Pt states that she has had a lot going on- grandfather passed away and had planning funeral/lots of social stressors.      Review of Systems Pertinent items are noted in HPI.    Objective:  BP 111/79 (BP Location: Right Arm, Patient Position: Sitting, Cuff Size: Normal)   Pulse (!) 108   Ht 5' 6 (1.676 m)   Wt 195 lb 9.6 oz (88.7 kg)   LMP 03/05/2024 (Approximate)   BMI 31.57 kg/m    Physical Examination:  GENERAL ASSESSMENT: well developed and well nourished SKIN: warm and dry CHEST: normal air exchange, respiratory effort normal with no retractions HEART: regular rate and rhythm ABDOMEN: soft, non-distended, +BS, no rebound or guarding INCISION: C/D/I with appropriate ecchymosis Slight skin mottling noted along lower portion of abdomen EXTREMITY: no edema or calf tenderness bilaterally PSYCH: mood appropriate, normal affect       Assessment:    S/p RAH, BS Nausea/vomiting  Urinary discomfort  Plan:   - suspect Nausea/vomiting may be acute observation of IBS as patient has been having regular BMs and no acute abdominal signs on exam - Okay to continue Phenergan  and scope patch sent in to take as needed.   -advised adequate po intake and bland diet - Will continue to closely monitor and if no improvement over the next few days, patient to follow-up next week -Otherwise patient is meeting milestones appropriately, reviewed pelvic rest for the next 7 weeks  -UA and culture collected to r/o underlying infection -Follow-up in 7 weeks for postop visit   Pepper Kerrick, DO Attending  Obstetrician & Gynecologist, [facility] [employer], [facility]   "

## 2024-03-30 LAB — URINALYSIS, ROUTINE W REFLEX MICROSCOPIC
Bilirubin, UA: NEGATIVE
Glucose, UA: NEGATIVE
Ketones, UA: NEGATIVE
Leukocytes,UA: NEGATIVE
Nitrite, UA: NEGATIVE
Protein,UA: NEGATIVE
RBC, UA: NEGATIVE
Specific Gravity, UA: 1.011 (ref 1.005–1.030)
Urobilinogen, Ur: 0.2 mg/dL (ref 0.2–1.0)
pH, UA: 7 (ref 5.0–7.5)

## 2024-03-31 ENCOUNTER — Ambulatory Visit: Payer: Self-pay | Admitting: Obstetrics & Gynecology

## 2024-03-31 DIAGNOSIS — N3000 Acute cystitis without hematuria: Secondary | ICD-10-CM

## 2024-04-03 LAB — URINE CULTURE

## 2024-04-07 MED ORDER — NITROFURANTOIN MONOHYD MACRO 100 MG PO CAPS: 100.0000 mg | ORAL_CAPSULE | Freq: Two times a day (BID) | ORAL | 0 refills | Status: AC

## 2024-04-16 ENCOUNTER — Telehealth: Admitting: Physician Assistant

## 2024-04-16 DIAGNOSIS — R6889 Other general symptoms and signs: Secondary | ICD-10-CM | POA: Diagnosis not present

## 2024-04-16 DIAGNOSIS — Z20828 Contact with and (suspected) exposure to other viral communicable diseases: Secondary | ICD-10-CM | POA: Diagnosis not present

## 2024-04-16 MED ORDER — OSELTAMIVIR PHOSPHATE 75 MG PO CAPS: 75.0000 mg | ORAL_CAPSULE | Freq: Two times a day (BID) | ORAL | 0 refills | Status: AC

## 2024-04-16 NOTE — Progress Notes (Signed)
 E visit for Flu like symptoms   We are sorry that you are not feeling well.  Here is how we plan to help! Based on what you have shared with me it looks like you may have a respiratory virus that may be influenza.  Influenza or "the flu" is  an infection caused by a respiratory virus. The flu virus is highly contagious and persons who did not receive their yearly flu vaccination may "catch" the flu from close contact.  We have anti-viral medications to treat the viruses that cause this infection. They are not a "cure" and only shorten the course of the infection. These prescriptions are most effective when they are given within the first 2 days of "flu" symptoms. Antiviral medications are indicated if you have a high risk of complications from the flu. You should  also consider an antiviral medication if you are in close contact with someone who is at risk. These medications can help patients avoid complications from the flu but have side effects that you should know.   Possible side effects from Tamiflu  or oseltamivir  include nausea, vomiting, diarrhea, dizziness, headaches, eye redness, sleep problems or other respiratory symptoms. You should not take Tamiflu  if you have an allergy to oseltamivir  or any to the ingredients in Tamiflu .  Based upon your symptoms and potential risk factors I have prescribed Oseltamivir  (Tamiflu ).  It has been sent to your designated pharmacy.  You will take one 75 mg capsule orally twice a day for the next 5 days.   For nasal congestion, you may use an oral decongestant such as Mucinex D or if you have glaucoma or high blood pressure use plain Mucinex.  Saline nasal spray or nasal drops can help and can safely be used as often as needed for congestion.  If you have a sore or scratchy throat, use a saltwater gargle-  to  teaspoon of salt dissolved in a 4-ounce to 8-ounce glass of warm water .  Gargle the solution for approximately 15-30 seconds and then spit.  It is  important not to swallow the solution.  You can also use throat lozenges/cough drops and Chloraseptic spray to help with throat pain or discomfort.  Warm or cold liquids can also be helpful in relieving throat pain.  For headache, pain or general discomfort, you can use Ibuprofen  or Tylenol  as directed.   Some authorities believe that zinc sprays or the use of Echinacea may shorten the course of your symptoms.  You are to isolate at home until you have been fever-free for at least 24 hours without a fever-reducing medication, and symptoms have been steadily improving for 24 hours.  If you must be around other household members who do not have symptoms, you need to make sure that both you and the family members are masking consistently with a high-quality mask.  If you note any worsening of symptoms despite treatment, please seek an in-person evaluation ASAP. If you note any significant shortness of breath or any chest pain, please seek ED evaluation. Please do not delay care!  ANYONE WHO HAS FLU SYMPTOMS SHOULD: Stay home. The flu is highly contagious and going out or to work exposes others! Be sure to drink plenty of fluids. Water  is fine as well as fruit juices, sodas and electrolyte beverages. You may want to stay away from caffeine  or alcohol. If you are nauseated, try taking small sips of liquids. How do you know if you are getting enough fluid? Your urine should be a  pale yellow or almost colorless. Get rest. Taking a steamy shower or using a humidifier may help nasal congestion and ease sore throat pain. Using a saline nasal spray works much the same way. Cough drops, hard candies and sore throat lozenges may ease your cough. Line up a caregiver. Have someone check on you regularly.  GET HELP RIGHT AWAY IF: You cannot keep down liquids or your medications. You become short of breath Your fell like you are going to pass out or loose consciousness. Your symptoms persist after you have  completed your treatment plan  MAKE SURE YOU  Understand these instructions. Will watch your condition. Will get help right away if you are not doing well or get worse.  Your e-visit answers were reviewed by a board certified advanced clinical practitioner to complete your personal care plan.  Depending on the condition, your plan could have included both over the counter or prescription medications.  If there is a problem please reply  once you have received a response from your provider.  Your safety is important to us .  If you have drug allergies check your prescription carefully.    You can use MyChart to ask questions about today's visit, request a non-urgent call back, or ask for a work or school excuse for 24 hours related to this e-Visit. If it has been greater than 24 hours you will need to follow up with your provider, or enter a new e-Visit to address those concerns.  You will get an e-mail in the next two days asking about your experience.  I hope that your e-visit has been valuable and will speed your recovery. Thank you for using e-visits.   I have spent 5 minutes in review of e-visit questionnaire, review and updating patient chart, medical decision making and response to patient.   Elsie Velma Lunger, PA-C

## 2024-04-27 ENCOUNTER — Telehealth

## 2024-04-27 DIAGNOSIS — R509 Fever, unspecified: Secondary | ICD-10-CM

## 2024-04-27 DIAGNOSIS — R21 Rash and other nonspecific skin eruption: Secondary | ICD-10-CM

## 2024-04-28 NOTE — Progress Notes (Signed)
" °  Because of your fever and rash, I feel your condition warrants further evaluation and I recommend that you be seen in a face-to-face visit today! Given your symptoms, the sooner the better!   NOTE: There will be NO CHARGE for this E-Visit   If you are having a true medical emergency, please call 911.     For an urgent face to face visit, [place] has multiple urgent care centers for your convenience.  Click the link below for the full list of locations and hours, walk-in wait times, appointment scheduling options and driving directions:  Urgent Care - [place], [place], [place], [place], [place], [place]  [place]     Your MyChart E-visit questionnaire answers were reviewed by a board certified advanced clinical practitioner to complete your personal care plan based on your specific symptoms.    Thank you for using e-Visits.    "

## 2024-04-30 ENCOUNTER — Encounter (HOSPITAL_COMMUNITY): Payer: Self-pay

## 2024-04-30 ENCOUNTER — Other Ambulatory Visit: Payer: Self-pay

## 2024-04-30 ENCOUNTER — Emergency Department (HOSPITAL_COMMUNITY)

## 2024-04-30 ENCOUNTER — Emergency Department (HOSPITAL_COMMUNITY)
Admission: EM | Admit: 2024-04-30 | Discharge: 2024-05-01 | Disposition: A | Discharge status: Home / Self Care | Attending: Emergency Medicine | Admitting: Emergency Medicine

## 2024-04-30 DIAGNOSIS — R112 Nausea with vomiting, unspecified: Secondary | ICD-10-CM | POA: Insufficient documentation

## 2024-04-30 DIAGNOSIS — R1024 Suprapubic pain: Secondary | ICD-10-CM | POA: Diagnosis not present

## 2024-04-30 DIAGNOSIS — R42 Dizziness and giddiness: Secondary | ICD-10-CM | POA: Diagnosis present

## 2024-04-30 DIAGNOSIS — R197 Diarrhea, unspecified: Secondary | ICD-10-CM | POA: Diagnosis not present

## 2024-04-30 DIAGNOSIS — R21 Rash and other nonspecific skin eruption: Secondary | ICD-10-CM | POA: Diagnosis not present

## 2024-04-30 DIAGNOSIS — N390 Urinary tract infection, site not specified: Secondary | ICD-10-CM

## 2024-04-30 DIAGNOSIS — R509 Fever, unspecified: Secondary | ICD-10-CM | POA: Insufficient documentation

## 2024-04-30 DIAGNOSIS — Z91040 Latex allergy status: Secondary | ICD-10-CM | POA: Insufficient documentation

## 2024-04-30 DIAGNOSIS — R1084 Generalized abdominal pain: Secondary | ICD-10-CM

## 2024-04-30 LAB — URINALYSIS, ROUTINE W REFLEX MICROSCOPIC
Bilirubin Urine: NEGATIVE
Glucose, UA: NEGATIVE mg/dL
Ketones, ur: NEGATIVE mg/dL
Nitrite: NEGATIVE
Protein, ur: 100 mg/dL — AB
RBC / HPF: >50 RBC/hpf (ref 0–5)
Specific Gravity, Urine: 1.023 (ref 1.005–1.030)
pH: 5 (ref 5.0–8.0)

## 2024-04-30 LAB — COMPREHENSIVE METABOLIC PANEL WITH GFR
ALT: 29 U/L (ref 0–44)
AST: 16 U/L (ref 15–41)
Albumin: 4.7 g/dL (ref 3.5–5.0)
Alkaline Phosphatase: 62 U/L (ref 38–126)
Anion gap: 13 (ref 5–15)
BUN: 11 mg/dL (ref 6–20)
CO2: 26 mmol/L (ref 22–32)
Calcium: 9.9 mg/dL (ref 8.9–10.3)
Chloride: 100 mmol/L (ref 98–111)
Creatinine, Ser: 1.12 mg/dL — ABNORMAL HIGH (ref 0.44–1.00)
GFR, Estimated: >60 mL/min
Glucose, Bld: 92 mg/dL (ref 70–99)
Potassium: 3.2 mmol/L — ABNORMAL LOW (ref 3.5–5.1)
Sodium: 139 mmol/L (ref 135–145)
Total Bilirubin: 1.1 mg/dL (ref 0.0–1.2)
Total Protein: 6.7 g/dL (ref 6.5–8.1)

## 2024-04-30 LAB — CBC WITH DIFFERENTIAL/PLATELET
Abs Immature Granulocytes: 0.02 K/uL (ref 0.00–0.07)
Basophils Absolute: 0.1 K/uL (ref 0.0–0.1)
Basophils Relative: 1 %
Eosinophils Absolute: 0 K/uL (ref 0.0–0.5)
Eosinophils Relative: 1 %
HCT: 37.8 % (ref 36.0–46.0)
Hemoglobin: 13 g/dL (ref 12.0–15.0)
Immature Granulocytes: 0 %
Lymphocytes Relative: 38 %
Lymphs Abs: 2.2 K/uL (ref 0.7–4.0)
MCH: 31.9 pg (ref 26.0–34.0)
MCHC: 34.4 g/dL (ref 30.0–36.0)
MCV: 92.9 fL (ref 80.0–100.0)
Monocytes Absolute: 0.5 K/uL (ref 0.1–1.0)
Monocytes Relative: 8 %
Neutro Abs: 3 K/uL (ref 1.7–7.7)
Neutrophils Relative %: 52 %
Platelets: 239 K/uL (ref 150–400)
RBC: 4.07 MIL/uL (ref 3.87–5.11)
RDW: 12.2 % (ref 11.5–15.5)
WBC: 5.9 K/uL (ref 4.0–10.5)
nRBC: 0 % (ref 0.0–0.2)

## 2024-04-30 LAB — LIPASE, BLOOD: Lipase: 51 U/L (ref 11–51)

## 2024-04-30 MED ORDER — SODIUM CHLORIDE 0.9 % IV BOLUS
1000.0000 mL | Freq: Once | INTRAVENOUS | Status: AC
  Administered 2024-04-30: 1000 mL via INTRAVENOUS

## 2024-04-30 MED ORDER — IOHEXOL 300 MG/ML  SOLN
100.0000 mL | Freq: Once | INTRAMUSCULAR | Status: AC | PRN
  Administered 2024-04-30: 100 mL via INTRAVENOUS

## 2024-04-30 MED ORDER — METOCLOPRAMIDE HCL 5 MG/ML IJ SOLN
10.0000 mg | Freq: Once | INTRAMUSCULAR | Status: AC
  Administered 2024-04-30: 10 mg via INTRAVENOUS
  Filled 2024-04-30: qty 2

## 2024-04-30 MED ORDER — DIPHENHYDRAMINE HCL 50 MG/ML IJ SOLN
25.0000 mg | Freq: Once | INTRAMUSCULAR | Status: AC
  Administered 2024-04-30: 25 mg via INTRAVENOUS
  Filled 2024-04-30: qty 1

## 2024-04-30 NOTE — ED Provider Notes (Signed)
 " [name] [facility] AT [facility] Provider Note   CSN: [id number] Arrival date & time: 04/30/24  2139     Patient presents with: No chief complaint on file.   [name] is a 39 y.o. female.   Patient is a 39 year old female who presents to the emergency department with a chief complaint of abdominal pain, dizziness, nausea, vomiting, diarrhea which has been ongoing since her hysterectomy approximately a month and a half ago.  She notes that she has been on Zofran  and Phenergan  at home with no improvement in her symptoms.  She notes that she recently has been treated with antibiotics for a sinus infection, urinary tract infection, and was diagnosed with the flu.  Patient notes that she also has a rash over her abdomen which initially began on the lower abdomen and has now began to spread upwards.  She denies any associated chest pain or shortness of breath.  She does admit to intermittent fevers as well.        Prior to Admission medications  Medication Sig Start Date End Date Taking? Authorizing Provider  acetaminophen  (TYLENOL ) 325 MG tablet Take 2 tablets (650 mg total) by mouth every 6 (six) hours as needed for mild pain (pain score 1-3). 03/13/24   Ozan, Jennifer, DO  candesartan  (ATACAND ) 8 MG tablet Take 1 tablet (8 mg total) by mouth daily. 03/06/24   Sater, Charlie LABOR, MD  clindamycin  (CLEOCIN  T) 1 % SWAB Apply 1 Swab topically 2 (two) times daily as needed (skin blemishes).    [provider]  doxycycline  (VIBRAMYCIN ) 100 MG capsule Take 100 mg by mouth daily. 02/19/24   [provider]  Fremanezumab -vfrm (AJOVY ) 225 MG/1.5ML SOAJ Inject 225 mg into the skin every 30 (thirty) days. 06/09/23   Ines Onetha NOVAK, MD  gabapentin  (NEURONTIN ) 300 MG capsule Take 1 capsule (300 mg total) by mouth 4 (four) times daily. 12/28/23   Carvin Arvella HERO, MD  lamoTRIgine  (LAMICTAL ) 100 MG tablet Take 2 tablets (200 mg total) by mouth daily. 02/29/24  05/29/24  Carvin Arvella HERO, MD  linaclotide  (LINZESS ) 145 MCG CAPS capsule Take 1 capsule (145 mcg total) by mouth daily. 11/15/23 05/13/24  Cinderella Deatrice FALCON, MD  ondansetron  (ZOFRAN -ODT) 4 MG disintegrating tablet 4mg  ODT q4 hours prn nausea/vomit 02/22/24   Ozan, Jennifer, DO  polyethylene glycol (MIRALAX  / GLYCOLAX ) 17 g packet Take 17 g by mouth daily.    [provider]  promethazine  (PHENERGAN ) 12.5 MG tablet Take 1 tablet (12.5 mg total) by mouth every 6 (six) hours as needed for nausea or vomiting. 02/22/24   Ozan, Jennifer, DO  propranolol  (INDERAL ) 10 MG tablet Take 1 tablet (10 mg total) by mouth 3 (three) times daily as needed. 11/21/23   Carvin Arvella HERO, MD  psyllium (METAMUCIL) 58.6 % packet Take 1 packet by mouth daily.    [provider]  rizatriptan  (MAXALT -MLT) 10 MG disintegrating tablet Take 1 tablet (10 mg total) by mouth as needed for migraine. May repeat in 2 hours if needed 09/11/23   Ines Onetha NOVAK, MD  rosuvastatin  (CRESTOR ) 40 MG tablet Take 1 tablet (40 mg total) by mouth daily. 03/06/24   Severa Rock HERO, FNP  Semaglutide -Weight Management (WEGOVY ) 2.4 MG/0.75ML SOAJ Inject 2.4 mg into the skin once a week. 09/26/23   Ines Onetha NOVAK, MD  traZODone  (DESYREL ) 100 MG tablet Take 1 tablet (100 mg total) by mouth at bedtime. 12/28/23   Carvin Arvella HERO, MD  Vitamin D , Ergocalciferol , (DRISDOL ) 1.25 MG (50000 UNIT) CAPS capsule Take 1 capsule (50,000 Units total) by mouth every 7 (seven) days. 09/15/23   Severa Rock HERO, FNP  vortioxetine  HBr (TRINTELLIX ) 20 MG TABS tablet Take 1 tablet (20 mg total) by mouth daily. 02/29/24   Carvin Arvella HERO, MD    Allergies: Hydroquinone, Sodium hypochlorite, Keflex [cephalexin], and Latex    Review of Systems  Gastrointestinal:  Positive for abdominal pain, nausea and vomiting.  All other systems reviewed and are negative.   Updated Vital Signs BP 121/83   Pulse 90   Temp 98.2 F (36.8 C) (Temporal)   Resp 20   LMP  03/05/2024   SpO2 98%   Physical Exam Vitals and nursing note reviewed.  Constitutional:      General: She is not in acute distress.    Appearance: Normal appearance. She is not ill-appearing.  HENT:     Head: Normocephalic and atraumatic.     Nose: Nose normal.     Mouth/Throat:     Mouth: Mucous membranes are moist.  Eyes:     Extraocular Movements: Extraocular movements intact.     Conjunctiva/sclera: Conjunctivae normal.     Pupils: Pupils are equal, round, and reactive to light.  Cardiovascular:     Rate and Rhythm: Normal rate and regular rhythm.     Pulses: Normal pulses.     Heart sounds: Normal heart sounds. No murmur heard.    No gallop.  Pulmonary:     Effort: Pulmonary effort is normal. No respiratory distress.     Breath sounds: Normal breath sounds. No stridor. No wheezing, rhonchi or rales.  Abdominal:     General: Abdomen is flat. Bowel sounds are normal. There is no distension.     Palpations: Abdomen is soft.     Tenderness: There is no guarding.     Comments: Tender to palpation over lower abdomen and suprapubic region  Musculoskeletal:        General: Normal range of motion.     Cervical back: Normal range of motion and neck supple. No rigidity or tenderness.  Skin:    General: Skin is warm and dry.     Comments: Mottling rash noted over the abdomen with no areas of petechiae, purpura, bullae  Neurological:     General: No focal deficit present.     Mental Status: She is alert and oriented to person, place, and time. Mental status is at baseline.  Psychiatric:        Mood and Affect: Mood normal.        Behavior: Behavior normal.        Thought Content: Thought content normal.        Judgment: Judgment normal.     (all labs ordered are listed, but only abnormal results are displayed) Labs Reviewed  COMPREHENSIVE METABOLIC PANEL WITH GFR  LIPASE, BLOOD  CBC WITH DIFFERENTIAL/PLATELET  URINALYSIS, ROUTINE W REFLEX MICROSCOPIC     EKG: None  Radiology: No results found.   Procedures   Medications Ordered in the ED  sodium chloride  0.9 % bolus 1,000 mL (has no administration in time range)  metoCLOPramide  (REGLAN ) injection 10 mg (has no administration in time range)  diphenhydrAMINE  (BENADRYL ) injection 25 mg (has no administration in time range)  Medical Decision Making Amount and/or Complexity of Data Reviewed Labs: ordered. Radiology: ordered.  Risk Prescription drug management.   This patient presents to the ED for concern of abdominal pain, nausea, vomiting, rash differential diagnosis includes viral gastroenteritis, acute appendicitis, cholecystitis, small bowel obstruction, diverticulitis, ovarian torsion or cyst, PID, tubo-ovarian abscess, pyelonephritis, kidney stone, pancreatitis, mesenteric ischemia, colitis    Additional history obtained:  Additional history obtained from medical records External records from outside source obtained and reviewed including medical records   Lab Tests:  Pending   Imaging Studies ordered:  Pending   Medicines ordered and prescription drug management:  I ordered medication including Reglan , Benadryl , IV fluids for nausea and vomiting   Problem List / ED Course:  Patient is doing well at this time and does remain stable.  Workup is pending at this point.  Have ordered IV fluids as well as antiemetics.  Blood work and CT scan of the abdomen and pelvis is pending. rash to her abdomen is nonpruritic or painful in nature.  This was looked to be more ecchymotic and possibly from her recent surgery.  Will sign patient out to Dr. Wanita at shift change pending workup and reevaluation.   Social Determinants of Health:  None        Final diagnoses:  None    ED Discharge Orders     None          [name] 04/30/24 2301    Dean Clarity, MD 04/30/24 2342  "

## 2024-04-30 NOTE — ED Triage Notes (Signed)
 Pt states she had a hysterectomy in December and states she has had dizziness, abdominal pain and rash that started under belly button and now is spreading above that area. Pt states she can not keep any liquid down and states it is worse and she can not take it anymore.

## 2024-04-30 NOTE — ED Notes (Signed)
"  Pt in CT at this time     "

## 2024-05-01 MED ORDER — ONDANSETRON 4 MG PO TBDP: ORAL_TABLET | ORAL | 0 refills | Status: AC

## 2024-05-01 MED ORDER — CIPROFLOXACIN HCL 250 MG PO TABS
500.0000 mg | ORAL_TABLET | Freq: Once | ORAL | Status: AC
  Administered 2024-05-01: 500 mg via ORAL
  Filled 2024-05-01: qty 2

## 2024-05-01 MED ORDER — CIPROFLOXACIN HCL 500 MG PO TABS: 500.0000 mg | ORAL_TABLET | Freq: Two times a day (BID) | ORAL | 0 refills | Status: AC

## 2024-05-01 NOTE — ED Notes (Addendum)
 Dr Haze made aware of pt BP readings- pt family was concerned, informed them I would let Dr Haze know prior to discharge- no new orders, Dr Haze to talk with pt and family

## 2024-05-01 NOTE — ED Provider Notes (Signed)
 Patient signed out to me to follow-up on results.  Patient with recent hysterectomy, some bruising across the abdomen.  Patient complaining of nausea, vomiting, diarrhea.  CT scan does not show any acute complications of the surgery or any other acute pathology.  Urinalysis suggestive of infection.  Patient does have a culture from last year that was Enterococcus, sensitive to fluoroquinolones.  Initiate Cipro , reculture.  Patient improved after treatment here in the ED, will be discharged.   Haze Lonni PARAS, MD 05/01/24 [phone]

## 2024-05-02 LAB — URINE CULTURE: Culture: 60000 — AB

## 2024-05-03 ENCOUNTER — Telehealth (HOSPITAL_BASED_OUTPATIENT_CLINIC_OR_DEPARTMENT_OTHER): Payer: Self-pay

## 2024-05-07 ENCOUNTER — Encounter: Payer: Medicaid Other | Admitting: Family Medicine

## 2024-05-07 ENCOUNTER — Telehealth: Payer: Self-pay | Admitting: Pharmacist

## 2024-05-07 NOTE — Telephone Encounter (Signed)
 Pharmacy Patient Advocate Encounter   Received notification from CoverMyMeds that prior authorization for Emgality  is due for renewal.   Insurance verification completed.    Action: Medication has been discontinued. Archived Key: [id number]

## 2024-05-09 ENCOUNTER — Encounter: Payer: Self-pay | Admitting: Family Medicine

## 2024-05-09 ENCOUNTER — Encounter: Payer: Self-pay | Admitting: Adult Health

## 2024-05-10 ENCOUNTER — Telehealth: Admitting: Nurse Practitioner

## 2024-05-10 DIAGNOSIS — N3000 Acute cystitis without hematuria: Secondary | ICD-10-CM

## 2024-05-10 NOTE — Progress Notes (Signed)
 Advised pt she will need a visit for further management given recent diagnosis of UTI and possible allergic reaction to abx prescribed. Mychart message sent

## 2024-05-16 ENCOUNTER — Encounter: Payer: Self-pay | Admitting: Obstetrics & Gynecology

## 2024-05-17 ENCOUNTER — Encounter: Admitting: Obstetrics & Gynecology

## 2024-05-17 ENCOUNTER — Telehealth: Admitting: Student

## 2024-05-17 ENCOUNTER — Telehealth

## 2024-05-17 DIAGNOSIS — R399 Unspecified symptoms and signs involving the genitourinary system: Secondary | ICD-10-CM

## 2024-05-17 NOTE — Progress Notes (Signed)
" °  Because your symptoms are concerning for a complicated urinary tract infection or kidney infection, I feel your condition warrants further evaluation and I recommend that you be seen in a face-to-face visit.    NOTE: There will be NO CHARGE for this E-Visit   If you are having a true medical emergency, please call 911.     For an urgent face to face visit, [place] has multiple urgent care centers for your convenience.  Click the link below for the full list of locations and hours, walk-in wait times, appointment scheduling options and driving directions:  Urgent Care - [place], [place], [place], [place], [place], [place]  [place]     Your MyChart E-visit questionnaire answers were reviewed by a board certified advanced clinical practitioner to complete your personal care plan based on your specific symptoms.    Thank you for using e-Visits.    "

## 2024-05-23 ENCOUNTER — Telehealth (HOSPITAL_COMMUNITY): Admitting: Psychiatry

## 2024-06-04 ENCOUNTER — Encounter: Admitting: Obstetrics & Gynecology

## 2024-07-02 ENCOUNTER — Telehealth: Admitting: Adult Health

## 2024-09-11 ENCOUNTER — Encounter: Admitting: Family Medicine
